# Patient Record
Sex: Male | Born: 1990
Health system: Southern US, Community
[De-identification: ages and names within clinical notes are randomized; demographics above are authoritative.]

## PROBLEM LIST (undated history)

## (undated) DIAGNOSIS — F1910 Other psychoactive substance abuse, uncomplicated: Secondary | ICD-10-CM

## (undated) DIAGNOSIS — I10 Essential (primary) hypertension: Secondary | ICD-10-CM

## (undated) HISTORY — PX: APPENDECTOMY: SHX54

---

## 2013-10-16 ENCOUNTER — Encounter (HOSPITAL_BASED_OUTPATIENT_CLINIC_OR_DEPARTMENT_OTHER): Payer: Self-pay | Admitting: Emergency Medicine

## 2013-10-16 ENCOUNTER — Emergency Department (HOSPITAL_BASED_OUTPATIENT_CLINIC_OR_DEPARTMENT_OTHER)
Admission: EM | Admit: 2013-10-16 | Discharge: 2013-10-16 | Disposition: A | Discharge status: Home / Self Care | Payer: Self-pay | Attending: Emergency Medicine | Admitting: Emergency Medicine

## 2013-10-16 DIAGNOSIS — J069 Acute upper respiratory infection, unspecified: Secondary | ICD-10-CM | POA: Insufficient documentation

## 2013-10-16 DIAGNOSIS — K0889 Other specified disorders of teeth and supporting structures: Secondary | ICD-10-CM

## 2013-10-16 DIAGNOSIS — F17200 Nicotine dependence, unspecified, uncomplicated: Secondary | ICD-10-CM | POA: Insufficient documentation

## 2013-10-16 DIAGNOSIS — K089 Disorder of teeth and supporting structures, unspecified: Secondary | ICD-10-CM | POA: Insufficient documentation

## 2013-10-16 DIAGNOSIS — K002 Abnormalities of size and form of teeth: Secondary | ICD-10-CM | POA: Insufficient documentation

## 2013-10-16 HISTORY — DX: Other psychoactive substance abuse, uncomplicated: F19.10

## 2013-10-16 MED ORDER — NAPROXEN 500 MG PO TABS: 500.0000 mg | ORAL_TABLET | Freq: Two times a day (BID) | ORAL | Status: DC

## 2013-10-16 MED ORDER — PENICILLIN V POTASSIUM 500 MG PO TABS: 500.0000 mg | ORAL_TABLET | Freq: Four times a day (QID) | ORAL | Status: AC

## 2013-10-16 NOTE — ED Notes (Addendum)
Pt c/o dental pain and URI symptoms x 1 week pt is from [facility]

## 2013-10-16 NOTE — ED Provider Notes (Signed)
CSN: [id number]     Arrival date & time 10/16/13  1310 History   First MD Initiated Contact with Patient 10/16/13 1414     Chief Complaint  Patient presents with  . Dental Pain   (Consider location/radiation/quality/duration/timing/severity/associated sxs/prior Treatment) HPI Comments: Patient presents today from [facility] due to dental pain.  He reports that he has also had a cough and nasal congestion.  He reports that he has had left lower dental pain for the past week.  Pain gradually worsening.  He has not taken anything for the pain.  He denies any acute dental injury or trauma.  He denies facial swelling, fever, chills, trismus, or difficulty swallowing.  He reports that he currently does not have a dentist.  Patient also reports that he has had a productive cough and nasal congestion.  These symptoms began earlier today.  He denies fever, chills, SOB, or chest pain.  He reports that he also has not taken anything for these symptoms.  He currently smokes 1ppd.    Patient is a 22 y.o. male presenting with tooth pain. The history is provided by the patient.  Dental Pain   Past Medical History  Diagnosis Date  . Substance abuse    Past Surgical History  Procedure Laterality Date  . Appendectomy     History reviewed. No pertinent family history. History  Substance Use Topics  . Smoking status: Current Every Day Smoker -- 1.00 packs/day    Types: Cigarettes  . Smokeless tobacco: Not on file  . Alcohol Use: Yes     Comment: etoh abuse    Review of Systems  All other systems reviewed and are negative.    Allergies  Review of patient's allergies indicates no known allergies.  Home Medications  No current outpatient prescriptions on file. BP 147/82  Pulse 98  Temp(Src) 98.5 F (36.9 C) (Oral)  Resp 16  SpO2 100% Physical Exam  Nursing note and vitals reviewed. Constitutional: He is oriented to person, place, and time. He appears well-developed and well-nourished.  No distress.  HENT:  Head: Normocephalic and atraumatic.  Mouth/Throat: Uvula is midline, oropharynx is clear and moist and mucous membranes are normal. No trismus in the jaw. Abnormal dentition. No dental abscesses or uvula swelling. No oropharyngeal exudate, posterior oropharyngeal edema, posterior oropharyngeal erythema or tonsillar abscesses.  Poor dental hygiene. Pt able to open and close mouth with out difficulty. Airway intact. Uvula midline. Mild gingival swelling with tenderness over affected area, but no fluctuance. No swelling or tenderness of submental and submandibular regions.  Neck: Normal range of motion and full passive range of motion without pain. Neck supple.  Cardiovascular: Normal rate, regular rhythm and normal heart sounds.   Pulmonary/Chest: Effort normal and breath sounds normal. No stridor. No respiratory distress. He has no wheezes.  Abdominal: Soft. Bowel sounds are normal.  Musculoskeletal: Normal range of motion.  Lymphadenopathy:       Head (right side): No submental, no submandibular, no tonsillar, no preauricular and no posterior auricular adenopathy present.       Head (left side): No submental, no submandibular, no tonsillar, no preauricular and no posterior auricular adenopathy present.    He has no cervical adenopathy.  Neurological: He is alert and oriented to person, place, and time.  Skin: Skin is warm and dry. No rash noted. He is not diaphoretic.  Psychiatric: He has a normal mood and affect.    ED Course  Procedures (including critical care time) Labs Review Labs  Reviewed - No data to display Imaging Review No results found.  EKG Interpretation   None       MDM  No diagnosis found. Patient with toothache.  No gross abscess.  Exam unconcerning for Ludwig's angina or spread of infection.  Will treat with penicillin.  Patient instructed to use Ibuprofen or Tylenol for the pain.   Urged patient to follow-up with dentist.  Patient given dental  referral.  Patient also complaining of symptoms consistent with viral URI.  Patient is afebrile.  Pulse ox 100 on RA.  Lungs CTAB.  Cough just began earlier today.  Therefore, do not feel that CXR is indicated.  Patient stable for discharge.  Return precautions given.   Santiago Glad, PA-C 10/19/13 2020

## 2013-10-22 NOTE — ED Provider Notes (Signed)
Medical screening examination/treatment/procedure(s) were performed by non-physician practitioner and as supervising physician I was immediately available for consultation/collaboration.   Nelia Shi, MD 10/22/13 [phone]

## 2013-11-03 ENCOUNTER — Emergency Department (HOSPITAL_COMMUNITY)
Admission: EM | Admit: 2013-11-03 | Discharge: 2013-11-04 | Disposition: A | Discharge status: Home / Self Care | Payer: Self-pay | Attending: Emergency Medicine | Admitting: Emergency Medicine

## 2013-11-03 ENCOUNTER — Encounter (HOSPITAL_COMMUNITY): Payer: Self-pay | Admitting: Emergency Medicine

## 2013-11-03 DIAGNOSIS — F1910 Other psychoactive substance abuse, uncomplicated: Secondary | ICD-10-CM

## 2013-11-03 DIAGNOSIS — F17200 Nicotine dependence, unspecified, uncomplicated: Secondary | ICD-10-CM | POA: Insufficient documentation

## 2013-11-03 DIAGNOSIS — F1010 Alcohol abuse, uncomplicated: Secondary | ICD-10-CM | POA: Insufficient documentation

## 2013-11-03 DIAGNOSIS — F1410 Cocaine abuse, uncomplicated: Secondary | ICD-10-CM | POA: Insufficient documentation

## 2013-11-03 DIAGNOSIS — Z791 Long term (current) use of non-steroidal anti-inflammatories (NSAID): Secondary | ICD-10-CM | POA: Insufficient documentation

## 2013-11-03 DIAGNOSIS — F1110 Opioid abuse, uncomplicated: Secondary | ICD-10-CM | POA: Insufficient documentation

## 2013-11-03 LAB — COMPREHENSIVE METABOLIC PANEL
ALT: 136 U/L — ABNORMAL HIGH (ref 0–53)
AST: 63 U/L — ABNORMAL HIGH (ref 0–37)
Alkaline Phosphatase: 64 U/L (ref 39–117)
CO2: 24 mEq/L (ref 19–32)
GFR calc Af Amer: >90 mL/min (ref >90)
GFR calc non Af Amer: >90 mL/min (ref >90)
Glucose, Bld: 97 mg/dL (ref 70–99)
Potassium: 4.3 mEq/L (ref 3.7–5.3)
Sodium: 140 mEq/L (ref 137–147)
Total Protein: 7.7 g/dL (ref 6.0–8.3)

## 2013-11-03 LAB — CBC
Hemoglobin: 15.4 g/dL (ref 13.0–17.0)
MCHC: 33.9 g/dL (ref 30.0–36.0)
Platelets: 294 10*3/uL (ref 150–400)
RBC: 4.92 MIL/uL (ref 4.22–5.81)

## 2013-11-03 LAB — RAPID URINE DRUG SCREEN, HOSP PERFORMED
Amphetamines: NOT DETECTED
Barbiturates: NOT DETECTED
Cocaine: NOT DETECTED
Tetrahydrocannabinol: NOT DETECTED

## 2013-11-03 LAB — ACETAMINOPHEN LEVEL: Acetaminophen (Tylenol), Serum: <15 ug/mL (ref 10–30)

## 2013-11-03 NOTE — ED Notes (Signed)
Pt requesting detox from heroin, cocaine, etoh and benzos, relapsed this week after 60 days of being clean

## 2013-11-04 ENCOUNTER — Inpatient Hospital Stay (HOSPITAL_COMMUNITY)
Admission: EM | Admit: 2013-11-04 | Discharge: 2013-11-06 | DRG: 897 | Disposition: A | Discharge status: Home / Self Care | Payer: Federal, State, Local not specified - Other | Source: Intra-hospital | Attending: Psychiatry | Admitting: Psychiatry

## 2013-11-04 ENCOUNTER — Encounter (HOSPITAL_COMMUNITY): Payer: Self-pay | Admitting: *Deleted

## 2013-11-04 ENCOUNTER — Encounter (HOSPITAL_COMMUNITY): Payer: Self-pay

## 2013-11-04 DIAGNOSIS — Z79899 Other long term (current) drug therapy: Secondary | ICD-10-CM

## 2013-11-04 DIAGNOSIS — Z9289 Personal history of other medical treatment: Secondary | ICD-10-CM

## 2013-11-04 DIAGNOSIS — F1120 Opioid dependence, uncomplicated: Principal | ICD-10-CM | POA: Diagnosis present

## 2013-11-04 DIAGNOSIS — F4310 Post-traumatic stress disorder, unspecified: Secondary | ICD-10-CM | POA: Diagnosis present

## 2013-11-04 DIAGNOSIS — F3289 Other specified depressive episodes: Secondary | ICD-10-CM | POA: Diagnosis present

## 2013-11-04 DIAGNOSIS — F329 Major depressive disorder, single episode, unspecified: Secondary | ICD-10-CM

## 2013-11-04 MED ORDER — CLONIDINE HCL 0.1 MG PO TABS: 0.1000 mg | ORAL_TABLET | Freq: Every day | ORAL | Status: DC

## 2013-11-04 MED ORDER — NICOTINE 21 MG/24HR TD PT24
21.0000 mg | MEDICATED_PATCH | Freq: Every day | TRANSDERMAL | Status: DC
  Administered 2013-11-04 – 2013-11-06 (×3): 21 mg via TRANSDERMAL
  Filled 2013-11-04 (×6): qty 1

## 2013-11-04 MED ORDER — CHLORDIAZEPOXIDE HCL 25 MG PO CAPS
25.0000 mg | ORAL_CAPSULE | Freq: Four times a day (QID) | ORAL | Status: DC | PRN
  Administered 2013-11-04: 25 mg via ORAL
  Filled 2013-11-04: qty 1

## 2013-11-04 MED ORDER — LOPERAMIDE HCL 2 MG PO CAPS: 2.0000 mg | ORAL_CAPSULE | ORAL | Status: DC | PRN

## 2013-11-04 MED ORDER — PNEUMOCOCCAL VAC POLYVALENT 25 MCG/0.5ML IJ INJ
0.5000 mL | INJECTION | INTRAMUSCULAR | Status: AC
  Administered 2013-11-05: 0.5 mL via INTRAMUSCULAR

## 2013-11-04 MED ORDER — ONDANSETRON 4 MG PO TBDP: 4.0000 mg | ORAL_TABLET | Freq: Four times a day (QID) | ORAL | Status: DC | PRN

## 2013-11-04 MED ORDER — NAPROXEN 500 MG PO TABS: 500.0000 mg | ORAL_TABLET | Freq: Two times a day (BID) | ORAL | Status: DC | PRN

## 2013-11-04 MED ORDER — METHOCARBAMOL 500 MG PO TABS: 500.0000 mg | ORAL_TABLET | Freq: Three times a day (TID) | ORAL | Status: DC | PRN

## 2013-11-04 MED ORDER — LORAZEPAM 1 MG PO TABS: 0.0000 mg | ORAL_TABLET | Freq: Four times a day (QID) | ORAL | Status: DC

## 2013-11-04 MED ORDER — CLONIDINE HCL 0.1 MG PO TABS: 0.1000 mg | ORAL_TABLET | ORAL | Status: DC

## 2013-11-04 MED ORDER — MAGNESIUM HYDROXIDE 400 MG/5ML PO SUSP: 30.0000 mL | Freq: Every day | ORAL | Status: DC | PRN

## 2013-11-04 MED ORDER — CHLORDIAZEPOXIDE HCL 25 MG PO CAPS
25.0000 mg | ORAL_CAPSULE | Freq: Once | ORAL | Status: AC
Start: 2013-11-04 — End: 2013-11-04
  Administered 2013-11-04: 25 mg via ORAL
  Filled 2013-11-04: qty 1

## 2013-11-04 MED ORDER — LOPERAMIDE HCL 2 MG PO CAPS
2.0000 mg | ORAL_CAPSULE | ORAL | Status: DC | PRN
  Administered 2013-11-06: 4 mg via ORAL
  Filled 2013-11-04: qty 2

## 2013-11-04 MED ORDER — LORAZEPAM 1 MG PO TABS: 0.0000 mg | ORAL_TABLET | Freq: Two times a day (BID) | ORAL | Status: DC

## 2013-11-04 MED ORDER — DICYCLOMINE HCL 20 MG PO TABS
20.0000 mg | ORAL_TABLET | Freq: Four times a day (QID) | ORAL | Status: DC | PRN
  Administered 2013-11-06: 20 mg via ORAL
  Filled 2013-11-04: qty 1

## 2013-11-04 MED ORDER — ALUM & MAG HYDROXIDE-SIMETH 200-200-20 MG/5ML PO SUSP: 30.0000 mL | ORAL | Status: DC | PRN

## 2013-11-04 MED ORDER — INFLUENZA VAC SPLIT QUAD 0.5 ML IM SUSP
0.5000 mL | INTRAMUSCULAR | Status: DC
  Filled 2013-11-04: qty 0.5

## 2013-11-04 MED ORDER — ADULT MULTIVITAMIN W/MINERALS CH
1.0000 | ORAL_TABLET | Freq: Every day | ORAL | Status: DC
  Administered 2013-11-04 – 2013-11-06 (×3): 1 via ORAL
  Filled 2013-11-04 (×4): qty 1

## 2013-11-04 MED ORDER — HYDROXYZINE HCL 25 MG PO TABS: 25.0000 mg | ORAL_TABLET | Freq: Four times a day (QID) | ORAL | Status: DC | PRN

## 2013-11-04 MED ORDER — TRAZODONE HCL 50 MG PO TABS: 50.0000 mg | ORAL_TABLET | Freq: Every evening | ORAL | Status: DC | PRN

## 2013-11-04 MED ORDER — VITAMIN B-1 100 MG PO TABS
100.0000 mg | ORAL_TABLET | Freq: Every day | ORAL | Status: DC
  Administered 2013-11-05 – 2013-11-06 (×2): 100 mg via ORAL
  Filled 2013-11-04 (×3): qty 1

## 2013-11-04 MED ORDER — THIAMINE HCL 100 MG/ML IJ SOLN
100.0000 mg | Freq: Once | INTRAMUSCULAR | Status: AC
  Administered 2013-11-04: 100 mg via INTRAMUSCULAR
  Filled 2013-11-04: qty 2

## 2013-11-04 MED ORDER — CLONIDINE HCL 0.1 MG PO TABS: 0.1000 mg | ORAL_TABLET | Freq: Four times a day (QID) | ORAL | Status: DC

## 2013-11-04 MED ORDER — METHOCARBAMOL 500 MG PO TABS
500.0000 mg | ORAL_TABLET | Freq: Three times a day (TID) | ORAL | Status: DC | PRN
  Administered 2013-11-04: 500 mg via ORAL
  Filled 2013-11-04: qty 1

## 2013-11-04 MED ORDER — CLONIDINE HCL 0.1 MG PO TABS
0.1000 mg | ORAL_TABLET | ORAL | Status: DC
  Filled 2013-11-04: qty 1

## 2013-11-04 MED ORDER — HYDROXYZINE HCL 25 MG PO TABS
25.0000 mg | ORAL_TABLET | Freq: Four times a day (QID) | ORAL | Status: DC | PRN
  Administered 2013-11-04 – 2013-11-06 (×3): 25 mg via ORAL
  Filled 2013-11-04 (×3): qty 1

## 2013-11-04 MED ORDER — DULOXETINE HCL 20 MG PO CPEP
20.0000 mg | ORAL_CAPSULE | Freq: Every day | ORAL | Status: DC
  Administered 2013-11-04 – 2013-11-05 (×2): 20 mg via ORAL
  Filled 2013-11-04 (×5): qty 1

## 2013-11-04 MED ORDER — CLONIDINE HCL 0.1 MG PO TABS
0.1000 mg | ORAL_TABLET | Freq: Four times a day (QID) | ORAL | Status: DC
  Administered 2013-11-04 – 2013-11-05 (×4): 0.1 mg via ORAL
  Filled 2013-11-04 (×9): qty 1

## 2013-11-04 MED ORDER — ACETAMINOPHEN 325 MG PO TABS: 650.0000 mg | ORAL_TABLET | Freq: Four times a day (QID) | ORAL | Status: DC | PRN

## 2013-11-04 MED ORDER — ZOLPIDEM TARTRATE 10 MG PO TABS
10.0000 mg | ORAL_TABLET | Freq: Every evening | ORAL | Status: DC | PRN
  Administered 2013-11-04 – 2013-11-05 (×2): 10 mg via ORAL
  Filled 2013-11-04 (×2): qty 1

## 2013-11-04 MED ORDER — GABAPENTIN 100 MG PO CAPS
100.0000 mg | ORAL_CAPSULE | Freq: Three times a day (TID) | ORAL | Status: DC
  Administered 2013-11-04 – 2013-11-05 (×3): 100 mg via ORAL
  Filled 2013-11-04 (×7): qty 1

## 2013-11-04 MED ORDER — DICYCLOMINE HCL 20 MG PO TABS: 20.0000 mg | ORAL_TABLET | Freq: Four times a day (QID) | ORAL | Status: DC | PRN

## 2013-11-04 MED ORDER — CHLORDIAZEPOXIDE HCL 25 MG PO CAPS
25.0000 mg | ORAL_CAPSULE | Freq: Four times a day (QID) | ORAL | Status: DC | PRN
  Administered 2013-11-05 – 2013-11-06 (×3): 25 mg via ORAL
  Filled 2013-11-04 (×3): qty 1

## 2013-11-04 MED ORDER — NICOTINE 21 MG/24HR TD PT24
21.0000 mg | MEDICATED_PATCH | Freq: Every day | TRANSDERMAL | Status: DC
  Administered 2013-11-04: 21 mg via TRANSDERMAL
  Filled 2013-11-04: qty 1

## 2013-11-04 NOTE — Tx Team (Signed)
Initial Interdisciplinary Treatment Plan  PATIENT STRENGTHS: (choose at least two) General fund of knowledge  PATIENT STRESSORS: Substance abuse   PROBLEM LIST: Problem List/Patient Goals Date to be addressed Date deferred Reason deferred Estimated date of resolution  SA 11/04/13     Risk for suicide 11/04/13     Depression 11/04/13                                          DISCHARGE CRITERIA:  Withdrawal symptoms are absent or subacute and managed without 24-hour nursing intervention  PRELIMINARY DISCHARGE PLAN: Attend aftercare/continuing care group Attend PHP/IOP Attend 12-step recovery group  PATIENT/FAMIILY INVOLVEMENT: This treatment plan has been presented to and reviewed with the patient, Bryan Jennings.  The patient and family have been given the opportunity to ask questions and make suggestions.  Jacques Navy A 11/04/2013, 5:38 AM

## 2013-11-04 NOTE — [facility] Suicide Risk Assessment (Signed)
[facility] INPATIENT:  Family/Significant Other Suicide Prevention Education  Suicide Prevention Education:  Education Completed; Burwell Bethel (pt's brother) [phone] has been identified by the patient as the family member/significant other with whom the patient will be residing, and identified as the person(s) who will aid the patient in the event of a mental health crisis (suicidal ideations/suicide attempt).  With written consent from the patient, the family member/significant other has been provided the following suicide prevention education, prior to the and/or following the discharge of the patient.  The suicide prevention education provided includes the following:  Suicide risk factors  Suicide prevention and interventions  National Suicide Hotline telephone number  [facility] assessment telephone number  [facility] Emergency Assistance 911  [facility] and/or Residential Mobile Crisis Unit telephone number  Request made of family/significant other to:  Remove weapons (e.g., guns, rifles, knives), all items previously/currently identified as safety concern.    Remove drugs/medications (over-the-counter, prescriptions, illicit drugs), all items previously/currently identified as a safety concern.  The family member/significant other verbalizes understanding of the suicide prevention education information provided.  The family member/significant other agrees to remove the items of safety concern listed above.  Smart, HeatherLCSWA  11/04/2013, 1:05 PM

## 2013-11-04 NOTE — [facility] Group Notes (Signed)
[facility] LCSW Group Therapy  11/04/2013 3:11 PM  Type of Therapy:  Group Therapy  Participation Level:  Minimal  Participation Quality:  Attentive  Affect:  Depressed and Flat  Cognitive:  Alert  Insight:  Improving  Engagement in Therapy:  Improving  Modes of Intervention:  Confrontation, Discussion, Education, Exploration, Socialization and Support  Summary of Progress/Problems: Emotion Regulation: This group focused on both positive and negative emotion identification and allowed group members to process ways to identify feelings, regulate negative emotions, and find healthy ways to manage internal/external emotions. Group members were asked to reflect on a time when their reaction to an emotion led to a negative outcome and explored how alternative responses using emotion regulation would have benefited them. Group members were also asked to discuss a time when emotion regulation was utilized when a negative emotion was experienced. Bryan Jennings was attentive and engaged throughout today's therapy group. He stated that he has a difficult time dealing with "past traumas and anger toward family members." Bryan Jennings shared that he numbs his emotions with drugs and alcohol but was trying to get clean until he found out that his fiance was prostituting herself online. Bryan Jennings shared that he is ready to "surrender to my higher power and be accepting of help." Bryan Jennings shows progress in the group setting AEB his ability to process how his problems involving emotion regulation fuel his substance abuse and long term problems with relationships/self.    Smart, HeatherLCSWA  11/04/2013, 3:11 PM

## 2013-11-04 NOTE — [facility] Suicide Risk Assessment (Signed)
Suicide Risk Assessment  Admission Assessment     Nursing information obtained from:    Demographic factors:    Current Mental Status:    Loss Factors:    Historical Factors:    Risk Reduction Factors:     CLINICAL FACTORS:   Depression:   Comorbid alcohol abuse/dependence Alcohol/Substance Abuse/Dependencies  COGNITIVE FEATURES THAT CONTRIBUTE TO RISK:  Closed-mindedness Polarized thinking Thought constriction (tunnel vision)    SUICIDE RISK:   Moderate:  Frequent suicidal ideation with limited intensity, and duration, some specificity in terms of plans, no associated intent, good self-control, limited dysphoria/symptomatology, some risk factors present, and identifiable protective factors, including available and accessible social support.  PLAN OF CARE: Supportive approach/coping skills/relapse prevention                              Detox as needed                              CBT;mindfulness                              Reassess and address the co morbidities  I certify that inpatient services furnished can reasonably be expected to improve the patient's condition.  Saloma Cadena A 11/04/2013, 4:19 PM

## 2013-11-04 NOTE — ED Provider Notes (Signed)
CSN: [id number]     Arrival date & time 11/03/13  2155 History   First MD Initiated Contact with Patient 11/03/13 2323     Chief Complaint  Patient presents with  . Medical Clearance   HPI  History provided by the patient. Patient is a 22 year old male who presents with requests for help with detox from alcohol, heroin and cocaine. Patient has had issues with drug addictions recently going through rehabilitation treatments. He states he was clean for 2 months but relapsed this past week. He reports using and drinking alcohol every day. He last used IV heroine early in the morning. Has been having alcohol throughout the day. He denies any other complaints. Denies SI or HI. No other aggravating or alleviating factors. No other associated symptoms.    Past Medical History  Diagnosis Date  . Substance abuse    Past Surgical History  Procedure Laterality Date  . Appendectomy     History reviewed. No pertinent family history. History  Substance Use Topics  . Smoking status: Current Every Day Smoker -- 1.00 packs/day    Types: Cigarettes  . Smokeless tobacco: Not on file  . Alcohol Use: Yes     Comment: etoh abuse    Review of Systems  All other systems reviewed and are negative.    Allergies  Review of patient's allergies indicates no known allergies.  Home Medications   Current Outpatient Rx  Name  Route  Sig  Dispense  Refill  . naproxen (NAPROSYN) 500 MG tablet   Oral   Take 1 tablet (500 mg total) by mouth 2 (two) times daily.   30 tablet   0    BP 150/97  Pulse 102  Temp(Src) 97.7 F (36.5 C) (Oral)  Resp 20  Ht 5\' 10"  (1.778 m)  Wt 245 lb (111.131 kg)  BMI 35.15 kg/m2  SpO2 97% Physical Exam  Nursing note and vitals reviewed. Constitutional: He is oriented to person, place, and time. He appears well-developed and well-nourished. No distress.  HENT:  Head: Normocephalic.  Eyes: Conjunctivae are normal. Pupils are equal, round, and reactive to light.   Cardiovascular: Normal rate and regular rhythm.   Pulmonary/Chest: Effort normal and breath sounds normal. No respiratory distress. He has no wheezes.  Abdominal: Soft.  Neurological: He is alert and oriented to person, place, and time.  Skin: Skin is warm.    ED Course  Procedures   DIAGNOSTIC STUDIES: Oxygen Saturation is 97% on RA.    COORDINATION OF CARE:  Nursing notes reviewed. Vital signs reviewed. Initial pt interview and examination performed.   2:51 AM-patient seen and evaluated. Patient appears well no acute distress. Discussed work up plan with pt at bedside, which includes medical clearance and TTS consultation. Pt agrees with plan.  Patient is medically cleared for further evaluation by TTS.   Patient has been evaluated by TTS and is accepted over at [facility]   Results for orders placed during the hospital encounter of 11/03/13  ACETAMINOPHEN LEVEL      Result Value Range   Acetaminophen (Tylenol), Serum <15.0  10 - 30 ug/mL  CBC      Result Value Range   WBC 9.7  4.0 - 10.5 K/uL   RBC 4.92  4.22 - 5.81 MIL/uL   Hemoglobin 15.4  13.0 - 17.0 g/dL   HCT 40.3  47.4 - 25.9 %   MCV 92.3  78.0 - 100.0 fL   MCH 31.3  26.0 - 34.0 pg  MCHC 33.9  30.0 - 36.0 g/dL   RDW 16.1  09.6 - 04.5 %   Platelets 294  150 - 400 K/uL  COMPREHENSIVE METABOLIC PANEL      Result Value Range   Sodium 140  137 - 147 mEq/L   Potassium 4.3  3.7 - 5.3 mEq/L   Chloride 103  96 - 112 mEq/L   CO2 24  19 - 32 mEq/L   Glucose, Bld 97  70 - 99 mg/dL   BUN 10  6 - 23 mg/dL   Creatinine, Ser 4.09  0.50 - 1.35 mg/dL   Calcium 9.4  8.4 - 81.1 mg/dL   Total Protein 7.7  6.0 - 8.3 g/dL   Albumin 4.3  3.5 - 5.2 g/dL   AST 63 (*) 0 - 37 U/L   ALT 136 (*) 0 - 53 U/L   Alkaline Phosphatase 64  39 - 117 U/L   Total Bilirubin 0.6  0.3 - 1.2 mg/dL   GFR calc non Af Amer >90  >90 mL/min   GFR calc Af Amer >90  >90 mL/min  ETHANOL      Result Value Range   Alcohol, Ethyl (B) <11  0 - 11 mg/dL   SALICYLATE LEVEL      Result Value Range   Salicylate Lvl <2.0 (*) 2.8 - 20.0 mg/dL  URINE RAPID DRUG SCREEN (HOSP PERFORMED)      Result Value Range   Opiates POSITIVE (*) NONE DETECTED   Cocaine NONE DETECTED  NONE DETECTED   Benzodiazepines POSITIVE (*) NONE DETECTED   Amphetamines NONE DETECTED  NONE DETECTED   Tetrahydrocannabinol NONE DETECTED  NONE DETECTED   Barbiturates NONE DETECTED  NONE DETECTED     MDM   1. Polysubstance abuse        Angus Seller, [place] 11/04/13 [phone]

## 2013-11-04 NOTE — Clinical Social Work Note (Signed)
CSW met with Bryan Jennings individually. Pt stated that he is certain that he will be going back to [facility] on Tuesday. Pt made aware of possibility that he will be d/ced prior to Tuesday. Pt given shelter numbers and list of other [place] [facility]. Pt stated that he may be able to stay with friend/family member until Tuesday. He plans to go to [facility] for med management.   [employer], LCSWA 11/04/2013 3:36 PM

## 2013-11-04 NOTE — ED Notes (Signed)
No acute distress noted.

## 2013-11-04 NOTE — [facility] Group Notes (Signed)
[facility] LCSW Aftercare Discharge Planning Group Note   11/04/2013 9:33 AM  Participation Quality:  Appropriate   Mood/Affect:  Appropriate  Depression Rating:  7  Anxiety Rating:  10  Thoughts of Suicide:  No Will you contract for safety?   NA  Current AVH:  No  Plan for Discharge/Comments:  Pt reports that he recently completed treatment at [facility] but relapsed after finding out that his fiance was prostituting herself online. Pt reports that he will return to [facility] at d/c (he is able to return on Tuesday of next week). He plans to follow up at [facility] for med management.   Transportation Means: bus   Supports: family supports identified.   Smart, HeatherLCSWA

## 2013-11-04 NOTE — Tx Team (Signed)
Interdisciplinary Treatment Plan Update (Adult)  Date: 11/04/2013   Time Reviewed:12:56 PM  Progress in Treatment:  Attending groups: Yes.  Participating in groups:  Yes.  Taking medication as prescribed: Yes  Tolerating medication: Yes  Family/Significant othe contact made: Not yet. [name] required for this pt.   Patient understands diagnosis: Yes, AEB seeking treatment for SI, depression, ETOH detox/cocaine/benzo abuse/heroin abuse/detox.  Discussing patient identified problems/goals with staff: Yes  Medical problems stabilized or resolved: Yes  Denies suicidal/homicidal ideation: Yes during group/self report.  Patient has not harmed self or Others: Yes  New problem(s) identified:  Discharge Plan or Barriers: Pt plans to return to [facility] on Tuesday and follow up at [facility].  Additional comments:  Bryan Jennings is a 22 y.o. male who initially presented to [facility] requesting alcohol, cocaine, heroin, and benzos(xanax) detox. Pt states he relapsed on 11/01/13(Sunday) after being clean for 60 days. Pt had a recent 90 rehab admission with [facility] and was d/c'd on 10/27/13. Pt says he relapsed because he discovered his fiance was using "backpage"(advertising website for job listings, real estate, adult entertainment, etc) and pt became upset and immediately started using drugs--heroin, 1/2 gram, daily x3 days, last use was 11/03/13, pt used 1/2 gram; cocaine, daily x3 days, last use was 11/03/13, pt used $40 worth of the substance; alcohol, 12 beers, daily, last use was 11/03/13, pt drank 12 beers and pt uses 10-12 xanax pills, daily, last use was 11/03/13, pt used 6 pills. This Clinical research associate explained to pt that detox is not avail due to the length of use and pt.'s recent d/c from [facility] 1 week ago. Pt states that he was residing at the [facility] and had to leave because of his drug use, he was told he could return in 1 week after d/c from detox program. Pt cannot return to [facility] until  a 90 day absence for any additional treatment. Reason for Continuation of Hospitalization: Clonidine taper-withdrawals Mood stabilization Medication management  Estimated length of stay: 2-4 days  For review of initial/current patient goals, please see plan of care.  Attendees:  Patient:    Family:    Physician: Geoffery Lyons MD 11/04/2013 12:51 PM   Nursing: Lupita Leash RN   11/04/2013 12:51 PM   Clinical Social Worker Farra Nikolic Smart, LCSWA  11/04/2013 12:51 PM   Other: Maureen Ralphs RN  11/04/2013 12:51 PM   Other: Chandra Batch. PA 11/04/2013 12:51 PM   Other: Darden Dates Nurse CM  11/04/2013 12:51 PM   Other:    Scribe for Treatment Team:  Trula Slade LCSWA 11/04/2013 12:55 PM

## 2013-11-04 NOTE — Progress Notes (Signed)
D: Patient denies SI/HI and auditory and visual hallucinations. The patient has a depressed mood and affect. The patient open during interactions with staff. The patient states that is he is "doing OK". He reports sleep as fair, appetite good, energy level low, and ability to pay attention improving.  No withdrawal symptoms voices or notes.He rates depression at 7/ anxiety at 10/ and hopelessness at 10. (1 best).   A: Patient given emotional support from RN. Patient encouraged to come to staff with concerns and/or questions. Patient's medication routine continued. Patient's orders and plan of care reviewed.  R: Patient remains appropriate and cooperative but depressed. Patient denies any concerns or needs at this time. Will continue to monitor patient q15 minutes for safety

## 2013-11-04 NOTE — Progress Notes (Signed)
Patient ID: Bryan Jennings, male   DOB: Jul 31, 1991, 22 y.o.   MRN: [id number] Patients' were encouraged to reflect on 2014 and consider what they want to do differently in 2015. This patient stated: I plan to practice serenity and attend meetings.

## 2013-11-04 NOTE — Progress Notes (Signed)
Nutrition Brief Note  Patient identified on the Malnutrition Screening Tool (MST) Report.  Wt Readings from Last 10 Encounters:  11/04/13 239 lb (108.41 kg)  11/03/13 245 lb (111.131 kg)   Body mass index is 33.8 kg/(m^2). Patient meets criteria for class I obesity based on current BMI.   Discussed intake PTA with patient and compared to intake presently.  Discussed changes in intake, if any, and encouraged adequate intake of meals and snacks.  Current diet order is regular and pt is also offered choice of unit snacks mid-morning and mid-afternoon.  Pt is eating as desired.   Labs and medications reviewed. AST/ALT elevated.   Admitted with requests for help with detox from alcohol, heroin, an cocaine. Met with pt who reports eating well at [facility] but for the past week after he left he was eating poorly, only 1 meal/day due to not being hungry. States he lost 6 pounds in the past week. Reports eating excellent during admission. Plans on getting back on regular meal schedule after d/c as he reports he was getting light headed from eating so little PTA. Denies needing any nutritional supplements at this time.   Nutrition Dx:  Unintended wt change r/t suboptimal oral intake AEB pt report  Interventions:   Discussed the importance of nutrition and encouraged intake of food and beverages.     No additional nutrition interventions warranted at this time. If nutrition issues arise, please consult RD.   Levon Hedger MS, RD, LDN [phone] Pager [phone] After Hours Pager
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
## 2013-11-04 NOTE — Progress Notes (Signed)
Patient ID: Bryan Jennings, male   DOB: 1991-01-02, 22 y.o.   MRN: [id number] Pt attended Pharmacy group.

## 2013-11-04 NOTE — BH Assessment (Signed)
Assessment Note  Bryan Jennings is a 22 y.o. male who initially presented to [facility] requesting alcohol, cocaine, heroin, and benzos(xanax) detox.  Pt states he relapsed on 11/01/13(Sunday) after being clean for 60 days.  Pt had a recent 90 rehab admission with [facility] and was d/c'd on 10/27/13.  Pt says he relapsed because he discovered his fiance was using "backpage"(advertising website for job listings, real estate, adult entertainment, etc) and pt became upset and immediately started using drugs--heroin, 1/2 gram, daily x3 days, last use was 11/03/13, pt used 1/2 gram; cocaine, daily x3 days, last use was 11/03/13, pt used $40 worth of the substance; alcohol, 12 beers, daily, last use was 11/03/13, pt drank 12 beers and pt uses 10-12 xanax pills, daily, last use was 11/03/13, pt used 6 pills.  This Clinical research associate explained to pt that detox is not avail due to the length of use and pt.'s recent d/c from [facility] 1 week ago.  Pt states that he was residing at the [facility] and had to leave because of his drug use, he was told he could return in 1 week after d/c from detox program.  Pt cannot return to [facility] until a 90 day absence for any additional treatment.  Pt states he has nowhere to go and told this Clinical research associate that he was SI with plan to overdose. Pt has no w/d sxs.  Pt accepted by Donell Sievert, PA--304-2.    Axis I: Mood Disorder NOS and Opioid Abuse, Cocaine ABuse, Alcohol Abuse and Sedative, Hypnotic, or Anxiolytic Abuse  Axis II: Deferred Axis III:  Past Medical History  Diagnosis Date  . Substance abuse    Axis IV: other psychosocial or environmental problems, problems related to social environment and problems with primary support group Axis V: 41-50 serious symptoms  Past Medical History:  Past Medical History  Diagnosis Date  . Substance abuse     Past Surgical History  Procedure Laterality Date  . Appendectomy      Family History: History reviewed. No pertinent family history.  Social  History:  reports that he has been smoking Cigarettes.  He has been smoking about 1.00 pack per day. He does not have any smokeless tobacco history on file. He reports that he drinks alcohol. He reports that he uses illicit drugs (Cocaine, Heroin, and Benzodiazepines).  Additional Social History:  Alcohol / Drug Use Pain Medications: None  Prescriptions: None  Over the Counter: None Longest period of sobriety (when/how long): Recent 90 day rehab with [facility]; d/c'd on 10/27/13 Negative Consequences of Use: Personal relationships;Work / Programmer, multimedia Withdrawal Symptoms: Other (Comment) (No current w/d sxs )  CIWA: CIWA-Ar BP: 150/97 mmHg Pulse Rate: 102 COWS:    Allergies: No Known Allergies  Home Medications:  (Not in a hospital admission)  OB/GYN Status:  No LMP for male patient.  General Assessment Data Location of Assessment: [facility] ED Is this a Tele or Face-to-Face Assessment?: Tele Assessment Is this an Initial Assessment or a Re-assessment for this encounter?: Initial Assessment Living Arrangements: Other (Comment) (Pt was residing with [facility] until 11/03/13) Can pt return to current living arrangement?: Yes Admission Status: Voluntary Is patient capable of signing voluntary admission?: Yes Transfer from: Acute Hospital Referral Source: MD  Medical Screening Exam [facility] Walk-in ONLY) Medical Exam completed: No Reason for MSE not completed: Other: (None )  [facility] Crisis Care Plan Living Arrangements: Other (Comment) (Pt was residing with [facility] until 11/03/13) Name of Psychiatrist: None  Name of Therapist: None  Education Status Is patient currently in school?: No Current Grade: None  Highest grade of school patient has completed: None  Name of school: None  Contact person: None   Risk to self Suicidal Ideation: Yes-Currently Present Suicidal Intent: No Is patient at risk for suicide?: No Suicidal Plan?: Yes-Currently Present Specify Current Suicidal Plan:  Overdose  Access to Means: Yes Specify Access to Suicidal Means: Pills  What has been your use of drugs/alcohol within the last 12 months?: Abusing: heroin, cocaine, alcohol, benzos  Previous Attempts/Gestures: No How many times?: 0 Other Self Harm Risks: None  Triggers for Past Attempts: None known Intentional Self Injurious Behavior: None Family Suicide History: No Recent stressful life event(s): Other (Comment) (Recent relapse on drugs on 10/25/13) Persecutory voices/beliefs?: No Depression: Yes Depression Symptoms: Guilt Substance abuse history and/or treatment for substance abuse?: Yes Suicide prevention information given to non-admitted patients: Not applicable  Risk to Others Homicidal Ideation: No Thoughts of Harm to Others: No Current Homicidal Intent: No Current Homicidal Plan: No Access to Homicidal Means: No Identified Victim: None  History of harm to others?: No Assessment of Violence: None Noted Violent Behavior Description: None  Does patient have access to weapons?: No Criminal Charges Pending?: No Does patient have a court date: No  Psychosis Hallucinations: None noted Delusions: None noted  Mental Status Report Appear/Hygiene: Other (Comment) (Appropriate ) Eye Contact: Fair Motor Activity: Unremarkable Speech: Logical/coherent Level of Consciousness: Alert Mood: Sad Affect: Sad Anxiety Level: None Thought Processes: Coherent;Relevant Judgement: Impaired Orientation: Person;Place;Time;Situation Obsessive Compulsive Thoughts/Behaviors: None  Cognitive Functioning Concentration: Normal Memory: Recent Intact;Remote Intact IQ: Average Insight: Fair Impulse Control: Fair Appetite: Good Weight Loss: 0 Weight Gain: 0 Sleep: No Change Total Hours of Sleep: 6 Vegetative Symptoms: None  ADLScreening [facility] Assessment Services) Patient's cognitive ability adequate to safely complete daily activities?: Yes Patient able to express need for assistance  with ADLs?: Yes Independently performs ADLs?: Yes (appropriate for developmental age)  Prior Inpatient Therapy Prior Inpatient Therapy: Yes Prior Therapy Dates: 2014 Prior Therapy Facilty/Provider(s): [facility]  Reason for Treatment: Rehab/detox   Prior Outpatient Therapy Prior Outpatient Therapy: No Prior Therapy Dates: None  Prior Therapy Facilty/Provider(s): None  Reason for Treatment: None   ADL Screening (condition at time of admission) Patient's cognitive ability adequate to safely complete daily activities?: Yes Is the patient deaf or have difficulty hearing?: No Does the patient have difficulty seeing, even when wearing glasses/contacts?: No Does the patient have difficulty concentrating, remembering, or making decisions?: No Patient able to express need for assistance with ADLs?: Yes Does the patient have difficulty dressing or bathing?: No Independently performs ADLs?: Yes (appropriate for developmental age) Does the patient have difficulty walking or climbing stairs?: No Weakness of Legs: None Weakness of Arms/Hands: None  Home Assistive Devices/Equipment Home Assistive Devices/Equipment: None  Therapy Consults (therapy consults require a physician order) PT Evaluation Needed: No OT Evalulation Needed: No SLP Evaluation Needed: No Abuse/Neglect Assessment (Assessment to be complete while patient is alone) Physical Abuse: Denies Verbal Abuse: Denies Sexual Abuse: Denies Exploitation of patient/patient's resources: Denies Self-Neglect: Denies Values / Beliefs Cultural Requests During Hospitalization: None Spiritual Requests During Hospitalization: None Consults Spiritual Care Consult Needed: No Social Work Consult Needed: No Merchant navy officer (For Healthcare) Advance Directive: Patient does not have advance directive;Patient would not like information Pre-existing out of facility DNR order (yellow form or pink MOST form): No Nutrition Screen- [facility]  Adult/[facility] Patient's home diet: Regular  Additional Information 1:1 In Past 12 Months?: No CIRT  Risk: No Elopement Risk: No Does patient have medical clearance?: Yes     Disposition:  Disposition Initial Assessment Completed for this Encounter: Yes Disposition of Patient: Inpatient treatment program;Referred to [facility] ) Type of inpatient treatment program: Adult Patient referred to: Other (Comment) [facility] )  On Site Evaluation by:   Reviewed with Physician:    Murrell Redden 11/04/2013 2:35 AM
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
## 2013-11-04 NOTE — Progress Notes (Signed)
Patient ID: Bryan Jennings, male   DOB: 12-26-1990, 22 y.o.   MRN: [id number]  Admission Note:  D:22 yr male who presents VC in no acute distress for the treatment of SA/ ETOH Detox,  SI and Depression. Pt appears flat and depressed. Pt was calm and cooperative with admission process. Pt presents with passive SI and contracts for safety upon admission. Pt denies AVH .  A:Skin was assessed and found to be clear of any abnormal marks apart from a scar on L/R forearms from drug use, Rash on chest, birthmark on R eye, Tattoos on L-arm R-shoulder back. POC and unit policies explained and understanding verbalized. Consents obtained. Food and fluids offered, and  Accepted.  R: Pt had no additional questions or concerns.

## 2013-11-04 NOTE — ED Notes (Signed)
[employer] contacted. 

## 2013-11-04 NOTE — ED Notes (Signed)
Pt with 2 belonging bags consist of black hoodie jacket, white tshirt, white tank top tshirt, blue jeans, brown belt, white socks, black sneakers, black cap, 1 ring, 2 bracelts,and  black boxers.  Keys and 1 cig in [place]

## 2013-11-05 LAB — HEPATITIS PANEL, ACUTE
HCV Ab: REACTIVE — AB
Hep A IgM: NONREACTIVE
Hep B C IgM: NONREACTIVE
Hepatitis B Surface Ag: NEGATIVE

## 2013-11-05 MED ORDER — GABAPENTIN 300 MG PO CAPS
300.0000 mg | ORAL_CAPSULE | Freq: Three times a day (TID) | ORAL | Status: DC
  Administered 2013-11-05 – 2013-11-06 (×3): 300 mg via ORAL
  Filled 2013-11-05 (×6): qty 1

## 2013-11-05 MED ORDER — DULOXETINE HCL 30 MG PO CPEP
30.0000 mg | ORAL_CAPSULE | Freq: Every day | ORAL | Status: DC
  Administered 2013-11-06: 30 mg via ORAL
  Filled 2013-11-05 (×2): qty 1

## 2013-11-05 NOTE — Progress Notes (Signed)
The focus of this group is to educate the patient on the purpose and policies of crisis stabilization and provide a format to answer questions about their admission.  The group details unit policies and expectations of patients while admitted.  Patient attended 0900 nurse education orientation group this morning.  Patient listened, appropriate affect, alert, appropriate insight and engagement.  Today patient will work on 3 goals for discharge.  

## 2013-11-05 NOTE — ED Provider Notes (Signed)
Medical screening examination/treatment/procedure(s) were performed by non-physician practitioner and as supervising physician I was immediately available for consultation/collaboration.    Ravis Herne, MD 11/05/13 0958 

## 2013-11-05 NOTE — Progress Notes (Signed)
Pt attended AA group 

## 2013-11-05 NOTE — Progress Notes (Signed)
D:  Patient's self inventory sheet, patient has fair sleep, improving appetite, low energy level, poor attention span.  Rated depression 5, hopeless 4, anxiety 8.  Has experienced tremors, diarrhea, chilling, cravings, agitation in past 24 hours.  Denied SI.  Has felt lightheaded and pain in past 24 hours.  Worst pain 8.  Plans to change people he sees after discharge.   A:  Medications administered per MD orders.  Emotional support and encouragement given patient. R:  Denied SI and HI.  Denied A/V hallucinations.  Denied pain.  Will continue to monitor for safety with 15 minute checks.  Safety maintained.

## 2013-11-05 NOTE — Progress Notes (Signed)
[facility] MD Progress Note  11/05/2013 4:12 PM Bryan Jennings  MRN:  [id number] Subjective:  States he is worried about his daughter's well being. He admits to persistent anxiety, and depression. His Hep C screening was positive. States he did not know he had it, but suspected it as his brother has it and he had shared needles with him. His brother wants him to go to a long term program. He does not want to do this.  Diagnosis:   DSM5: Schizophrenia Disorders:  none Obsessive-Compulsive Disorders:  none Trauma-Stressor Disorders:  Posttraumatic Stress Disorder (309.81) Substance/Addictive Disorders:  Cannabis Use Disorder - Moderate 9304.30) and Opioid Disorder - Moderate (304.00), Cocaine use Disorder Depressive Disorders:  Major Depressive Disorder - Moderate (296.22)  Axis I: Anxiety Disorder NOS  ADL's:  Intact  Sleep: Fair  Appetite:  Fair  Suicidal Ideation:  Plan:  denies Intent:  denies Means:  denies Homicidal Ideation:  Plan:  denies Intent:  denies Means:  denies AEB (as evidenced by):  Psychiatric Specialty Exam: Review of Systems  Constitutional: Negative.   HENT: Negative.   Eyes: Negative.   Respiratory: Negative.   Cardiovascular: Negative.   Gastrointestinal: Negative.   Genitourinary: Negative.   Musculoskeletal: Negative.   Skin: Negative.   Neurological: Negative.   Endo/Heme/Allergies: Negative.   Psychiatric/Behavioral: Positive for depression and substance abuse.    Blood pressure 106/68, pulse 76, temperature 98 F (36.7 C), temperature source Oral, resp. rate 16, height 5' 10.5" (1.791 m), weight 108.41 kg (239 lb).Body mass index is 33.8 kg/(m^2).  General Appearance: Fairly Groomed  Patent attorney::  Minimal  Speech:  Clear and Coherent, Slow and not spontaneous  Volume:  Decreased  Mood:  Anxious, Depressed and worried  Affect:  Restricted  Thought Process:  Coherent and Goal Directed  Orientation:  Full (Time, Place, and Person)  Thought  Content:  symptoms, worries, concerns  Suicidal Thoughts:  No  Homicidal Thoughts:  No  Memory:  Immediate;   Fair Recent;   Fair Remote;   Fair  Judgement:  Fair  Insight:  Present  Psychomotor Activity:  Restlessness  Concentration:  Fair  Recall:  Fair  Akathisia:  NA  Handed:    AIMS (if indicated):     Assets:  Desire for Improvement  Sleep:  Number of Hours: 5.75   Current Medications: Current Facility-Administered Medications  Medication Dose Route Frequency Provider Last Rate Last Dose  . acetaminophen (TYLENOL) tablet 650 mg  650 mg Oral Q6H PRN Kerry Hough, PA-C      . alum & mag hydroxide-simeth (MAALOX/MYLANTA) 200-200-20 MG/5ML suspension 30 mL  30 mL Oral Q4H PRN Kerry Hough, PA-C      . chlordiazePOXIDE (LIBRIUM) capsule 25 mg  25 mg Oral Q6H PRN Sanjuana Kava, NP   25 mg at 11/05/13 1253  . dicyclomine (BENTYL) tablet 20 mg  20 mg Oral Q6H PRN Sanjuana Kava, NP      . DULoxetine (CYMBALTA) DR capsule 20 mg  20 mg Oral Daily Rachael Fee, MD   20 mg at 11/05/13 0904  . gabapentin (NEURONTIN) capsule 300 mg  300 mg Oral TID Rachael Fee, MD   300 mg at 11/05/13 1146  . hydrOXYzine (ATARAX/VISTARIL) tablet 25 mg  25 mg Oral Q6H PRN Sanjuana Kava, NP   25 mg at 11/05/13 1149  . influenza vac split quadrivalent PF (FLUARIX) injection 0.5 mL  0.5 mL Intramuscular Tomorrow-1000 Rachael Fee, MD      .  loperamide (IMODIUM) capsule 2-4 mg  2-4 mg Oral PRN Sanjuana Kava, NP      . loperamide (IMODIUM) capsule 2-4 mg  2-4 mg Oral PRN Sanjuana Kava, NP      . magnesium hydroxide (MILK OF MAGNESIA) suspension 30 mL  30 mL Oral Daily PRN Kerry Hough, PA-C      . methocarbamol (ROBAXIN) tablet 500 mg  500 mg Oral Q8H PRN Sanjuana Kava, NP   500 mg at 11/04/13 2147  . multivitamin with minerals tablet 1 tablet  1 tablet Oral Daily Sanjuana Kava, NP   1 tablet at 11/05/13 0347  . naproxen (NAPROSYN) tablet 500 mg  500 mg Oral BID PRN Sanjuana Kava, NP      . nicotine  (NICODERM CQ - dosed in mg/24 hours) patch 21 mg  21 mg Transdermal Q0600 Kerry Hough, PA-C   21 mg at 11/05/13 0906  . ondansetron (ZOFRAN-ODT) disintegrating tablet 4 mg  4 mg Oral Q6H PRN Sanjuana Kava, NP      . ondansetron (ZOFRAN-ODT) disintegrating tablet 4 mg  4 mg Oral Q6H PRN Sanjuana Kava, NP      . thiamine (VITAMIN B-1) tablet 100 mg  100 mg Oral Daily Sanjuana Kava, NP   100 mg at 11/05/13 4259  . zolpidem (AMBIEN) tablet 10 mg  10 mg Oral QHS PRN Rachael Fee, MD   10 mg at 11/04/13 2148    Lab Results:  Results for orders placed during the hospital encounter of 11/04/13 (from the past 48 hour(s))  HEPATITIS PANEL, ACUTE     Status: Abnormal   Collection Time    11/04/13  7:33 PM      Result Value Range   Hepatitis B Surface Ag NEGATIVE  NEGATIVE   HCV Ab Reactive (*) NEGATIVE   Comment: (NOTE)                                                                               This test is for screening purposes only.  Reactive results should be     confirmed by an alternative method.  Suggest HCV Qualitative, PCR,     test code 56387.  Specimens will be stable for reflex testing up to 3     days after collection.   Hep A IgM NON REACTIVE  NON REACTIVE   Hep B C IgM NON REACTIVE  NON REACTIVE   Comment: (NOTE)     High levels of Hepatitis B Core IgM antibody are detectable     during the acute stage of Hepatitis B. This antibody is used     to differentiate current from past HBV infection.     Performed at [facility]    Physical Findings: AIMS: Facial and Oral Movements Muscles of Facial Expression: None, normal Lips and Perioral Area: None, normal Jaw: None, normal Tongue: None, normal,Extremity Movements Upper (arms, wrists, hands, fingers): None, normal Lower (legs, knees, ankles, toes): None, normal, Trunk Movements Neck, shoulders, hips: None, normal, Overall Severity Severity of abnormal movements (highest score from questions above): None,  normal Incapacitation due to abnormal movements: None, normal Patient's awareness of  abnormal movements (rate only patient's report): No Awareness, Dental Status Current problems with teeth and/or dentures?: No Does patient usually wear dentures?: No  CIWA:  CIWA-Ar Total: 2 COWS:  COWS Total Score: 3  Treatment Plan Summary: Daily contact with patient to assess and evaluate symptoms and progress in treatment Medication management  Plan: Supportive approach/coping skills/relapse prevention           CBT;mindfulness           Increase the Neurontin to 300 mg TID           Increase the Cymbalta 30 mg dialy  Medical Decision Making Problem Points:  New problem, with additional work-up planned (4) and Review of psycho-social stressors (1) Data Points:  Review of medication regiment & side effects (2) Review of new medications or change in dosage (2)  I certify that inpatient services furnished can reasonably be expected to improve the patient's condition.   Fenris Cauble A 11/05/2013, 4:12 PM

## 2013-11-05 NOTE — Progress Notes (Signed)
Adult Psychoeducational Group Note  Date:  11/05/2013 Time:  9:15AM Group Topic/Focus:  Nursing Group  Participation Level:  Active  Participation Quality:  Appropriate and Attentive  Affect:  Appropriate  Cognitive:  Alert and Appropriate  Insight: Appropriate  Engagement in Group:  Engaged  Modes of Intervention:  Discussion  Additional Comments:  Pt was attentive anda appropriate during today's morning Wellness group with nursing staff.    Bing PlumeScott, Niklas Chretien D 11/05/2013, 1:11 PM

## 2013-11-05 NOTE — Progress Notes (Addendum)
Patient ID: Bryan Jennings, male   DOB: 20-Jan-1991, 23 y.o.   MRN: [id number] D: Pt is anxious in affect and depressed in mood. Pt is reporting anxiety, restlessness, and insomnia. Pt's meds were effective in managing his concerns. Pt was present for group this evening.Pt observed interacting appropriately within the milieu.  Pt is denying any suicidal ideation.  A: Writer administered scheduled and prn medications to pt. Continued support and availability as needed was extended to this pt. Staff continue to monitor pt with q4915min checks.  R: No adverse drug reactions noted. Pt receptive to treatment. Pt remains safe at this time.

## 2013-11-06 MED ORDER — HYDROXYZINE HCL 25 MG PO TABS
25.0000 mg | ORAL_TABLET | Freq: Three times a day (TID) | ORAL | Status: DC
  Filled 2013-11-06 (×3): qty 42

## 2013-11-06 MED ORDER — ZOLPIDEM TARTRATE 10 MG PO TABS: 10.0000 mg | ORAL_TABLET | Freq: Every evening | ORAL | Status: DC | PRN

## 2013-11-06 MED ORDER — NAPROXEN 500 MG PO TABS: ORAL_TABLET | ORAL | Status: DC

## 2013-11-06 MED ORDER — GABAPENTIN 300 MG PO CAPS: 300.0000 mg | ORAL_CAPSULE | Freq: Three times a day (TID) | ORAL | Status: DC

## 2013-11-06 MED ORDER — DULOXETINE HCL 30 MG PO CPEP: 30.0000 mg | ORAL_CAPSULE | Freq: Every day | ORAL | Status: DC

## 2013-11-06 MED ORDER — HYDROXYZINE HCL 25 MG PO TABS: ORAL_TABLET | ORAL | Status: DC

## 2013-11-06 NOTE — Tx Team (Signed)
Interdisciplinary Treatment Plan Update (Adult)  Date: 11/06/2013  Time Reviewed:  9:45 AM  Progress in Treatment: Attending groups: Yes Participating in groups:  Yes Taking medication as prescribed:  Yes Tolerating medication:  Yes Family/Significant othe contact made: Yes Patient understands diagnosis:  Yes Discussing patient identified problems/goals with staff:  Yes Medical problems stabilized or resolved:  Yes Denies suicidal/homicidal ideation: Yes Issues/concerns per patient self-inventory:  Yes Other:  New problem(s) identified: N/A  Discharge Plan or Barriers: [facility] and therapy.    Reason for Continuation of Hospitalization: Stable to d/c today  Comments: N/A  Estimated length of stay: D/C today  For review of initial/current patient goals, please see plan of care.  Attendees: Patient:     Family:     Physician:  Dr. Dub MikesLugo 11/06/2013 10:17 AM   Nursing:   Manuela SchwartzJennifer Pritchett, RN 11/06/2013 10:17 AM   Clinical Social Worker:  Reyes Ivanhelsea Horton, LCSW 11/06/2013 10:17 AM   Other: Dellia CloudAndrea Thorne, RN 11/06/2013 10:17 AM   Other: Mordecai RasmussenHannah Coble, LCSW 11/06/2013 10:17 AM   Other:  Serena ColonelAggie Nwoko, NP 11/06/2013 10:17 AM   Other:     Other:    Other:    Other:    Other:    Other:    Other:     Scribe for Treatment Team:   Carmina MillerHorton, Malayasia Mirkin Nicole, 11/06/2013 , 10:17 AM
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
## 2013-11-06 NOTE — Progress Notes (Signed)
D/C instructions/meds/follow-up appointments reviewed, pt verbalized understanding, pt's belongings returned to pt, samples given. 

## 2013-11-06 NOTE — Progress Notes (Signed)
D    Pt attended groups this evening   He appears sad and depressed   He was somewhat irritable when asking for his medications   He reports difficulty sleeping and increased anxiety A   Verbal support given   Medications administered and effectivness monitored   Offered suggestions for better sleep   Monitor Q 15 min R    Pt is safe at present

## 2013-11-06 NOTE — [facility] Group Notes (Signed)
[facility] Note   11/06/2013 8:45 AM  Participation Quality:  Alert, Appropriate and Oriented  Mood/Affect:  Calm  Depression Rating:  1  Anxiety Rating:  7  Thoughts of Suicide:  Pt denies SI/HI  Will you contract for safety?   Yes  Current AVH:  Pt denies  Plan for Discharge/Comments:  Pt attended discharge planning group and actively participated in group.  CSW provided pt with today's workbook.  Pt reports feeling stable to d/c today.  Pt [place] stay with a cousin in [place] until he is able to get back into an [facility].  [facility]  No further needs voiced by pt at this time.    Transportation Means: Pt reports access to transportation - provided pt with a bus pass and PART money  Supports: No supports mentioned at this time  Reyes IvanChelsea Horton, LCSW 11/06/2013 9:32 AM

## 2013-11-06 NOTE — Progress Notes (Addendum)
[facility] :  Will you be returning to the same living situation after discharge: Yes,  staying with a cousin until he can get back into an [facility] At discharge, do you have transportation home?:Yes,  provided pt with a bus pass and PART money Do you have the ability to pay for your medications:Yes,  access to meds  Release of information consent forms completed and in the chart;  Patient's signature needed at discharge.  Patient to Follow up at: Follow-up Information   Follow up with [facility] On 11/09/2013. (Walk in between 8am-9am Monday through Friday for hospital followup/medication management/assessment for therapy services. )    Contact information:   [address] [place], [place] [address] Phone: [phone] Fax: [phone]      Patient denies SI/HI:   Yes,  denies SI/HI    Safety Planning and Suicide Prevention discussed:  Yes,  discussed with pt and pt's brother.  See suicide prevention education note  Horton, Salome ArntChelsea Nicole 11/06/2013, 10:19 AM

## 2013-11-10 ENCOUNTER — Emergency Department (HOSPITAL_COMMUNITY)
Admission: EM | Admit: 2013-11-10 | Discharge: 2013-11-11 | Disposition: A | Discharge status: Rehabilitation Facility | Payer: Self-pay | Attending: Emergency Medicine | Admitting: Emergency Medicine

## 2013-11-10 ENCOUNTER — Encounter (HOSPITAL_COMMUNITY): Payer: Self-pay | Admitting: Emergency Medicine

## 2013-11-10 DIAGNOSIS — Z79899 Other long term (current) drug therapy: Secondary | ICD-10-CM | POA: Insufficient documentation

## 2013-11-10 DIAGNOSIS — F17200 Nicotine dependence, unspecified, uncomplicated: Secondary | ICD-10-CM | POA: Insufficient documentation

## 2013-11-10 DIAGNOSIS — R1110 Vomiting, unspecified: Secondary | ICD-10-CM | POA: Insufficient documentation

## 2013-11-10 DIAGNOSIS — R45851 Suicidal ideations: Secondary | ICD-10-CM | POA: Insufficient documentation

## 2013-11-10 DIAGNOSIS — F1910 Other psychoactive substance abuse, uncomplicated: Secondary | ICD-10-CM | POA: Insufficient documentation

## 2013-11-10 LAB — COMPREHENSIVE METABOLIC PANEL
ALT: 141 U/L — ABNORMAL HIGH (ref 0–53)
AST: 81 U/L — ABNORMAL HIGH (ref 0–37)
Albumin: 4.2 g/dL (ref 3.5–5.2)
Alkaline Phosphatase: 57 U/L (ref 39–117)
BILIRUBIN TOTAL: 0.9 mg/dL (ref 0.3–1.2)
BUN: 13 mg/dL (ref 6–23)
CO2: 21 meq/L (ref 19–32)
CREATININE: 0.91 mg/dL (ref 0.50–1.35)
Calcium: 9.2 mg/dL (ref 8.4–10.5)
Chloride: 105 mEq/L (ref 96–112)
GLUCOSE: 79 mg/dL (ref 70–99)
Potassium: 4.8 mEq/L (ref 3.7–5.3)
Sodium: 142 mEq/L (ref 137–147)
Total Protein: 7.7 g/dL (ref 6.0–8.3)

## 2013-11-10 LAB — CBC
HEMATOCRIT: 44 % (ref 39.0–52.0)
HEMOGLOBIN: 15.4 g/dL (ref 13.0–17.0)
MCH: 32.2 pg (ref 26.0–34.0)
MCHC: 35 g/dL (ref 30.0–36.0)
MCV: 91.9 fL (ref 78.0–100.0)
Platelets: 289 10*3/uL (ref 150–400)
RBC: 4.79 MIL/uL (ref 4.22–5.81)
RDW: 12.6 % (ref 11.5–15.5)
WBC: 7.8 10*3/uL (ref 4.0–10.5)

## 2013-11-10 LAB — RAPID URINE DRUG SCREEN, HOSP PERFORMED
Amphetamines: NOT DETECTED
BARBITURATES: NOT DETECTED
Benzodiazepines: POSITIVE — AB
Cocaine: POSITIVE — AB
Opiates: POSITIVE — AB
Tetrahydrocannabinol: NOT DETECTED

## 2013-11-10 LAB — SALICYLATE LEVEL

## 2013-11-10 LAB — ACETAMINOPHEN LEVEL: Acetaminophen (Tylenol), Serum: <15 ug/mL (ref 10–30)

## 2013-11-10 LAB — ETHANOL

## 2013-11-10 MED ORDER — DULOXETINE HCL 30 MG PO CPEP
30.0000 mg | ORAL_CAPSULE | Freq: Every day | ORAL | Status: DC
  Administered 2013-11-11: 30 mg via ORAL
  Filled 2013-11-10: qty 1

## 2013-11-10 MED ORDER — ACETAMINOPHEN 325 MG PO TABS: 650.0000 mg | ORAL_TABLET | ORAL | Status: DC | PRN

## 2013-11-10 MED ORDER — LORAZEPAM 1 MG PO TABS
1.0000 mg | ORAL_TABLET | Freq: Three times a day (TID) | ORAL | Status: DC | PRN
  Administered 2013-11-11: 1 mg via ORAL
  Filled 2013-11-10: qty 1

## 2013-11-10 MED ORDER — IBUPROFEN 200 MG PO TABS
600.0000 mg | ORAL_TABLET | Freq: Three times a day (TID) | ORAL | Status: DC | PRN
  Administered 2013-11-11: 600 mg via ORAL
  Filled 2013-11-10: qty 3

## 2013-11-10 MED ORDER — NICOTINE 21 MG/24HR TD PT24
21.0000 mg | MEDICATED_PATCH | Freq: Every day | TRANSDERMAL | Status: DC
  Administered 2013-11-10 – 2013-11-11 (×2): 21 mg via TRANSDERMAL
  Filled 2013-11-10 (×2): qty 1

## 2013-11-10 MED ORDER — ZOLPIDEM TARTRATE 5 MG PO TABS
5.0000 mg | ORAL_TABLET | Freq: Every evening | ORAL | Status: DC | PRN
  Administered 2013-11-11: 5 mg via ORAL
  Filled 2013-11-10: qty 1

## 2013-11-10 MED ORDER — HYDROXYZINE HCL 25 MG PO TABS
25.0000 mg | ORAL_TABLET | Freq: Three times a day (TID) | ORAL | Status: DC | PRN
  Administered 2013-11-11 (×2): 25 mg via ORAL
  Filled 2013-11-10 (×2): qty 1

## 2013-11-10 MED ORDER — GABAPENTIN 300 MG PO CAPS
300.0000 mg | ORAL_CAPSULE | Freq: Three times a day (TID) | ORAL | Status: DC
  Administered 2013-11-11 (×2): 300 mg via ORAL
  Filled 2013-11-10 (×2): qty 1

## 2013-11-10 MED ORDER — ONDANSETRON HCL 4 MG PO TABS
4.0000 mg | ORAL_TABLET | Freq: Three times a day (TID) | ORAL | Status: DC | PRN
  Filled 2013-11-10: qty 1

## 2013-11-10 MED ORDER — ALUM & MAG HYDROXIDE-SIMETH 200-200-20 MG/5ML PO SUSP: 30.0000 mL | ORAL | Status: DC | PRN

## 2013-11-10 NOTE — ED Notes (Signed)
Pt. Given [place] Sandwhich and applesauce.

## 2013-11-10 NOTE — ED Provider Notes (Signed)
CSN: [id number]     Arrival date & time 11/10/13  1521 History   First MD Initiated Contact with Patient 11/10/13 1826     Chief Complaint  Patient presents with  . Medical Clearance   (Consider location/radiation/quality/duration/timing/severity/associated sxs/prior Treatment) HPI Comments: Patient is a 23 year old male past medical history significant for polysubstance abuse presented to the emergency department requesting detox from heroin, cocaine, alcohol. He states his last use of heroin was this morning around 2 AM on his last use of cocaine and alcohol were yesterday evening. Patient states he was sent to behavioral health last week for detox but did not stay. He states this week he "feels more ready to quit." The patient endorses some fleeting suicidal ideations stating he would overdose to take his own life. He has no plan to act on his SI anytime in the near future. He denies any homicidal ideations or hallucinations. He does endorse some jitteriness and 1-2 episodes of nonbloody emesis this morning. He denies any chest pain, shortness of breath, abdominal pain.   Past Medical History  Diagnosis Date  . Substance abuse    Past Surgical History  Procedure Laterality Date  . Appendectomy     History reviewed. No pertinent family history. History  Substance Use Topics  . Smoking status: Current Every Day Smoker -- 1.00 packs/day for 12 years    Types: Cigarettes  . Smokeless tobacco: Not on file  . Alcohol Use: Yes     Comment: etoh abuse    Review of Systems  Constitutional: Negative for fever and chills.  Gastrointestinal: Positive for vomiting.  Psychiatric/Behavioral: Positive for suicidal ideas. Negative for sleep disturbance and self-injury.  All other systems reviewed and are negative.    Allergies  Review of patient's allergies indicates no known allergies.  Home Medications   Current Outpatient Rx  Name  Route  Sig  Dispense  Refill  . DULoxetine (CYMBALTA)  30 MG capsule   Oral   Take 1 capsule (30 mg total) by mouth daily. For depression   30 capsule   0   . gabapentin (NEURONTIN) 300 MG capsule   Oral   Take 1 capsule (300 mg total) by mouth 3 (three) times daily. For withdrawal related anxiety symptoms   90 capsule   0   . hydrOXYzine (ATARAX/VISTARIL) 25 MG tablet      Take 1 tablet (25 mg) three times daily for anxiety   90 tablet   0   . zolpidem (AMBIEN) 10 MG tablet   Oral   Take 1 tablet (10 mg total) by mouth at bedtime as needed for sleep.   5 tablet   0    BP 134/67  Pulse 88  Temp(Src) 97.7 F (36.5 C) (Oral)  Resp 18  SpO2 95% Physical Exam  Constitutional: He is oriented to person, place, and time. He appears well-developed and well-nourished. No distress.  HENT:  Head: Normocephalic and atraumatic.  Right Ear: External ear normal.  Left Ear: External ear normal.  Nose: Nose normal.  Mouth/Throat: Oropharynx is clear and moist.  Eyes: Conjunctivae are normal.  Neck: Normal range of motion. Neck supple.  Cardiovascular: Normal rate, regular rhythm and normal heart sounds.   Pulmonary/Chest: Effort normal and breath sounds normal. No respiratory distress.  Abdominal: Soft.  Musculoskeletal: Normal range of motion.  Neurological: He is alert and oriented to person, place, and time.  Skin: Skin is warm and dry. He is not diaphoretic. No erythema.  No evidence  of cellulitis at left antecubital area at injection site.   Psychiatric: He has a normal mood and affect. He is not actively hallucinating. He expresses suicidal ideation. He expresses suicidal plans. He expresses no homicidal plans.    ED Course  Procedures (including critical care time) Medications  acetaminophen (TYLENOL) tablet 650 mg (not administered)  ibuprofen (ADVIL,MOTRIN) tablet 600 mg (not administered)  zolpidem (AMBIEN) tablet 5 mg (not administered)  nicotine (NICODERM CQ - dosed in mg/24 hours) patch 21 mg (21 mg Transdermal Patch  Applied 11/10/13 2059)  ondansetron (ZOFRAN) tablet 4 mg (not administered)  alum & mag hydroxide-simeth (MAALOX/MYLANTA) 200-200-20 MG/5ML suspension 30 mL (not administered)  LORazepam (ATIVAN) tablet 1 mg (not administered)  DULoxetine (CYMBALTA) DR capsule 30 mg (not administered)  gabapentin (NEURONTIN) capsule 300 mg (not administered)  hydrOXYzine (ATARAX/VISTARIL) tablet 25 mg (not administered)    Labs Review Labs Reviewed  COMPREHENSIVE METABOLIC PANEL - Abnormal; Notable for the following:    AST 81 (*)    ALT 141 (*)    All other components within normal limits  SALICYLATE LEVEL - Abnormal; Notable for the following:    Salicylate Lvl <2.0 (*)    All other components within normal limits  URINE RAPID DRUG SCREEN (HOSP PERFORMED) - Abnormal; Notable for the following:    Opiates POSITIVE (*)    Cocaine POSITIVE (*)    Benzodiazepines POSITIVE (*)    All other components within normal limits  ACETAMINOPHEN LEVEL  CBC  ETHANOL   Imaging Review No results found.  EKG Interpretation   None       MDM   1. Polysubstance abuse     Afebrile, NAD, non-toxic appearing, AAOx4. Patient requesting detox from opiates, cocaine, benzodiazepines, alcohol. Patient with long-standing history of polysubstance abuse. Patient with fleeting ideas of suicidal ideation with plan to overdose but no intention to act on that plan. Patient without signs of opiate withdrawal at this time. No respiratory depression or distress. I have personally reviewed all laboratory results. Psych old orders have been placed. Home medications have been ordered. Patient has been moved to Pod C awaiting TTS and behavioral health assessment. Patient awaiting TTS evaluation in [facility].      Keaira Whitehurst L Ayahna Solazzo, PA-C 11/10/13 2244

## 2013-11-10 NOTE — BH Assessment (Signed)
Tele Assessment Note   Bryan Jennings is an 23 y.o. male who presents seeking detox.  He reports trying to get clean several times recently.  He was in [place] at teh end of 2014, then used again and was admitted to [facility] for detox on the 2nd of January.  He states he wanted to be discharged and knew he was going to use again, which he did.  He reports using about a gram of heroin daily since his discharge and also reports using 10 mg of xanax daily, some cocaine, and abusing alcohol a few times.  He states that yesterday his girlfriend "got locked up in [place]" and that really upset him and he began feeling suicidal with a plan to overdose, but decided to come to the hospital for help.  He reports that he is tired of being dependent on drugs and that the problem the first few times is that he wasn't ready to surrender his life, but now he is.  He states his brother is a support and has 1 year of sobriety.  He denies HI or AVH and reports that he has no open wounds.  He does have some new criminal charges for possession, but no court date until March, he has no open wounds and is not a Cytogeneticist.  He has no history of assault or violence toward others.  He is calm and cooperative and motivated for treatment.  Axis I: Depressive Disorder NOS and Substance Abuse Opioid Dependence, Benzodiazepines, Alcohol Abuse Axis II: Deferred Axis III:  Past Medical History  Diagnosis Date  . Substance abuse    Axis IV: housing problems, problems with access to health care services and problems with primary support group Axis V: 41-50 serious symptoms  Past Medical History:  Past Medical History  Diagnosis Date  . Substance abuse     Past Surgical History  Procedure Laterality Date  . Appendectomy      Family History: History reviewed. No pertinent family history.  Social History:  reports that he has been smoking Cigarettes.  He has a 12 pack-year smoking history. He does not have any smokeless tobacco  history on file. He reports that he drinks alcohol. He reports that he uses illicit drugs (Cocaine, Heroin, and Benzodiazepines).  Additional Social History:  Alcohol / Drug Use History of alcohol / drug use?: Yes Substance #1 Name of Substance 1: Heroin  1 - Age of First Use: 17 YOM  1 - Amount (size/oz): 1 gram daily 1 - Frequency: daily  1 - Duration: since 1/2 1 - Last Use / Amount: 11/09/12 0200 Substance #2 Name of Substance 2: Cocaine  2 - Age of First Use: 17 YOM  2 - Amount (size/oz): half gram 2 - Frequency: daily 2 - Duration: since 11/06/13 2 - Last Use / Amount: 11/09/13 noon Substance #3 Name of Substance 3: Alcohol  3 - Age of First Use: Teens  3 - Amount (size/oz): 12 Beers  3 - Frequency: a few times since [facility] discharge 3 - Duration: since 11/06/13 3 - Last Use / Amount: 11/08/13 Substance #4 Name of Substance 4: Benzos(Xanax)  4 - Age of First Use: 23 YO 4 - Amount (size/oz): 10 mg 4 - Frequency: daily 4 - Duration: since 11/06/13 4 - Last Use / Amount: 11/09/13, took vistaril on 11/10/13  CIWA: CIWA-Ar BP: 134/67 mmHg Pulse Rate: 88 COWS: Clinical Opiate Withdrawal Scale (COWS) Resting Pulse Rate: Pulse Rate 81-100 Sweating: No report of chills or flushing  Restlessness: Able to sit still Pupil Size: Pupils pinned or normal size for room light Bone or Joint Aches: Not present Runny Nose or Tearing: Not present GI Upset: No GI symptoms Tremor: No tremor Yawning: No yawning Anxiety or Irritability: None Gooseflesh Skin: Skin is smooth COWS Total Score: 1  Allergies: No Known Allergies  Home Medications:  (Not in a hospital admission)  OB/GYN Status:  No LMP for male patient.  General Assessment Data [facility] Is this a Tele or Face-to-Face Assessment?: Tele Assessment Is this an Initial Assessment or a Re-assessment for this encounter?: Initial Assessment Living Arrangements: Other relatives (cousin) Can pt return to current living  arrangement?: Yes Admission Status: Voluntary Is patient capable of signing voluntary admission?: Yes Transfer from: Acute Hospital Referral Source: Self/Family/Friend     [facility] Living Arrangements: Other relatives (cousin)  Education Status Is patient currently in school?: No Highest grade of school patient has completed: 8th grade-I got kicked out of high school for truancy.  Name of school: n/a  Contact person: None   Risk to self Suicidal Ideation: Yes-Currently Present (thought about overdosing earlier) Suicidal Intent: No-Not Currently/Within Last 6 Months Is patient at risk for suicide?: Yes Suicidal Plan?: No-Not Currently/Within Last 6 Months (to overdose) Specify Current Suicidal Plan: overdose Access to Means: Yes Specify Access to Suicidal Means: drugs What has been your use of drugs/alcohol within the last 12 months?: ongoing Previous Attempts/Gestures: No Triggers for Past Attempts: None known Intentional Self Injurious Behavior: None Family Suicide History: Yes (cousin overdosed on methadone) Recent stressful life event(s): Recent negative physical changes;Other (Comment) (relapse, [place]) Persecutory voices/beliefs?: No Depression: Yes Depression Symptoms: Feeling worthless/self pity;Feeling angry/irritable;Loss of interest in usual pleasures;Guilt;Fatigue;Isolating;Insomnia Substance abuse history and/or treatment for substance abuse?: Yes Suicide prevention information given to non-admitted patients: Not applicable  Risk to Others Homicidal Ideation: No Thoughts of Harm to Others: No Current Homicidal Intent: No Current Homicidal Plan: No Access to Homicidal Means: No History of harm to others?: No Assessment of Violence: None Noted Does patient have access to weapons?: No Criminal Charges Pending?: Yes Describe Pending Criminal Charges: posession of marijuana up to 1/2 ounce, posession of pill bottle in another name Does  patient have a court date: Yes Court Date: 01/19/14  Psychosis Hallucinations: None noted Delusions: None noted  Mental Status Report Appear/Hygiene: Other (Comment) (unremarkable) Eye Contact: Good Motor Activity: Freedom of movement Speech: Logical/coherent Level of Consciousness: Alert Mood: Depressed Affect: Appropriate to circumstance Anxiety Level: Panic Attacks Panic attack frequency: 5 times per week Most recent panic attack: 11/08/13 Thought Processes: Coherent;Relevant Judgement: Impaired Orientation: Person;Place;Time;Situation Obsessive Compulsive Thoughts/Behaviors: Minimal  Cognitive Functioning Concentration: Decreased Memory: Recent Intact;Remote Intact IQ: Average Insight: Fair Impulse Control: Poor Appetite: Fair Weight Loss: 0 Weight Gain: 0 Sleep: Decreased Total Hours of Sleep: 4 Vegetative Symptoms: Staying in bed;Decreased grooming  ADLScreening [facility] Assessment Services) Patient's cognitive ability adequate to safely complete daily activities?: Yes Patient able to express need for assistance with ADLs?: Yes Independently performs ADLs?: Yes (appropriate for developmental age)  Prior Inpatient Therapy Prior Inpatient Therapy: Yes Prior Therapy Dates: 2014 (2014, 2015) [facility] Reason for Treatment: Rehab/detox   Prior Outpatient Therapy Prior Outpatient Therapy: No Prior Therapy Dates: None  Prior Therapy Facilty/Provider(s): None  Reason for Treatment: None   ADL Screening (condition at time of admission) Patient's cognitive ability adequate to safely complete daily activities?: Yes Patient able to express need for  assistance with ADLs?: Yes Independently performs ADLs?: Yes (appropriate for developmental age)  Home Assistive Devices/Equipment Home Assistive Devices/Equipment: None    Abuse/Neglect Assessment (Assessment to be complete while patient is alone) Physical Abuse: Denies Verbal  Abuse: Denies Sexual Abuse: Denies Exploitation of patient/patient's resources: Denies Values / Beliefs Cultural Requests During Hospitalization: None Spiritual Requests During Hospitalization: None     Nutrition Screen- [facility] Adult/WL/AP Patient's home diet: Regular  Additional Information 1:1 In Past 12 Months?: No CIRT Risk: No Elopement Risk: No Does patient have medical clearance?: Yes     Disposition:  Disposition Initial Assessment Completed for this Encounter: Yes Disposition of Patient: Inpatient treatment program;Referred to Type of inpatient treatment program: Adult Patient referred to: Other (Comment)  Davee Marlana Latus 11/10/2013 11:11 PM

## 2013-11-10 NOTE — ED Notes (Addendum)
Pt requesting detox from heroin, last used last night. Reports feeling anxious and jittery, n/v this am. Calm and no distress noted at this time, airway intact. Denies any SI or HI.

## 2013-11-10 NOTE — ED Notes (Signed)
Pt alert, appropriate.  Reports last use of heroin, cocaine and xanax was yesterday.  Explained the process to him regarding Pod C.  Changing into paper scrubs now.  Pod C nurse aware.

## 2013-11-10 NOTE — ED Notes (Signed)
Report to pod c rn.

## 2013-11-11 MED ORDER — LOPERAMIDE HCL 2 MG PO CAPS: 2.0000 mg | ORAL_CAPSULE | ORAL | Status: DC | PRN

## 2013-11-11 MED ORDER — DICYCLOMINE HCL 20 MG PO TABS: 20.0000 mg | ORAL_TABLET | Freq: Four times a day (QID) | ORAL | Status: DC | PRN

## 2013-11-11 MED ORDER — CLONIDINE HCL 0.1 MG PO TABS: 0.1000 mg | ORAL_TABLET | ORAL | Status: DC

## 2013-11-11 MED ORDER — NAPROXEN 250 MG PO TABS: 500.0000 mg | ORAL_TABLET | Freq: Two times a day (BID) | ORAL | Status: DC | PRN

## 2013-11-11 MED ORDER — METHOCARBAMOL 500 MG PO TABS
500.0000 mg | ORAL_TABLET | Freq: Three times a day (TID) | ORAL | Status: DC | PRN
  Administered 2013-11-11: 500 mg via ORAL
  Filled 2013-11-11: qty 1

## 2013-11-11 MED ORDER — LOPERAMIDE HCL 2 MG PO CAPS
4.0000 mg | ORAL_CAPSULE | Freq: Once | ORAL | Status: AC
  Administered 2013-11-11: 4 mg via ORAL
  Filled 2013-11-11: qty 2

## 2013-11-11 MED ORDER — HYDROXYZINE HCL 25 MG PO TABS: 25.0000 mg | ORAL_TABLET | Freq: Four times a day (QID) | ORAL | Status: DC | PRN

## 2013-11-11 MED ORDER — ONDANSETRON 4 MG PO TBDP: 4.0000 mg | ORAL_TABLET | Freq: Four times a day (QID) | ORAL | Status: DC | PRN

## 2013-11-11 MED ORDER — CLONIDINE HCL 0.1 MG PO TABS: 0.1000 mg | ORAL_TABLET | Freq: Every day | ORAL | Status: DC

## 2013-11-11 MED ORDER — CLONIDINE HCL 0.1 MG PO TABS
0.1000 mg | ORAL_TABLET | Freq: Four times a day (QID) | ORAL | Status: DC
  Administered 2013-11-11: 0.1 mg via ORAL
  Filled 2013-11-11: qty 1

## 2013-11-11 NOTE — ED Provider Notes (Signed)
Medical screening examination/treatment/procedure(s) were performed by non-physician practitioner and as supervising physician I was immediately available for consultation/collaboration.  EKG Interpretation   None         Junius ArgyleForrest S Victory Strollo, MD 11/11/13 1453

## 2013-11-11 NOTE — ED Notes (Signed)
[facility] CALLED TO TRANSPORT PT TO [facility] 

## 2013-11-11 NOTE — BH Assessment (Signed)
11/11/2013  [facility] at [facility] and Bryan Jennings was placed on the waiting list. [facility] and MD will review all paper work upon their arrival this morning.

## 2013-11-11 NOTE — Progress Notes (Signed)
Bryan Jennings, MHT provided follow with recommended disposition for patient who is in need of detox. Writer spoke with patient to inquire of his noted SI in assessment. Patient denies SI at this time and is willing to contract for safety which would make him appropriate for detox program at [facility]. Writer submitted referral to [facility] which was reviewed by Bryan Jennings. Writer included patients signed contract for safety along with [facility] prescreening. Bryan Jennings from [facility] reports back acceptance for detox. Patient accepted by Bryan Jennings of [facility] for treatment. Writer informed attending RN, Bryan Jennings who will arrange for patient to be transferred by [facility] and will contact [facility] when patient is on the way. Bryan ButtsAnnette, RN reports that patients B/P is currently elevated and will work to stabilize prior to transfer.

## 2013-11-11 NOTE — ED Notes (Signed)
[facility] HAS ARRIVED TO TRANSPORT PT 

## 2013-11-11 NOTE — ED Notes (Signed)
Discuss with Berna SpareMarcus to have patient ekg  To be fax to [facility].

## 2013-11-11 NOTE — ED Notes (Signed)
Fax ekg to [facility]

## 2013-11-11 NOTE — BH Assessment (Signed)
11/11/13  Checked on [facility] with [facility], [facility], [facility], [facility], [facility], and [facility] Spoke with Claris CheMargaret at [facility] and no beds was available at the present time but requested for labs and [facility] to [facility]. [facility], [facility], [facility], and [facility]) all expressed they don't take detox referrals although [employer] was also SI. Spoke with Aggie Cosierheresa at [facility] and no adult beds was available on 11/10/2013, but adult beds may come available after discharges on 11/11/2013 requested for labs and [facility] to [facility]. [facility] Claud[facility], beds was available and Claudine requested UDS, CBC, EKG be faxed over along [facility]. All paper work has been faxed over and awaiting a decision for [facility], [facility].

## 2013-11-11 NOTE — ED Notes (Signed)
PT BELONGINGS RETURNED.

## 2013-11-11 NOTE — Progress Notes (Signed)
Patient Discharge Instructions:  After Visit Summary (AVS):   Faxed to:  11/11/13 Discharge Summary Note:   Faxed to:  11/11/13 Psychiatric Admission Assessment Note:   Faxed to:  11/11/13 Suicide Risk Assessment - Discharge Assessment:   Faxed to:  11/11/13 Faxed/Sent to the Next Level Care provider:  11/11/13 [facility] @ [phone]  Jerelene ReddenSheena E [place], 11/11/2013, 4:12 PM

## 2016-08-21 ENCOUNTER — Emergency Department (HOSPITAL_COMMUNITY): Payer: Self-pay

## 2016-08-21 ENCOUNTER — Inpatient Hospital Stay (HOSPITAL_COMMUNITY)
Admission: EM | Admit: 2016-08-21 | Discharge: 2016-09-07 | DRG: 853 | Disposition: A | Discharge status: Home / Self Care | Payer: Self-pay | Attending: Cardiothoracic Surgery | Admitting: Cardiothoracic Surgery

## 2016-08-21 ENCOUNTER — Encounter (HOSPITAL_COMMUNITY): Payer: Self-pay

## 2016-08-21 DIAGNOSIS — F1120 Opioid dependence, uncomplicated: Secondary | ICD-10-CM | POA: Diagnosis present

## 2016-08-21 DIAGNOSIS — F17210 Nicotine dependence, cigarettes, uncomplicated: Secondary | ICD-10-CM | POA: Diagnosis present

## 2016-08-21 DIAGNOSIS — F4310 Post-traumatic stress disorder, unspecified: Secondary | ICD-10-CM | POA: Diagnosis present

## 2016-08-21 DIAGNOSIS — Z6837 Body mass index (BMI) 37.0-37.9, adult: Secondary | ICD-10-CM

## 2016-08-21 DIAGNOSIS — J9811 Atelectasis: Secondary | ICD-10-CM | POA: Diagnosis present

## 2016-08-21 DIAGNOSIS — A419 Sepsis, unspecified organism: Principal | ICD-10-CM | POA: Diagnosis present

## 2016-08-21 DIAGNOSIS — S2239XA Fracture of one rib, unspecified side, initial encounter for closed fracture: Secondary | ICD-10-CM | POA: Diagnosis present

## 2016-08-21 DIAGNOSIS — F329 Major depressive disorder, single episode, unspecified: Secondary | ICD-10-CM | POA: Diagnosis present

## 2016-08-21 DIAGNOSIS — J90 Pleural effusion, not elsewhere classified: Secondary | ICD-10-CM

## 2016-08-21 DIAGNOSIS — E669 Obesity, unspecified: Secondary | ICD-10-CM | POA: Diagnosis present

## 2016-08-21 DIAGNOSIS — Z0189 Encounter for other specified special examinations: Secondary | ICD-10-CM

## 2016-08-21 DIAGNOSIS — A4101 Sepsis due to Methicillin susceptible Staphylococcus aureus: Secondary | ICD-10-CM | POA: Diagnosis present

## 2016-08-21 DIAGNOSIS — F1194 Opioid use, unspecified with opioid-induced mood disorder: Secondary | ICD-10-CM

## 2016-08-21 DIAGNOSIS — F1114 Opioid abuse with opioid-induced mood disorder: Secondary | ICD-10-CM | POA: Diagnosis present

## 2016-08-21 DIAGNOSIS — I10 Essential (primary) hypertension: Secondary | ICD-10-CM | POA: Diagnosis present

## 2016-08-21 DIAGNOSIS — E860 Dehydration: Secondary | ICD-10-CM | POA: Diagnosis present

## 2016-08-21 DIAGNOSIS — F1193 Opioid use, unspecified with withdrawal: Secondary | ICD-10-CM | POA: Diagnosis present

## 2016-08-21 DIAGNOSIS — F1920 Other psychoactive substance dependence, uncomplicated: Secondary | ICD-10-CM

## 2016-08-21 DIAGNOSIS — I248 Other forms of acute ischemic heart disease: Secondary | ICD-10-CM | POA: Diagnosis present

## 2016-08-21 DIAGNOSIS — S271XXA Traumatic hemothorax, initial encounter: Secondary | ICD-10-CM | POA: Diagnosis present

## 2016-08-21 DIAGNOSIS — B1920 Unspecified viral hepatitis C without hepatic coma: Secondary | ICD-10-CM | POA: Diagnosis present

## 2016-08-21 DIAGNOSIS — E876 Hypokalemia: Secondary | ICD-10-CM | POA: Diagnosis present

## 2016-08-21 DIAGNOSIS — E871 Hypo-osmolality and hyponatremia: Secondary | ICD-10-CM | POA: Diagnosis present

## 2016-08-21 DIAGNOSIS — J869 Pyothorax without fistula: Secondary | ICD-10-CM

## 2016-08-21 DIAGNOSIS — R0902 Hypoxemia: Secondary | ICD-10-CM | POA: Diagnosis present

## 2016-08-21 DIAGNOSIS — F1910 Other psychoactive substance abuse, uncomplicated: Secondary | ICD-10-CM

## 2016-08-21 DIAGNOSIS — J189 Pneumonia, unspecified organism: Secondary | ICD-10-CM | POA: Diagnosis present

## 2016-08-21 DIAGNOSIS — J9601 Acute respiratory failure with hypoxia: Secondary | ICD-10-CM | POA: Diagnosis present

## 2016-08-21 DIAGNOSIS — Z938 Other artificial opening status: Secondary | ICD-10-CM

## 2016-08-21 DIAGNOSIS — R0602 Shortness of breath: Secondary | ICD-10-CM

## 2016-08-21 DIAGNOSIS — F1123 Opioid dependence with withdrawal: Secondary | ICD-10-CM | POA: Diagnosis present

## 2016-08-21 DIAGNOSIS — D649 Anemia, unspecified: Secondary | ICD-10-CM | POA: Diagnosis present

## 2016-08-21 DIAGNOSIS — Z9689 Presence of other specified functional implants: Secondary | ICD-10-CM

## 2016-08-21 DIAGNOSIS — J939 Pneumothorax, unspecified: Secondary | ICD-10-CM

## 2016-08-21 DIAGNOSIS — J690 Pneumonitis due to inhalation of food and vomit: Secondary | ICD-10-CM | POA: Diagnosis present

## 2016-08-21 HISTORY — DX: Essential (primary) hypertension: I10

## 2016-08-21 LAB — BASIC METABOLIC PANEL
Anion gap: 11 (ref 5–15)
BUN: 9 mg/dL (ref 6–20)
CHLORIDE: 94 mmol/L — AB (ref 101–111)
CO2: 27 mmol/L (ref 22–32)
CREATININE: 1.03 mg/dL (ref 0.61–1.24)
Calcium: 8.8 mg/dL — ABNORMAL LOW (ref 8.9–10.3)
GFR calc non Af Amer: >60 mL/min (ref >60)
Glucose, Bld: 120 mg/dL — ABNORMAL HIGH (ref 65–99)
Potassium: 3.2 mmol/L — ABNORMAL LOW (ref 3.5–5.1)
Sodium: 132 mmol/L — ABNORMAL LOW (ref 135–145)

## 2016-08-21 LAB — CBC
HCT: 42.4 % (ref 39.0–52.0)
Hemoglobin: 14.5 g/dL (ref 13.0–17.0)
MCH: 30 pg (ref 26.0–34.0)
MCHC: 34.2 g/dL (ref 30.0–36.0)
MCV: 87.6 fL (ref 78.0–100.0)
PLATELETS: 334 10*3/uL (ref 150–400)
RBC: 4.84 MIL/uL (ref 4.22–5.81)
RDW: 12.4 % (ref 11.5–15.5)
WBC: 18.4 10*3/uL — ABNORMAL HIGH (ref 4.0–10.5)

## 2016-08-21 LAB — BRAIN NATRIURETIC PEPTIDE: B Natriuretic Peptide: 81.9 pg/mL (ref 0.0–100.0)

## 2016-08-21 LAB — I-STAT TROPONIN, ED: Troponin i, poc: 0.01 ng/mL (ref 0.00–0.08)

## 2016-08-21 LAB — I-STAT CG4 LACTIC ACID, ED: LACTIC ACID, VENOUS: 2.39 mmol/L — AB (ref 0.5–1.9)

## 2016-08-21 MED ORDER — SODIUM CHLORIDE 0.9 % IV BOLUS (SEPSIS)
1000.0000 mL | Freq: Once | INTRAVENOUS | Status: AC
  Administered 2016-08-21: 1000 mL via INTRAVENOUS

## 2016-08-21 MED ORDER — ALBUTEROL (5 MG/ML) CONTINUOUS INHALATION SOLN
10.0000 mg/h | INHALATION_SOLUTION | RESPIRATORY_TRACT | Status: DC
  Administered 2016-08-21: 10 mg/h via RESPIRATORY_TRACT
  Filled 2016-08-21: qty 20

## 2016-08-21 MED ORDER — ONDANSETRON HCL 4 MG/2ML IJ SOLN
4.0000 mg | Freq: Once | INTRAMUSCULAR | Status: AC
  Administered 2016-08-21: 4 mg via INTRAVENOUS
  Filled 2016-08-21: qty 2

## 2016-08-21 MED ORDER — SODIUM CHLORIDE 0.9 % IV BOLUS (SEPSIS): 1000.0000 mL | Freq: Once | INTRAVENOUS | Status: DC

## 2016-08-21 MED ORDER — DEXTROSE 5 % IV SOLN
500.0000 mg | Freq: Once | INTRAVENOUS | Status: AC
  Administered 2016-08-21: 500 mg via INTRAVENOUS
  Filled 2016-08-21: qty 500

## 2016-08-21 MED ORDER — IOPAMIDOL (ISOVUE-370) INJECTION 76%
INTRAVENOUS | Status: AC
  Administered 2016-08-21: 80 mL
  Filled 2016-08-21: qty 100

## 2016-08-21 MED ORDER — KETOROLAC TROMETHAMINE 30 MG/ML IJ SOLN
30.0000 mg | Freq: Once | INTRAMUSCULAR | Status: AC
  Administered 2016-08-21: 30 mg via INTRAVENOUS
  Filled 2016-08-21: qty 1

## 2016-08-21 MED ORDER — IPRATROPIUM-ALBUTEROL 0.5-2.5 (3) MG/3ML IN SOLN
3.0000 mL | Freq: Once | RESPIRATORY_TRACT | Status: AC
  Administered 2016-08-21: 3 mL via RESPIRATORY_TRACT
  Filled 2016-08-21: qty 3

## 2016-08-21 MED ORDER — MORPHINE SULFATE (PF) 4 MG/ML IV SOLN
4.0000 mg | Freq: Once | INTRAVENOUS | Status: AC
  Administered 2016-08-21: 4 mg via INTRAVENOUS
  Filled 2016-08-21: qty 1

## 2016-08-21 MED ORDER — DEXTROSE 5 % IV SOLN
1.0000 g | Freq: Once | INTRAVENOUS | Status: AC
  Administered 2016-08-21: 1 g via INTRAVENOUS
  Filled 2016-08-21: qty 10

## 2016-08-21 NOTE — ED Provider Notes (Signed)
[facility]-EMERGENCY DEPT Provider Note   CSN: [id number] Arrival date & time: 08/21/16  1840     History   Chief Complaint Chief Complaint  Patient presents with  . Chest Pain  . Shortness of Breath    HPI Bryan Jennings is a 25 y.o. male.  Bryan Jennings is a 25 y.o. male with h/o HTN and substance abuse presents to ED with complaint of SOB and CP. On 08/17/16 patient OD'd on heroin, approximately 20 minutes of CPR were performed by EMS. Patient at that time refused transfer to hospital. He was seen at [facility] on 08/18/16 for chest pain and shortness of breath. He was noted to have rib fractures on the right side, discharged with symptomatic treatment. He presents today with worsening chest pain, shortness of breath, and wheezing. He describes the chest pain as right sided, sharp in nature, and worse with deep inspiration. He endorses associated wheezing, nausea. He does cough, but states he tries not to secondary to pain. He denies fever, trouble swallowing, changes in vision, leg swelling, abdominal pain, vomiting, dysuria, hematuria, dizziness, lightheadedness, numbness, weakness, LOC.       Past Medical History:  Diagnosis Date  . Hypertension   . Substance abuse     Patient Active Problem List   Diagnosis Date Noted  . Pneumonia 08/21/2016  . S/P alcohol detoxification 11/04/2013  . Polysubstance dependence including opioid type drug, episodic abuse (HCC) 11/04/2013  . PTSD (post-traumatic stress disorder) 11/04/2013  . Depressive disorder, not elsewhere classified 11/04/2013    Past Surgical History:  Procedure Laterality Date  . APPENDECTOMY         Home Medications    Prior to Admission medications   Medication Sig Start Date End Date Taking? Authorizing Provider  hydrochlorothiazide (HYDRODIURIL) 25 MG tablet Take 12.5 mg by mouth daily.   Yes Historical Provider, MD  ibuprofen (ADVIL,MOTRIN) 800 MG tablet Take 800 mg by mouth every 6 (six) hours as needed for  moderate pain.   Yes Historical Provider, MD  traMADol (ULTRAM) 50 MG tablet Take 50 mg by mouth every 6 (six) hours as needed for severe pain.    Yes Historical Provider, MD  DULoxetine (CYMBALTA) 30 MG capsule Take 1 capsule (30 mg total) by mouth daily. For depression Patient not taking: Reported on 08/21/2016 11/06/13   Sanjuana Kava, NP  gabapentin (NEURONTIN) 300 MG capsule Take 1 capsule (300 mg total) by mouth 3 (three) times daily. For withdrawal related anxiety symptoms Patient not taking: Reported on 08/21/2016 11/06/13   Sanjuana Kava, NP  hydrOXYzine (ATARAX/VISTARIL) 25 MG tablet Take 1 tablet (25 mg) three times daily for anxiety Patient not taking: Reported on 08/21/2016 11/06/13   Sanjuana Kava, NP    Family History No family history on file.  Social History Social History  Substance Use Topics  . Smoking status: Current Every Day Smoker    Packs/day: 1.00    Years: 12.00    Types: Cigarettes  . Smokeless tobacco: Not on file  . Alcohol use Yes     Comment: etoh abuse     Allergies   Review of patient's allergies indicates no known allergies.   Review of Systems Review of Systems  Constitutional: Positive for diaphoresis. Negative for chills and fever.  HENT: Negative for trouble swallowing.   Eyes: Negative for visual disturbance.  Respiratory: Positive for shortness of breath.   Cardiovascular: Positive for chest pain.  Gastrointestinal: Positive for nausea. Negative for abdominal pain and vomiting.  Genitourinary: Negative for dysuria and hematuria.  Musculoskeletal: Negative for neck pain.  Skin: Positive for wound. Negative for rash.  Neurological: Negative for headaches.     Physical Exam Updated Vital Signs BP 131/85 (BP Location: Right Arm)   Pulse (!) 142   Temp 98.7 F (37.1 C) (Oral)   Resp (!) 36   Ht 5\' 10"  (1.778 m)   Wt 117.9 kg   SpO2 92%   BMI 37.31 kg/m   Physical Exam  Constitutional: He appears well-developed and  well-nourished. He appears distressed.  HENT:  Head: Normocephalic and atraumatic.  Mouth/Throat: Oropharynx is clear and moist. No oropharyngeal exudate.  Eyes: Conjunctivae and EOM are normal. Pupils are equal, round, and reactive to light. Right eye exhibits no discharge. Left eye exhibits no discharge. No scleral icterus.  Neck: Normal range of motion and phonation normal. Neck supple. No neck rigidity. Normal range of motion present.  Cardiovascular: Regular rhythm, normal heart sounds and intact distal pulses.  Tachycardia present.   No murmur heard. Pulmonary/Chest: No stridor. Tachypnea noted. No respiratory distress. He has wheezes. He has rhonchi. He has no rales. He exhibits tenderness.    Abdominal: Soft. Bowel sounds are normal. He exhibits no distension. There is no tenderness. There is no rigidity, no rebound, no guarding and no CVA tenderness.  Musculoskeletal: Normal range of motion.  Lymphadenopathy:    He has no cervical adenopathy.  Neurological: He is alert. He is not disoriented. Coordination and gait normal. GCS eye subscore is 4. GCS verbal subscore is 5. GCS motor subscore is 6.  Skin: Skin is warm. He is diaphoretic.  Linear scabs noted on arms b/l.  Psychiatric: He has a normal mood and affect. His behavior is normal.     ED Treatments / Results  Labs (all labs ordered are listed, but only abnormal results are displayed) Labs Reviewed  BASIC METABOLIC PANEL - Abnormal; Notable for the following:       Result Value   Sodium 132 (*)    Potassium 3.2 (*)    Chloride 94 (*)    Glucose, Bld 120 (*)    Calcium 8.8 (*)    All other components within normal limits  CBC - Abnormal; Notable for the following:    WBC 18.4 (*)    All other components within normal limits  I-STAT CG4 LACTIC ACID, ED - Abnormal; Notable for the following:    Lactic Acid, Venous 2.39 (*)    All other components within normal limits  CULTURE, BLOOD (ROUTINE X 2)  CULTURE, BLOOD  (ROUTINE X 2)  URINE CULTURE  BRAIN NATRIURETIC PEPTIDE  URINALYSIS, ROUTINE W REFLEX MICROSCOPIC (NOT AT [facility])  Rosezena Sensor, ED    EKG  EKG Interpretation  Date/Time:  Tuesday August 21 2016 18:50:42 EDT Ventricular Rate:  142 PR Interval:  120 QRS Duration: 86 QT Interval:  290 QTC Calculation: 446 R Axis:   66 Text Interpretation:  Sinus tachycardia T wave abnormality, consider inferior ischemia Abnormal ECG Since last tracing rate faster Confirmed by KNAPP  MD-J, JON ([id number]) on 08/21/2016 7:02:21 PM       Radiology Ct Angio Chest Pe W/cm &/or Wo Cm  Result Date: 08/21/2016 CLINICAL DATA:  25 year old male with tachycardia and tachypnea. EXAM: CT ANGIOGRAPHY CHEST WITH CONTRAST TECHNIQUE: Multidetector CT imaging of the chest was performed using the standard protocol during bolus administration of intravenous contrast. Multiplanar CT image reconstructions and MIPs were obtained to evaluate the vascular anatomy. CONTRAST:  80 cc Isovue 370 COMPARISON:  Chest radiograph dated 08/21/2016 FINDINGS: Evaluation of this exam is limited due to respiratory motion artifact. Cardiovascular: The thoracic aorta appears unremarkable. No dissection or aneurysm. The origins of the great vessels of the aortic arch appear patent. Evaluation of the pulmonary arteries is limited due to respiratory motion artifact. No definite central pulmonary artery embolus identified. Top-normal cardiac size. No pericardial effusion. Mediastinum/Nodes: There is no hilar or mediastinal adenopathy. The esophagus is grossly unremarkable. No thyroid nodules identified. Lungs/Pleura: There is a small right pleural effusion. Patchy area of consolidative change involving the right lung base in the right middle and right lower lobe may represent atelectasis versus pneumonia. Left lung base linear and hazy densities may be atelectatic changes or infiltrate. There is no pleural effusion on the left. No pneumothorax. The  central airways are patent. Upper Abdomen: No acute abnormality. Musculoskeletal: No chest wall abnormality. No acute or significant osseous findings. Review of the MIP images confirms the above findings. IMPRESSION: Limited evaluation for pulmonary embolism due to respiratory motion artifact. No definite central pulmonary artery embolus identified. Small right pleural effusion with right lung base atelectasis versus pneumonia. Small patchy left lung base density may represent atelectatic changes versus infiltrate. Clinical correlation and follow-up to resolution recommended. Electronically Signed   By: Elgie Collard M.D.   On: 08/21/2016 22:37   Dg Chest Portable 1 View  Result Date: 08/21/2016 CLINICAL DATA:  Dyspnea and chest pain for 2 days. Diagnosed with right rib fracture 08/18/2016. EXAM: PORTABLE CHEST 1 VIEW COMPARISON:  08/18/2016. FINDINGS: There is new platelike atelectasis at the right lung base. There is no focal pulmonary consolidation pneumothorax. Heart and mediastinal contours are unremarkable. Low lung volumes on current exam. No acute displaced appearing fracture is identified. With IMPRESSION: Right lower lobe atelectasis. Electronically Signed   By: Tollie Eth M.D.   On: 08/21/2016 19:35    Procedures Procedures (including critical care time)  Medications Ordered in ED Medications  albuterol (PROVENTIL,VENTOLIN) solution continuous neb (0 mg/hr Nebulization Stopped 08/21/16 2212)  azithromycin (ZITHROMAX) 500 mg in dextrose 5 % 250 mL IVPB (500 mg Intravenous New Bag/Given 08/21/16 2314)  ipratropium-albuterol (DUONEB) 0.5-2.5 (3) MG/3ML nebulizer solution 3 mL (3 mLs Nebulization Given 08/21/16 1938)  morphine 4 MG/ML injection 4 mg (4 mg Intravenous Given 08/21/16 1940)  ondansetron (ZOFRAN) injection 4 mg (4 mg Intravenous Given 08/21/16 2029)  sodium chloride 0.9 % bolus 1,000 mL (0 mLs Intravenous Stopped 08/21/16 2311)  sodium chloride 0.9 % bolus 1,000 mL (1,000  mLs Intravenous New Bag/Given 08/21/16 2206)    And  sodium chloride 0.9 % bolus 1,000 mL (1,000 mLs Intravenous New Bag/Given 08/21/16 2206)    And  sodium chloride 0.9 % bolus 1,000 mL (1,000 mLs Intravenous New Bag/Given 08/21/16 2206)  cefTRIAXone (ROCEPHIN) 1 g in dextrose 5 % 50 mL IVPB (0 g Intravenous Stopped 08/21/16 2311)  iopamidol (ISOVUE-370) 76 % injection (80 mLs  Contrast Given 08/21/16 2217)  ketorolac (TORADOL) 30 MG/ML injection 30 mg (30 mg Intravenous Given 08/21/16 2309)     Initial Impression / Assessment and Plan / ED Course  I have reviewed the triage vital signs and the nursing notes.  Pertinent labs & imaging results that were available during my care of the patient were reviewed by me and considered in my medical decision making (see chart for details).  Clinical Course  Value Comment By Time   Seen and examined.  Pt is tachycardic and tachypneic.  Has bilateral breath sounds on exam.  Wheezing bilaterally.  Will order fluids, labs, breathing treatment, pain meds and PCXR. Linwood DibblesJon Knapp, MD 10/17 [phone]   Patient endorses improvement in breathing. Remains tachypneic and tachycardic. Wheezing and rhonchi on exam. Will do continuous neb Lona Kettleshley Laurel Meyer, [place]-C 10/17 2012  CT Angio Chest PE W/Cm &/Or Wo Cm Reviewed Lona KettleAshley Laurel Meyer, PA-C 10/17 2300    Patient presents to ED with worsening SOB and CP. Patient is afebrile and non-toxic appearing in NAD. He is tachypneic, tachycardic, and hypoxic on RA. 3L  initiated with improvement in O2 sats to >94%. Lungs sound b/l; however, wheezing and rhonchi appreciated. Linear scabs noted to arms b/l. Will check basic labs, EKG, and 1 view CXR. Fluids, pain medication, and breathing treatment initiated. Patient will likely need admission given hypoxia.   On EKG sinus tachycardia present with t-wave abnormality, consider ischemia. Portable x-ray remarkable for low lung volumes and atelectasis, no PTX identified. Troponin  negative. BNP negative. CBC remarkable for leukocytosis. BMP remarkable for hyponatremia, hypochloremia - ?dehydration. On re-evaluation patient endorses slight improvement in breathing, still tachypneic and tachycardic. Will order continuous nebulizer. Will check CTA to r/o PE and further evaluate lung fields. With leukocytosis concern for possible PNA vs. Endocarditis given IVDU. Will also check lactic acid, blood cx, urine, and urine cx.   Patient continues to endorse improvement in breathing after continuous nebulizer, remains tachycardic and tachypneic. Endorses headache, pain medication placed. Lactic acid elevated at 2.39. CTA negative for definitive PE; however, patchy/consolidative changes noted in right lower lobe and middle lobe and left lower lobe, question of atelectasis vs. PNA. Given WBC count will treat for PNA. Given tachycardia, tachypnea, WBC and CT findings patient meets sepsis criteria. Consult to hospitalist for admission for sepsis most likely secondary to PNA and hypoxia most likely secondary to PNA.   11:27 PM: Dr. Lynelle DoctorKnapp [facility], greatly appreciate her time and input. Agree to admit patient for further management of sepsis and hypoxia most likely secondary to PNA.    Final Clinical Impressions(s) / ED Diagnoses   Final diagnoses:  Community acquired pneumonia, unspecified laterality  Substance abuse  Hypoxia  Sepsis, due to unspecified organism [facility](HCC)    New Prescriptions New Prescriptions   No medications on file     Lona Kettleshley Laurel Meyer, PA-C 08/21/16 2331    Linwood DibblesJon Knapp, MD 08/23/16 2258

## 2016-08-21 NOTE — ED Notes (Signed)
Notified respiratory about CAT

## 2016-08-21 NOTE — ED Notes (Signed)
Pt returned from ct and placed on monitor, phlebotomy at bedside drawing lactic acid

## 2016-08-21 NOTE — ED Triage Notes (Signed)
Patient complains of shortness of breath and chest pain x 2 days. [facility] and required 20 minutes of CPR. Today audible wheezing noted on arrival and dyspnea with CP. Unable to speak complete sentences. HR 140-150. Denies any drug use since OD.

## 2016-08-21 NOTE — H&P (Signed)
History and Physical    Bryan Jennings ZOX:[id number] DOB: 1991/08/12 DOA: 08/21/2016  PCP: No PCP Per Patient Consultants:  None Patient coming from: home - lives with brother; NOK: brother, [phone]  Chief Complaint: CP, SOB  HPI: Bryan Jennings is a 25 y.o. male with medical history significant of HTN and polysubstance abuse presenting with rib pain and SOB.  Patient reports that he cracked his rib the other day and his breathing got really bad so he came to the ER.  +SOB, pain.  Upon further questioning, he reports that he overdosed on heroin on 10/13 and a bystander performed CPR.  He was offered hospital transport and he refused.  No further use since then.  Has had pain since the heroin wore off later that night and it is ongoing.  SOB worsened today, wheezing.  He believes that he has broken ribs which are contributing to the pain.  Prior h/o heroin abuse, recently relapsed.  Denies use of other drugs.   ED Course:  Per PA-C Meyer: Patient presents to ED with worsening SOB and CP. Patient is afebrile and non-toxic appearing in NAD. He is tachypneic, tachycardic, and hypoxic on RA. 3L [place] initiated with improvement in O2 sats to >94%. Lungs sound b/l; however, wheezing and rhonchi appreciated. Linear scabs noted to arms b/l. Will check basic labs, EKG, and 1 view CXR. Fluids, pain medication, and breathing treatment initiated. Patient will likely need admission given hypoxia.   On EKG sinus tachycardia present with t-wave abnormality, consider ischemia. Portable x-ray remarkable for low lung volumes and atelectasis, no PTX identified. Troponin negative. BNP negative. CBC remarkable for leukocytosis. BMP remarkable for hyponatremia, hypochloremia - ?dehydration. On re-evaluation patient endorses slight improvement in breathing, still tachypneic and tachycardic. Will order continuous nebulizer. Will check CTA to r/o PE and further evaluate lung fields. With leukocytosis concern for possible  PNA vs. Endocarditis given IVDU. Will also check lactic acid, blood cx, urine, and urine cx.   Patient continues to endorse improvement in breathing after continuous nebulizer, remains tachycardic and tachypneic. Endorses headache, pain medication placed. Lactic acid elevated at 2.39. CTA negative for definitive PE; however, patchy/consolidative changes noted in right lower lobe and middle lobe and left lower lobe, question of atelectasis vs. PNA. Given WBC count will treat for PNA. Given tachycardia, tachypnea, WBC and CT findings patient meets sepsis criteria. Consult to hospitalist for admission for sepsis most likely secondary to PNA and hypoxia most likely secondary to PNA.   11:27 PM: Dr. Lynelle Doctor spoke with Dr. Ophelia Charter of [facility], greatly appreciate her time and input. Agree to admit patient for further management of sepsis and hypoxia most likely secondary to PNA.   Review of Systems: As per HPI; otherwise 10 point review of systems reviewed and negative.   Ambulatory Status:  Ambulates without difficulty  Past Medical History:  Diagnosis Date  . Hypertension   . Substance abuse     Past Surgical History:  Procedure Laterality Date  . APPENDECTOMY      Social History   Social History  . Marital status: Single    Spouse name: N/A  . Number of children: N/A  . Years of education: N/A   Occupational History  . remodels apartments    Social History Main Topics  . Smoking status: Current Every Day Smoker    Packs/day: 1.00    Years: 12.00    Types: Cigarettes  . Smokeless tobacco: Never Used  . Alcohol use Yes  Comment: etoh abuse - reports unsure when he last used, "I just don't like it"  . Drug use:     Types: Cocaine, Heroin, Benzodiazepines     Comment: reports last cocaine use was months ago, last heroin use on 10/13  . Sexual activity: Yes    Birth control/ protection: Condom   Other Topics Concern  . Not on file   Social History Narrative  . No narrative on  file    No Known Allergies  Family History  Problem Relation Age of Onset  . Diabetes Mother   . Hypertension Mother   . CAD Father [id number]    Prior to Admission medications   Medication Sig Start Date End Date Taking? Authorizing Provider  hydrochlorothiazide (HYDRODIURIL) 25 MG tablet Take 12.5 mg by mouth daily.   Yes Historical Provider, MD  ibuprofen (ADVIL,MOTRIN) 800 MG tablet Take 800 mg by mouth every 6 (six) hours as needed for moderate pain.   Yes Historical Provider, MD  traMADol (ULTRAM) 50 MG tablet Take 50 mg by mouth every 6 (six) hours as needed for severe pain.    Yes Historical Provider, MD  DULoxetine (CYMBALTA) 30 MG capsule Take 1 capsule (30 mg total) by mouth daily. For depression Patient not taking: Reported on 08/21/2016 11/06/13   Sanjuana KavaAgnes I Nwoko, NP  gabapentin (NEURONTIN) 300 MG capsule Take 1 capsule (300 mg total) by mouth 3 (three) times daily. For withdrawal related anxiety symptoms Patient not taking: Reported on 08/21/2016 11/06/13   Sanjuana KavaAgnes I Nwoko, NP  hydrOXYzine (ATARAX/VISTARIL) 25 MG tablet Take 1 tablet (25 mg) three times daily for anxiety Patient not taking: Reported on 08/21/2016 11/06/13   Sanjuana KavaAgnes I Nwoko, NP    Physical Exam: Vitals:   08/22/16 0000 08/22/16 0015 08/22/16 0030 08/22/16 0045  BP: (!) 100/54 112/62 108/67 109/63  Pulse: (!) 122 (!) 129 (!) 122 115  Resp: (!) 29  25 (!) 29  Temp:      TempSrc:      SpO2: 95% 94% 95% 96%  Weight:      Height:         General: Appears anxious, uncomfortable, diaphoretic Eyes:  PERRL, EOMI, normal lids, iris ENT:  grossly normal hearing, lips & tongue, mmm Neck:  no LAD, masses or thyromegaly Cardiovascular:  Tachycardia, no m/r/g. No LE edema.  Respiratory:  CTA bilaterally, no w/r/r. Normal respiratory effort but increased respiratory rate. Abdomen:  soft, ntnd, NABS Skin:  no rash or induration seen on limited exam Musculoskeletal:  grossly normal tone BUE/BLE, good ROM, no bony  abnormality Psychiatric:  grossly normal mood and affect, speech fluent and appropriate, AOx3 Neurologic:  CN 2-12 grossly intact, moves all extremities in coordinated fashion, sensation intact  Labs on Admission: I have personally reviewed following labs and imaging studies  CBC:  Recent Labs Lab 08/21/16 2023  WBC 18.4*  HGB 14.5  HCT 42.4  MCV 87.6  PLT 334   Basic Metabolic Panel:  Recent Labs Lab 08/21/16 2023  NA 132*  K 3.2*  CL 94*  CO2 27  GLUCOSE 120*  BUN 9  CREATININE 1.03  CALCIUM 8.8*   GFR: Estimated Creatinine Clearance: 141.1 mL/min (by C-G formula based on SCr of 1.03 mg/dL). Liver Function Tests: No results for input(s): AST, ALT, ALKPHOS, BILITOT, PROT, ALBUMIN in the last 168 hours. No results for input(s): LIPASE, AMYLASE in the last 168 hours. No results for input(s): AMMONIA in the last 168 hours. Coagulation Profile: No results  for input(s): INR, PROTIME in the last 168 hours. Cardiac Enzymes: No results for input(s): CKTOTAL, CKMB, CKMBINDEX, TROPONINI in the last 168 hours. BNP (last 3 results) No results for input(s): PROBNP in the last 8760 hours. HbA1C: No results for input(s): HGBA1C in the last 72 hours. CBG: No results for input(s): GLUCAP in the last 168 hours. Lipid Profile: No results for input(s): CHOL, HDL, LDLCALC, TRIG, CHOLHDL, LDLDIRECT in the last 72 hours. Thyroid Function Tests: No results for input(s): TSH, T4TOTAL, FREET4, T3FREE, THYROIDAB in the last 72 hours. Anemia Panel: No results for input(s): VITAMINB12, FOLATE, FERRITIN, TIBC, IRON, RETICCTPCT in the last 72 hours. Urine analysis: No results found for: COLORURINE, APPEARANCEUR, LABSPEC, PHURINE, GLUCOSEU, HGBUR, BILIRUBINUR, KETONESUR, PROTEINUR, UROBILINOGEN, NITRITE, LEUKOCYTESUR  Creatinine Clearance: Estimated Creatinine Clearance: 141.1 mL/min (by C-G formula based on SCr of 1.03 mg/dL).  Sepsis Labs: @LABRCNTIP (procalcitonin:4,lacticidven:4) )No  results found for this or any previous visit (from the past 240 hour(s)).   Radiological Exams on Admission: Ct Angio Chest Pe W/cm &/or Wo Cm  Result Date: 08/21/2016 CLINICAL DATA:  25 year old male with tachycardia and tachypnea. EXAM: CT ANGIOGRAPHY CHEST WITH CONTRAST TECHNIQUE: Multidetector CT imaging of the chest was performed using the standard protocol during bolus administration of intravenous contrast. Multiplanar CT image reconstructions and MIPs were obtained to evaluate the vascular anatomy. CONTRAST:  80 cc Isovue 370 COMPARISON:  Chest radiograph dated 08/21/2016 FINDINGS: Evaluation of this exam is limited due to respiratory motion artifact. Cardiovascular: The thoracic aorta appears unremarkable. No dissection or aneurysm. The origins of the great vessels of the aortic arch appear patent. Evaluation of the pulmonary arteries is limited due to respiratory motion artifact. No definite central pulmonary artery embolus identified. Top-normal cardiac size. No pericardial effusion. Mediastinum/Nodes: There is no hilar or mediastinal adenopathy. The esophagus is grossly unremarkable. No thyroid nodules identified. Lungs/Pleura: There is a small right pleural effusion. Patchy area of consolidative change involving the right lung base in the right middle and right lower lobe may represent atelectasis versus pneumonia. Left lung base linear and hazy densities may be atelectatic changes or infiltrate. There is no pleural effusion on the left. No pneumothorax. The central airways are patent. Upper Abdomen: No acute abnormality. Musculoskeletal: No chest wall abnormality. No acute or significant osseous findings. Review of the MIP images confirms the above findings. IMPRESSION: Limited evaluation for pulmonary embolism due to respiratory motion artifact. No definite central pulmonary artery embolus identified. Small right pleural effusion with right lung base atelectasis versus pneumonia. Small patchy  left lung base density may represent atelectatic changes versus infiltrate. Clinical correlation and follow-up to resolution recommended. Electronically Signed   By: Elgie Collard M.D.   On: 08/21/2016 22:37   Dg Chest Portable 1 View  Result Date: 08/21/2016 CLINICAL DATA:  Dyspnea and chest pain for 2 days. Diagnosed with right rib fracture 08/18/2016. EXAM: PORTABLE CHEST 1 VIEW COMPARISON:  08/18/2016. FINDINGS: There is new platelike atelectasis at the right lung base. There is no focal pulmonary consolidation pneumothorax. Heart and mediastinal contours are unremarkable. Low lung volumes on current exam. No acute displaced appearing fracture is identified. With IMPRESSION: Right lower lobe atelectasis. Electronically Signed   By: Tollie Eth M.D.   On: 08/21/2016 19:35    EKG: Unable to view in Epic.  Per Dr. Lynelle Doctor, sinus tachycardia, rate 142, T wave abnormality, consider inferior ischemia  Assessment/Plan Principal Problem:   Sepsis (HCC) Active Problems:   Polysubstance dependence including opioid type drug, episodic abuse (  HCC)   Pneumonia   Opiate withdrawal (HCC)   Hyponatremia   Hypokalemia   Sepsis from probable CAP -Elevated WBC count, tachycardia, tachypnea with elevated lactate to 2.39 and borderline hypotension -Sepsis protocol initiated -While sepsis is clearly of concern, opiate withdrawal is also a reasonable explanation for symptoms -Possible CAP as source - has been splinting due to rib pain (no apparent fractures from CTPA or CXR despite reported CPR at time of overdose) -Blood and urine cultures pending -Will admit with telemetry and continue to monitor -Treat with IV Rocephin and Azithromycin -Will add HIV -Will check strep pneumo  -Will trend lactate to ensure improvement  Polysubstance abuse with opiate withdrawal -Suspect that this is a large component of the current situation -Patient with reported heroin OD and reported bystander CPR -He may be  experiencing opiate withdrawal as cause for tachycardia, tachypnea, decreased fluid intake leading to hyponatremia/hypokalemia and thus leading to elevated lactate -Would be judicious in treatment with further opiates -For now, pain management with Tramadol (limited amount), Toradol -Patient is interested in detox, have requested psych consult for admission through consult line -Symptomatic care  HTN -Hold HCTZ due to hyponatremia, mild volume deficiency   DVT prophylaxis: Lovenox  Code Status:  Full - confirmed with patient Family Communication: None present  Disposition Plan:  Home once clinically improved Consults called: Psychiatry  Admission status: Admit - It is my clinical opinion that admission to INPATIENT is reasonable and necessary because this patient will require at least 2 midnights in the hospital to treat this condition based on the medical complexity of the problems presented.  Given the aforementioned information, the predictability of an adverse outcome is felt to be significant.     Jonah Blue MD [facility]  If 7PM-7AM, please contact night-coverage [email or web address] Password [email or web address]  08/22/2016, 1:34 AM

## 2016-08-22 ENCOUNTER — Encounter (HOSPITAL_COMMUNITY): Payer: Self-pay | Admitting: Internal Medicine

## 2016-08-22 DIAGNOSIS — F1120 Opioid dependence, uncomplicated: Secondary | ICD-10-CM

## 2016-08-22 DIAGNOSIS — F1123 Opioid dependence with withdrawal: Secondary | ICD-10-CM

## 2016-08-22 DIAGNOSIS — A4101 Sepsis due to Methicillin susceptible Staphylococcus aureus: Secondary | ICD-10-CM | POA: Diagnosis present

## 2016-08-22 DIAGNOSIS — F1193 Opioid use, unspecified with withdrawal: Secondary | ICD-10-CM | POA: Diagnosis present

## 2016-08-22 DIAGNOSIS — F17210 Nicotine dependence, cigarettes, uncomplicated: Secondary | ICD-10-CM

## 2016-08-22 DIAGNOSIS — I10 Essential (primary) hypertension: Secondary | ICD-10-CM | POA: Diagnosis present

## 2016-08-22 DIAGNOSIS — Z833 Family history of diabetes mellitus: Secondary | ICD-10-CM

## 2016-08-22 DIAGNOSIS — F1920 Other psychoactive substance dependence, uncomplicated: Secondary | ICD-10-CM

## 2016-08-22 DIAGNOSIS — F1194 Opioid use, unspecified with opioid-induced mood disorder: Secondary | ICD-10-CM

## 2016-08-22 DIAGNOSIS — E871 Hypo-osmolality and hyponatremia: Secondary | ICD-10-CM | POA: Diagnosis present

## 2016-08-22 DIAGNOSIS — Z79899 Other long term (current) drug therapy: Secondary | ICD-10-CM

## 2016-08-22 DIAGNOSIS — E876 Hypokalemia: Secondary | ICD-10-CM | POA: Diagnosis present

## 2016-08-22 DIAGNOSIS — Z8249 Family history of ischemic heart disease and other diseases of the circulatory system: Secondary | ICD-10-CM

## 2016-08-22 LAB — CBC
HEMATOCRIT: 37.6 % — AB (ref 39.0–52.0)
HEMOGLOBIN: 12.5 g/dL — AB (ref 13.0–17.0)
MCH: 29.1 pg (ref 26.0–34.0)
MCHC: 33.2 g/dL (ref 30.0–36.0)
MCV: 87.6 fL (ref 78.0–100.0)
Platelets: 278 10*3/uL (ref 150–400)
RBC: 4.29 MIL/uL (ref 4.22–5.81)
RDW: 12.5 % (ref 11.5–15.5)
WBC: 16.1 10*3/uL — ABNORMAL HIGH (ref 4.0–10.5)

## 2016-08-22 LAB — BASIC METABOLIC PANEL
ANION GAP: 9 (ref 5–15)
BUN: 7 mg/dL (ref 6–20)
CO2: 21 mmol/L — AB (ref 22–32)
Calcium: 7.7 mg/dL — ABNORMAL LOW (ref 8.9–10.3)
Chloride: 103 mmol/L (ref 101–111)
Creatinine, Ser: 0.91 mg/dL (ref 0.61–1.24)
GFR calc non Af Amer: >60 mL/min (ref >60)
GLUCOSE: 139 mg/dL — AB (ref 65–99)
POTASSIUM: 3.3 mmol/L — AB (ref 3.5–5.1)
Sodium: 133 mmol/L — ABNORMAL LOW (ref 135–145)

## 2016-08-22 LAB — RAPID URINE DRUG SCREEN, HOSP PERFORMED
AMPHETAMINES: NOT DETECTED
Barbiturates: NOT DETECTED
Benzodiazepines: NOT DETECTED
COCAINE: NOT DETECTED
OPIATES: POSITIVE — AB
TETRAHYDROCANNABINOL: NOT DETECTED

## 2016-08-22 LAB — URINALYSIS, ROUTINE W REFLEX MICROSCOPIC
BILIRUBIN URINE: NEGATIVE
Glucose, UA: NEGATIVE mg/dL
HGB URINE DIPSTICK: NEGATIVE
KETONES UR: NEGATIVE mg/dL
Leukocytes, UA: NEGATIVE
NITRITE: NEGATIVE
PH: 6 (ref 5.0–8.0)
Protein, ur: NEGATIVE mg/dL
Specific Gravity, Urine: 1.035 — ABNORMAL HIGH (ref 1.005–1.030)

## 2016-08-22 LAB — STREP PNEUMONIAE URINARY ANTIGEN: STREP PNEUMO URINARY ANTIGEN: NEGATIVE

## 2016-08-22 LAB — HIV ANTIBODY (ROUTINE TESTING W REFLEX): HIV Screen 4th Generation wRfx: NONREACTIVE

## 2016-08-22 LAB — TROPONIN I
Troponin I: 0.06 ng/mL (ref <0.03)
Troponin I: <0.03 ng/mL (ref <0.03)

## 2016-08-22 LAB — I-STAT CG4 LACTIC ACID, ED: Lactic Acid, Venous: 1.9 mmol/L (ref 0.5–1.9)

## 2016-08-22 MED ORDER — DULOXETINE HCL 30 MG PO CPEP
30.0000 mg | ORAL_CAPSULE | Freq: Every day | ORAL | Status: DC
  Administered 2016-08-23 – 2016-08-25 (×3): 30 mg via ORAL
  Filled 2016-08-22 (×3): qty 1

## 2016-08-22 MED ORDER — HYDROXYZINE HCL 25 MG PO TABS
25.0000 mg | ORAL_TABLET | Freq: Three times a day (TID) | ORAL | Status: DC | PRN
  Administered 2016-08-26 – 2016-08-29 (×8): 25 mg via ORAL
  Filled 2016-08-22 (×9): qty 1

## 2016-08-22 MED ORDER — ONDANSETRON HCL 4 MG PO TABS
4.0000 mg | ORAL_TABLET | Freq: Four times a day (QID) | ORAL | Status: DC | PRN
  Administered 2016-08-26: 4 mg via ORAL
  Filled 2016-08-22: qty 1

## 2016-08-22 MED ORDER — HYDROXYZINE HCL 25 MG PO TABS: 25.0000 mg | ORAL_TABLET | Freq: Four times a day (QID) | ORAL | Status: DC | PRN

## 2016-08-22 MED ORDER — TRAMADOL HCL 50 MG PO TABS
50.0000 mg | ORAL_TABLET | Freq: Four times a day (QID) | ORAL | Status: DC | PRN
  Administered 2016-08-22 – 2016-08-25 (×6): 50 mg via ORAL
  Filled 2016-08-22 (×6): qty 1

## 2016-08-22 MED ORDER — DEXTROSE 5 % IV SOLN
1.0000 g | INTRAVENOUS | Status: DC
  Administered 2016-08-22 – 2016-08-23 (×2): 1 g via INTRAVENOUS
  Filled 2016-08-22 (×3): qty 10

## 2016-08-22 MED ORDER — ACETAMINOPHEN 325 MG PO TABS
650.0000 mg | ORAL_TABLET | Freq: Four times a day (QID) | ORAL | Status: DC | PRN
  Administered 2016-08-22 – 2016-08-28 (×3): 650 mg via ORAL
  Filled 2016-08-22 (×3): qty 2

## 2016-08-22 MED ORDER — DICYCLOMINE HCL 20 MG PO TABS: 20.0000 mg | ORAL_TABLET | Freq: Four times a day (QID) | ORAL | Status: AC | PRN

## 2016-08-22 MED ORDER — GABAPENTIN 600 MG PO TABS
300.0000 mg | ORAL_TABLET | Freq: Three times a day (TID) | ORAL | Status: DC
  Administered 2016-08-22 – 2016-08-28 (×20): 300 mg via ORAL
  Filled 2016-08-22 (×20): qty 1

## 2016-08-22 MED ORDER — KETOROLAC TROMETHAMINE 30 MG/ML IJ SOLN
30.0000 mg | Freq: Three times a day (TID) | INTRAMUSCULAR | Status: DC | PRN
  Administered 2016-08-22 – 2016-08-27 (×13): 30 mg via INTRAVENOUS
  Filled 2016-08-22 (×13): qty 1

## 2016-08-22 MED ORDER — AZITHROMYCIN 500 MG PO TABS
500.0000 mg | ORAL_TABLET | ORAL | Status: AC
  Administered 2016-08-22 – 2016-08-27 (×6): 500 mg via ORAL
  Filled 2016-08-22 (×6): qty 1

## 2016-08-22 MED ORDER — CLONIDINE HCL 0.1 MG PO TABS
0.1000 mg | ORAL_TABLET | Freq: Four times a day (QID) | ORAL | Status: AC
  Administered 2016-08-22 – 2016-08-24 (×10): 0.1 mg via ORAL
  Filled 2016-08-22 (×9): qty 1

## 2016-08-22 MED ORDER — ONDANSETRON HCL 4 MG/2ML IJ SOLN
4.0000 mg | Freq: Four times a day (QID) | INTRAMUSCULAR | Status: DC | PRN
  Filled 2016-08-22: qty 2

## 2016-08-22 MED ORDER — ACETAMINOPHEN 650 MG RE SUPP: 650.0000 mg | Freq: Four times a day (QID) | RECTAL | Status: DC | PRN

## 2016-08-22 MED ORDER — LOPERAMIDE HCL 2 MG PO CAPS
2.0000 mg | ORAL_CAPSULE | ORAL | Status: AC | PRN
  Administered 2016-08-26 – 2016-08-27 (×3): 2 mg via ORAL
  Filled 2016-08-22 (×3): qty 1

## 2016-08-22 MED ORDER — CLONIDINE HCL 0.1 MG PO TABS
0.1000 mg | ORAL_TABLET | ORAL | Status: AC
  Administered 2016-08-24 – 2016-08-26 (×4): 0.1 mg via ORAL
  Filled 2016-08-22 (×4): qty 1

## 2016-08-22 MED ORDER — DEXTROSE 5 % IV SOLN
500.0000 mg | Freq: Four times a day (QID) | INTRAVENOUS | Status: DC | PRN
  Administered 2016-08-25: 500 mg via INTRAVENOUS
  Filled 2016-08-22 (×4): qty 5

## 2016-08-22 MED ORDER — SODIUM CHLORIDE 0.9% FLUSH
3.0000 mL | Freq: Two times a day (BID) | INTRAVENOUS | Status: DC
  Administered 2016-08-22 – 2016-08-28 (×15): 3 mL via INTRAVENOUS

## 2016-08-22 MED ORDER — ENOXAPARIN SODIUM 60 MG/0.6ML ~~LOC~~ SOLN
60.0000 mg | SUBCUTANEOUS | Status: DC
  Administered 2016-08-22 – 2016-08-28 (×6): 60 mg via SUBCUTANEOUS
  Filled 2016-08-22 (×7): qty 0.6

## 2016-08-22 MED ORDER — KETOROLAC TROMETHAMINE 30 MG/ML IJ SOLN: 30.0000 mg | Freq: Four times a day (QID) | INTRAMUSCULAR | Status: DC | PRN

## 2016-08-22 MED ORDER — CLONIDINE HCL 0.1 MG PO TABS
0.1000 mg | ORAL_TABLET | Freq: Every day | ORAL | Status: AC
Start: 2016-08-27 — End: 2016-08-28
  Administered 2016-08-27 – 2016-08-28 (×2): 0.1 mg via ORAL
  Filled 2016-08-22 (×2): qty 1

## 2016-08-22 MED ORDER — POTASSIUM CHLORIDE IN NACL 40-0.9 MEQ/L-% IV SOLN
INTRAVENOUS | Status: DC
  Administered 2016-08-22 – 2016-08-23 (×4): 100 mL/h via INTRAVENOUS
  Filled 2016-08-22 (×4): qty 1000

## 2016-08-22 NOTE — Progress Notes (Signed)
After speaking with IT, the COWS has now been wrenched into Doc Flowsheets, so that nursing staff may document on pt, as per order.

## 2016-08-22 NOTE — Progress Notes (Signed)
Patient HR to 140's-120's-112 sustaining  and Troponin of 0.06.Patient asymptomatic.  Dr. Edward JollySilva notified. No new orders noted. Nextt shift nurse made aware.

## 2016-08-22 NOTE — ED Notes (Signed)
Report was called to [facility] and patient is stable for transport.  Patient is no obvious sings of distress at this time.  Belongings taken to floor with patient.

## 2016-08-22 NOTE — Consult Note (Signed)
[facility] Consult   Reason for Consult:  Opioid withdrawal Referring Physician:  Dr. Patrecia Pour Patient Identification: Bryan Jennings MRN:  [id number] Principal Diagnosis: Sepsis [facility]) Diagnosis:   Patient Active Problem List   Diagnosis Date Noted  . Sepsis ([place]) [A41.9] 08/22/2016  . Opiate withdrawal ([place]) [F11.23] 08/22/2016  . Hyponatremia [E87.1] 08/22/2016  . Hypokalemia [E87.6] 08/22/2016  . Essential hypertension [I10] 08/22/2016  . Community acquired pneumonia [J18.9] 08/21/2016  . S/P alcohol detoxification [Z09] 11/04/2013  . Polysubstance dependence including opioid type drug, episodic abuse ([place]) [F11.20, F19.20] 11/04/2013  . PTSD (post-traumatic stress disorder) [F43.10] 11/04/2013  . Depressive disorder, not elsewhere classified [F32.9] 11/04/2013    Total Time spent with patient: 1 hour  Subjective:   Bryan Jennings is a 25 y.o. male patient admitted with Rib pain and shortness of breath secondary to CPR done 2 days ago.  HPI:  Bryan Jennings  is a 25 years old male admitted to [facility] with rib pain and shortness of breath secondary to CPR done 2 days ago by a friend. Patient seen, chart reviewed for the face-to-face psychiatric consultation evaluation of opioid withdrawal symptoms and depression and anxiety. Patient stated that he has been abusing drugs since he was 25 years old including marijuana, cocaine, and heroin. Patient reported he has been placed and paroll for 9 months since he was released from the incarceration after one year for breaking and entering also larceny charges. Patient reportedly working in [employer] and staying with the 3 roommates in [place] [employer]. Patient is also has a group of friends who has been shooting heroin to each other. Patient reportedly had a unintentional overdose. Patient complaining of nausea, generalized body pains, tightness, restlessness, anxiety and mild sweating. Patient denied diarrhea at this time  but he had in the past. Patient reportedly was treated at community care in [place] about 3 months ago and received gabapentin, hydroxyzine and Cymbalta which she would like to reintroduce because he ran out of the medication and could not follow up with scheduled secondary to transportation problem. Patient has a history of being at [facility] and [facility] in the past and reportedly been sober about 60 days each time.   Medical history: Patient is a 25 y.o. male with medical history significant of HTN and polysubstance abuse presenting with rib pain and SOB.  Patient reports that he cracked his rib the other day and his breathing got really bad so he came to the ER.  +SOB, pain.  Upon further questioning, he reports that he overdosed on heroin on 10/13 and a bystander performed CPR.  He was offered hospital transport and he refused.  No further use since then.  Has had pain since the heroin wore off later that night and it is ongoing.  SOB worsened today, wheezing.  He believes that he has broken ribs which are contributing to the pain. Prior h/o heroin abuse, recently relapsed.  Denies use of other drugs.  Past Psychiatric History: Depression, PTSD and polysubstance dependence and was admitted to [facility] in 2014 for detox of benzo's and opioid and has history of substance rehab at [facility] and half way houses.  Risk to Self: Is patient at risk for suicide?: No Risk to Others:   Prior Inpatient Therapy:   Prior Outpatient Therapy:    Past Medical History:  Past Medical History:  Diagnosis Date  . Hypertension   . Substance abuse     Past Surgical History:  Procedure Laterality  Date  . APPENDECTOMY     Family History:  Family History  Problem Relation Age of Onset  . Diabetes Mother   . Hypertension Mother   . CAD Father 58   Family Psychiatric  History: Patient has significant family support polysubstance abuse deceased father, mother and biological brother. Social History:   History  Alcohol Use  . Yes    Comment: etoh abuse - reports unsure when he last used, "I just don't like it"     History  Drug Use  . Types: Cocaine, Heroin, Benzodiazepines    Comment: reports last cocaine use was months ago, last heroin use on 10/13    Social History   Social History  . Marital status: Single    Spouse name: N/A  . Number of children: N/A  . Years of education: N/A   Occupational History  . remodels apartments    Social History Main Topics  . Smoking status: Current Every Day Smoker    Packs/day: 1.00    Years: 12.00    Types: Cigarettes  . Smokeless tobacco: Never Used  . Alcohol use Yes     Comment: etoh abuse - reports unsure when he last used, "I just don't like it"  . Drug use:     Types: Cocaine, Heroin, Benzodiazepines     Comment: reports last cocaine use was months ago, last heroin use on 10/13  . Sexual activity: Yes    Birth control/ protection: Condom   Other Topics Concern  . None   Social History Narrative  . None   Additional Social History:    Allergies:  No Known Allergies  Labs:  Results for orders placed or performed during the hospital encounter of 08/21/16 (from the past 48 hour(s))  Basic metabolic panel     Status: Abnormal   Collection Time: 08/21/16  8:23 PM  Result Value Ref Range   Sodium 132 (L) 135 - 145 mmol/L   Potassium 3.2 (L) 3.5 - 5.1 mmol/L   Chloride 94 (L) 101 - 111 mmol/L   CO2 27 22 - 32 mmol/L   Glucose, Bld 120 (H) 65 - 99 mg/dL   BUN 9 6 - 20 mg/dL   Creatinine, Ser 1.03 0.61 - 1.24 mg/dL   Calcium 8.8 (L) 8.9 - 10.3 mg/dL   GFR calc non Af Amer >60 >60 mL/min   GFR calc Af Amer >60 >60 mL/min    Comment: (NOTE) The eGFR has been calculated using the CKD EPI equation. This calculation has not been validated in all clinical situations. eGFR's persistently <60 mL/min signify possible Chronic Kidney Disease.    Anion gap 11 5 - 15  CBC     Status: Abnormal   Collection Time: 08/21/16   8:23 PM  Result Value Ref Range   WBC 18.4 (H) 4.0 - 10.5 K/uL   RBC 4.84 4.22 - 5.81 MIL/uL   Hemoglobin 14.5 13.0 - 17.0 g/dL   HCT 42.4 39.0 - 52.0 %   MCV 87.6 78.0 - 100.0 fL   MCH 30.0 26.0 - 34.0 pg   MCHC 34.2 30.0 - 36.0 g/dL   RDW 12.4 11.5 - 15.5 %   Platelets 334 150 - 400 K/uL  Brain natriuretic peptide     Status: None   Collection Time: 08/21/16  8:23 PM  Result Value Ref Range   B Natriuretic Peptide 81.9 0.0 - 100.0 pg/mL  I-stat troponin, ED     Status: None   Collection Time:  08/21/16  8:25 PM  Result Value Ref Range   Troponin i, poc 0.01 0.00 - 0.08 ng/mL   Comment 3            Comment: Due to the release kinetics of cTnI, a negative result within the first hours of the onset of symptoms does not rule out myocardial infarction with certainty. If myocardial infarction is still suspected, repeat the test at appropriate intervals.   I-Stat CG4 Lactic Acid, ED  (not at  [facility])     Status: Abnormal   Collection Time: 08/21/16 10:50 PM  Result Value Ref Range   Lactic Acid, Venous 2.39 (HH) 0.5 - 1.9 mmol/L   Comment NOTIFIED PHYSICIAN   I-Stat CG4 Lactic Acid, ED  (not at  [facility])     Status: None   Collection Time: 08/22/16 12:38 AM  Result Value Ref Range   Lactic Acid, Venous 1.90 0.5 - 1.9 mmol/L  Troponin I (q 6hr x 3)     Status: Abnormal   Collection Time: 08/22/16  2:18 AM  Result Value Ref Range   Troponin I 0.06 (HH) <0.03 ng/mL    Comment: CRITICAL RESULT CALLED TO, READ BACK BY AND VERIFIED WITH: VILLANUEVA C,RN 08/22/16 0319 WAYK   Urinalysis, Routine w reflex microscopic (not at [facility])     Status: Abnormal   Collection Time: 08/22/16  2:40 AM  Result Value Ref Range   Color, Urine AMBER (A) YELLOW    Comment: BIOCHEMICALS MAY BE AFFECTED BY COLOR   APPearance CLEAR CLEAR   Specific Gravity, Urine 1.035 (H) 1.005 - 1.030   pH 6.0 5.0 - 8.0   Glucose, UA NEGATIVE NEGATIVE mg/dL   Hgb urine dipstick NEGATIVE NEGATIVE   Bilirubin Urine  NEGATIVE NEGATIVE   Ketones, ur NEGATIVE NEGATIVE mg/dL   Protein, ur NEGATIVE NEGATIVE mg/dL   Nitrite NEGATIVE NEGATIVE   Leukocytes, UA NEGATIVE NEGATIVE    Comment: MICROSCOPIC NOT DONE ON URINES WITH NEGATIVE PROTEIN, BLOOD, LEUKOCYTES, NITRITE, OR GLUCOSE <1000 mg/dL.  Urine rapid drug screen (hosp performed)     Status: Abnormal   Collection Time: 08/22/16  2:40 AM  Result Value Ref Range   Opiates POSITIVE (A) NONE DETECTED   Cocaine NONE DETECTED NONE DETECTED   Benzodiazepines NONE DETECTED NONE DETECTED   Amphetamines NONE DETECTED NONE DETECTED   Tetrahydrocannabinol NONE DETECTED NONE DETECTED   Barbiturates NONE DETECTED NONE DETECTED    Comment:        DRUG SCREEN FOR MEDICAL PURPOSES ONLY.  IF CONFIRMATION IS NEEDED FOR ANY PURPOSE, NOTIFY LAB WITHIN 5 DAYS.        LOWEST DETECTABLE LIMITS FOR URINE DRUG SCREEN Drug Class       Cutoff (ng/mL) Amphetamine      1000 Barbiturate      200 Benzodiazepine   211 Tricyclics       941 Opiates          300 Cocaine          300 THC              50   Strep pneumoniae urinary antigen     Status: None   Collection Time: 08/22/16  2:40 AM  Result Value Ref Range   Strep Pneumo Urinary Antigen NEGATIVE NEGATIVE    Comment:        Infection due to S. pneumoniae cannot be absolutely ruled out since the antigen present may be below the detection limit of the test.  Troponin I (q 6hr x 3)     Status: None   Collection Time: 08/22/16  8:27 AM  Result Value Ref Range   Troponin I <0.03 <0.03 ng/mL  Basic metabolic panel     Status: Abnormal   Collection Time: 08/22/16  8:27 AM  Result Value Ref Range   Sodium 133 (L) 135 - 145 mmol/L   Potassium 3.3 (L) 3.5 - 5.1 mmol/L   Chloride 103 101 - 111 mmol/L   CO2 21 (L) 22 - 32 mmol/L   Glucose, Bld 139 (H) 65 - 99 mg/dL   BUN 7 6 - 20 mg/dL   Creatinine, Ser 0.91 0.61 - 1.24 mg/dL   Calcium 7.7 (L) 8.9 - 10.3 mg/dL   GFR calc non Af Amer >60 >60 mL/min   GFR calc Af  Amer >60 >60 mL/min    Comment: (NOTE) The eGFR has been calculated using the CKD EPI equation. This calculation has not been validated in all clinical situations. eGFR's persistently <60 mL/min signify possible Chronic Kidney Disease.    Anion gap 9 5 - 15  CBC     Status: Abnormal   Collection Time: 08/22/16  8:27 AM  Result Value Ref Range   WBC 16.1 (H) 4.0 - 10.5 K/uL   RBC 4.29 4.22 - 5.81 MIL/uL   Hemoglobin 12.5 (L) 13.0 - 17.0 g/dL   HCT 37.6 (L) 39.0 - 52.0 %   MCV 87.6 78.0 - 100.0 fL   MCH 29.1 26.0 - 34.0 pg   MCHC 33.2 30.0 - 36.0 g/dL   RDW 12.5 11.5 - 15.5 %   Platelets 278 150 - 400 K/uL    Current Facility-Administered Medications  Medication Dose Route Frequency Provider Last Rate Last Dose  . 0.9 % NaCl with KCl 40 mEq / L  infusion   Intravenous Continuous Karmen Bongo, MD 100 mL/hr at 08/22/16 0313 100 mL/hr at 08/22/16 0313  . acetaminophen (TYLENOL) tablet 650 mg  650 mg Oral Q6H PRN Karmen Bongo, MD       Or  . acetaminophen (TYLENOL) suppository 650 mg  650 mg Rectal Q6H PRN Karmen Bongo, MD      . albuterol (PROVENTIL,VENTOLIN) solution continuous neb  10 mg/hr Nebulization Continuous Roxanna Mew, PA-C   Stopped at 08/21/16 2212  . azithromycin (ZITHROMAX) tablet 500 mg  500 mg Oral Q24H Karmen Bongo, MD      . cefTRIAXone (ROCEPHIN) 1 g in dextrose 5 % 50 mL IVPB  1 g Intravenous Q24H Karmen Bongo, MD      . enoxaparin (LOVENOX) injection 60 mg  60 mg Subcutaneous Q24H Karmen Bongo, MD   60 mg at 08/22/16 1003  . ketorolac (TORADOL) 30 MG/ML injection 30 mg  30 mg Intravenous Q6H PRN Karmen Bongo, MD      . methocarbamol (ROBAXIN) 500 mg in dextrose 5 % 50 mL IVPB  500 mg Intravenous Q6H PRN Karmen Bongo, MD      . ondansetron [facility]) tablet 4 mg  4 mg Oral Q6H PRN Karmen Bongo, MD       Or  . ondansetron [facility]) injection 4 mg  4 mg Intravenous Q6H PRN Karmen Bongo, MD      . sodium chloride flush (NS) 0.9 % injection 3  mL  3 mL Intravenous Q12H Karmen Bongo, MD   3 mL at 08/22/16 1009  . traMADol (ULTRAM) tablet 50 mg  50 mg Oral Q6H PRN Karmen Bongo, MD   50 mg  at 08/22/16 1007    Musculoskeletal: Strength & Muscle Tone: decreased Gait & Station: unable to stand Patient leans: N/A  Psychiatric Specialty Exam: Physical Exam as per history and physical   ROS complaining about nausea, generalized body aches, sweating and generalized weakness and fatigue. Patient denied shortness of breath and chest pain during my evaluation. No Fever-chills, No Headache, No changes with Vision or hearing, reports vertigo No problems swallowing food or Liquids, No Chest pain, Cough or Shortness of Breath, No Abdominal pain, No Nausea or Vommitting, Bowel movements are regular, No Blood in stool or Urine, No dysuria, No new skin rashes or bruises, No new joints pains-aches,  No new weakness, tingling, numbness in any extremity, No recent weight gain or loss, No polyuria, polydypsia or polyphagia,  A full 10 point Review of Systems was done, except as stated above, all other Review of Systems were negative.   Blood pressure 127/64, pulse 99, temperature 99.3 F (37.4 C), temperature source Oral, resp. rate 20, height 5' 10"  (1.778 m), weight 113.6 kg (250 lb 8 oz), SpO2 94 %.Body mass index is 35.94 kg/m.  General Appearance: Disheveled  Eye Contact:  Good  Speech:  Clear and Coherent and Slow  Volume:  Decreased  Mood:  Anxious  Affect:  Appropriate and Congruent  Thought Process:  Coherent and Goal Directed  Orientation:  Full (Time, Place, and Person)  Thought Content:  WDL and Logical  Suicidal Thoughts:  No  Homicidal Thoughts:  No  Memory:  Immediate;   Good Recent;   Fair Remote;   Fair  Judgement:  Impaired  Insight:  Fair  Psychomotor Activity:  Psychomotor Retardation and Restlessness  Concentration:  Concentration: Fair and Attention Span: Fair  Recall:  Good  Fund of Knowledge:  Good   Language:  Good  Akathisia:  Negative  Handed:  Right  AIMS (if indicated):     Assets:  Communication Skills Desire for Improvement Housing Leisure Time Resilience Social Support Talents/Skills  ADL's:  Impaired  Cognition:  WNL  Sleep:        Treatment Plan Summary: 25 years old male with a history of polysubstance abuse and recently relapsed on heroin after 60 days of relapse followed by rehabilitation at community care in [place]. Patient has been suffering with withdrawal symptoms as listed above. Patient is willing to participate in detox treatment and also rehabilitation treatment if possible after medically cleared. Patient is willing to restart his home medication which he has been stopped due to ran away from medication on and not able to follow up with the scheduled appointment.  Daily contact with patient to assess and evaluate symptoms and progress in treatment and Medication management  Safety concerns: Patient has no safety concerns denied suicidal and homicidal ideation Opioid withdrawal symptoms: We start clonidine protocol Depression: We will start Cymbalta 30 mg daily starting tomorrow Anxiety: We will start Neurontin 300 mg 3 times daily and hydroxyzine 25 mg 3 times daily as needed for anxiety Appreciate psychiatric consultation and follow up as clinically required Please contact [phone] or [phone] if needs further assistance  Disposition: Refer to the unit social service regarding substance abuse rehabilitation facility when medically cleared  No evidence of imminent risk to self or others at present.   Patient does not meet criteria for psychiatric inpatient admission. Supportive therapy provided about ongoing stressors. Refer to IOP.Or residential substance abuse rehabilitation treatment  Ambrose Finland, MD 08/22/2016 12:22 PM

## 2016-08-22 NOTE — Progress Notes (Addendum)
PROGRESS NOTE [facility]   Bryan Jennings   GNF:[id number] DOB: 1991/01/29  DOA: 08/21/2016 PCP: No PCP Per Patient   Brief Narrative:  Bryan Jennings is a 25 y.o. male with medical history significant of HTN and polysubstance abuse presenting with rib pain and SOB. Patient admitted for Sepsis 2/2 CAP with poss opioid withdrawals. Patient was evaluated by psych for detox.   Subjective: Patient seen and examined at bedside. Patient c/o rib pain, still SOB when on Ra. No acute events overnight.    Assessment & Plan:  Sepsis from probable CAP - Initially with elevated WBC count, tachycardia, tachypnea with elevated lactate to 2.39 and borderline hypotension. -While sepsis is clearly of concern, opiate withdrawal is also a reasonable explanation for symptoms -Possible CAP as source - has been splinting due to rib pain (no apparent fractures from CTPA or CXR despite reported CPR at time of overdose) -Will continue current antibiotics  -Blood and urine cultures pending -Continue IVF  -strep pneumo negative  -Slight increase in TNI, likely due to demand ischemia due to sepsis.   Polysubstance abuse with opiate withdrawal - Patient with reported heroin OD and reported bystander CPR -Suspect that this is a large component of the current situation -Monitor for opioid withdrawal  -Symptomatic care for now  -Psych evaluation appreciated   HTN - stable  - Will continue to hold hypertensive meds  Hypokalemia - Repleate - Repeat BMP  DVT prophylaxis: Lovenox Code Status: Full Family Communication: None at bedside Disposition Plan: Anticipate discharge back to home environment when medically stable   Consultants:   None   Procedures:   None   Antimicrobials:  Azithromycin 10/17 ->  Rocephin 10/17 ->    Objective: Vitals:   08/22/16 0030 08/22/16 0045 08/22/16 0210 08/22/16 1138  BP: 108/67 109/63 133/69 127/64  Pulse: (!) 122 115 (!) 117 99  Resp: 25 (!) 29 (!)  32 20  Temp:   99.3 F (37.4 C) 99.3 F (37.4 C)  TempSrc:   Oral Oral  SpO2: 95% 96% 96% 94%  Weight:   113.6 kg (250 lb 8 oz)   Height:   5\' 10"  (1.778 m)     Intake/Output Summary (Last 24 hours) at 08/22/16 1547 Last data filed at 08/22/16 1500  Gross per 24 hour  Intake          1869.67 ml  Output              500 ml  Net          1369.67 ml   Filed Weights   08/21/16 1851 08/22/16 0210  Weight: 117.9 kg (260 lb) 113.6 kg (250 lb 8 oz)    Examination:  General exam: Appears calm and comfortable  HEENT: AC/AT, PERRLA, OP moist and clear Respiratory system: Mild rales at the left lower base. No wheezes,crackle or rhonchi Cardiovascular system: S1 & S2 heard, RRR. No JVD, murmurs, rubs or gallops Gastrointestinal system: Abdomen is nondistended, soft and nontender. No organomegaly or masses felt.  Central nervous system: Alert and oriented. No focal neurological deficits. Extremities: No pedal edema. Symmetric, strength 5/5   Skin: No rashes, lesions or ulcers Psychiatry: Judgement and insight appear normal. Mood & affect flat.   Data Reviewed: I have personally reviewed following labs and imaging studies  CBC:  Recent Labs Lab 08/21/16 2023 08/22/16 0827  WBC 18.4* 16.1*  HGB 14.5 12.5*  HCT 42.4 37.6*  MCV 87.6 87.6  PLT 334 278   Basic  Metabolic Panel:  Recent Labs Lab 08/21/16 2023 08/22/16 0827  NA 132* 133*  K 3.2* 3.3*  CL 94* 103  CO2 27 21*  GLUCOSE 120* 139*  BUN 9 7  CREATININE 1.03 0.91  CALCIUM 8.8* 7.7*   GFR: Estimated Creatinine Clearance: 156.6 mL/min (by C-G formula based on SCr of 0.91 mg/dL). Liver Function Tests: No results for input(s): AST, ALT, ALKPHOS, BILITOT, PROT, ALBUMIN in the last 168 hours. No results for input(s): LIPASE, AMYLASE in the last 168 hours. No results for input(s): AMMONIA in the last 168 hours. Coagulation Profile: No results for input(s): INR, PROTIME in the last 168 hours. Cardiac  Enzymes:  Recent Labs Lab 08/22/16 0218 08/22/16 0827  TROPONINI 0.06* <0.03   BNP (last 3 results) No results for input(s): PROBNP in the last 8760 hours. HbA1C: No results for input(s): HGBA1C in the last 72 hours. CBG: No results for input(s): GLUCAP in the last 168 hours. Lipid Profile: No results for input(s): CHOL, HDL, LDLCALC, TRIG, CHOLHDL, LDLDIRECT in the last 72 hours. Thyroid Function Tests: No results for input(s): TSH, T4TOTAL, FREET4, T3FREE, THYROIDAB in the last 72 hours. Anemia Panel: No results for input(s): VITAMINB12, FOLATE, FERRITIN, TIBC, IRON, RETICCTPCT in the last 72 hours. Sepsis Labs:  Recent Labs Lab 08/21/16 2250 08/22/16 0038  LATICACIDVEN 2.39* 1.90    Recent Results (from the past 240 hour(s))  Blood Culture (routine x 2)     Status: None (Preliminary result)   Collection Time: 08/21/16 10:35 PM  Result Value Ref Range Status   Specimen Description BLOOD LEFT ANTECUBITAL  Final   Special Requests BOTTLES DRAWN AEROBIC AND ANAEROBIC 5CC  Final   Culture NO GROWTH < 24 HOURS  Final   Report Status PENDING  Incomplete  Blood Culture (routine x 2)     Status: None (Preliminary result)   Collection Time: 08/21/16 10:40 PM  Result Value Ref Range Status   Specimen Description BLOOD LEFT HAND  Final   Special Requests BOTTLES DRAWN AEROBIC AND ANAEROBIC 5CC  Final   Culture NO GROWTH < 24 HOURS  Final   Report Status PENDING  Incomplete      Radiology Studies: Ct Angio Chest Pe W/cm &/or Wo Cm  Result Date: 08/21/2016 CLINICAL DATA:  25 year old male with tachycardia and tachypnea. EXAM: CT ANGIOGRAPHY CHEST WITH CONTRAST TECHNIQUE: Multidetector CT imaging of the chest was performed using the standard protocol during bolus administration of intravenous contrast. Multiplanar CT image reconstructions and MIPs were obtained to evaluate the vascular anatomy. CONTRAST:  80 cc Isovue 370 COMPARISON:  Chest radiograph dated 08/21/2016 FINDINGS:  Evaluation of this exam is limited due to respiratory motion artifact. Cardiovascular: The thoracic aorta appears unremarkable. No dissection or aneurysm. The origins of the great vessels of the aortic arch appear patent. Evaluation of the pulmonary arteries is limited due to respiratory motion artifact. No definite central pulmonary artery embolus identified. Top-normal cardiac size. No pericardial effusion. Mediastinum/Nodes: There is no hilar or mediastinal adenopathy. The esophagus is grossly unremarkable. No thyroid nodules identified. Lungs/Pleura: There is a small right pleural effusion. Patchy area of consolidative change involving the right lung base in the right middle and right lower lobe may represent atelectasis versus pneumonia. Left lung base linear and hazy densities may be atelectatic changes or infiltrate. There is no pleural effusion on the left. No pneumothorax. The central airways are patent. Upper Abdomen: No acute abnormality. Musculoskeletal: No chest wall abnormality. No acute or significant osseous findings. Review  of the MIP images confirms the above findings. IMPRESSION: Limited evaluation for pulmonary embolism due to respiratory motion artifact. No definite central pulmonary artery embolus identified. Small right pleural effusion with right lung base atelectasis versus pneumonia. Small patchy left lung base density may represent atelectatic changes versus infiltrate. Clinical correlation and follow-up to resolution recommended. Electronically Signed   By: Elgie Collard M.D.   On: 08/21/2016 22:37   Dg Chest Portable 1 View  Result Date: 08/21/2016 CLINICAL DATA:  Dyspnea and chest pain for 2 days. Diagnosed with right rib fracture 08/18/2016. EXAM: PORTABLE CHEST 1 VIEW COMPARISON:  08/18/2016. FINDINGS: There is new platelike atelectasis at the right lung base. There is no focal pulmonary consolidation pneumothorax. Heart and mediastinal contours are unremarkable. Low lung  volumes on current exam. No acute displaced appearing fracture is identified. With IMPRESSION: Right lower lobe atelectasis. Electronically Signed   By: Tollie Eth M.D.   On: 08/21/2016 19:35      Scheduled Meds: . azithromycin  500 mg Oral Q24H  . cefTRIAXone (ROCEPHIN)  IV  1 g Intravenous Q24H  . cloNIDine  0.1 mg Oral QID   Followed by  . [START ON 08/24/2016] cloNIDine  0.1 mg Oral BH-qamhs   Followed by  . [START ON 08/27/2016] cloNIDine  0.1 mg Oral QAC breakfast  . [START ON 08/23/2016] DULoxetine  30 mg Oral Daily  . enoxaparin (LOVENOX) injection  60 mg Subcutaneous Q24H  . gabapentin  300 mg Oral TID  . sodium chloride flush  3 mL Intravenous Q12H   Continuous Infusions: . 0.9 % NaCl with KCl 40 mEq / L 100 mL/hr (08/22/16 1415)  . albuterol Stopped (08/21/16 2212)     LOS: 1 day    Latrelle Dodrill, MD [facility] Pager [phone]  If 7PM-7AM, please contact night-coverage [email or web address] Password [email or web address] 08/22/2016, 3:47 PM

## 2016-08-22 NOTE — Progress Notes (Addendum)
Nutrition Brief Note  Patient identified on the Malnutrition Screening Tool (MST) Report  Wt Readings from Last 15 Encounters:  08/22/16 250 lb 8 oz (113.6 kg)  11/04/13 239 lb (108.4 kg)  11/03/13 245 lb (111.1 kg)   Bryan Jennings is a 25 y.o. male with medical history significant of HTN and polysubstance abuse presenting with rib pain and SOB.  Patient reports that he cracked his rib the other day and his breathing got really bad so he came to the ER.  +SOB, pain.  Upon further questioning, he reports that he overdosed on heroin on 10/13 and a bystander performed CPR.  He was offered hospital transport and he refused.  No further use since then.  Has had pain since the heroin wore off later that night and it is ongoing.  SOB worsened today, wheezing.  He believes that he has broken ribs which are contributing to the pain.  Pt in with MD at time of visit.   Wt hx reviewed; no wt changes noted within > 1 year.   No signs of fat or muscle depletion observed.  Body mass index is 35.94 kg/m. Patient meets criteria for obesity, class II based on current BMI.   Current diet order is regular, patient is consuming approximately n/a% of meals at this time. Labs and medications reviewed.   No nutrition interventions warranted at this time. If nutrition issues arise, please consult RD.   Jajaira Ruis A. Mayford KnifeWilliams, RD, LDN, CDE Pager: [phone] After hours Pager: [phone]

## 2016-08-22 NOTE — Progress Notes (Signed)
Patient Troponin level 0.06. MD notified.

## 2016-08-22 NOTE — Progress Notes (Signed)
R/t order for documentation on Clinical Opiate Withdrawal Scale, I have spoken with Dr. Elsie SaasJonnalagadda notifying him that [facility] cannot document on this order.  I also spoke to IT, and apparently, IT will have to create a ticket to change the software for this unit in order for nursing staff to document on th eCOWS for this pt.   Additionally, I did speak to Orlean PattenLindsay Strader at [facility],  And she was able to access pt chart and wrench in the COWS scale.  She documented for me, asking me specific questions r/t pt.  She was able to document a COWS score of 5.   However, at this time, this nurse is still unable to [facility] to wrench in COWS, as it is not available.   I have called Dr. Shela Commons"J," and left a VM on his phone and he is aware.

## 2016-08-22 NOTE — Progress Notes (Signed)
Patient arrived in the unit accompanied by NT via stretcher. Orientation to the unit given. Patient verbalizes understanding. 

## 2016-08-23 LAB — BASIC METABOLIC PANEL
Anion gap: 6 (ref 5–15)
BUN: 12 mg/dL (ref 6–20)
CALCIUM: 8.1 mg/dL — AB (ref 8.9–10.3)
CO2: 27 mmol/L (ref 22–32)
CREATININE: 0.84 mg/dL (ref 0.61–1.24)
Chloride: 103 mmol/L (ref 101–111)
GFR calc non Af Amer: >60 mL/min (ref >60)
Glucose, Bld: 114 mg/dL — ABNORMAL HIGH (ref 65–99)
Potassium: 4.1 mmol/L (ref 3.5–5.1)
SODIUM: 136 mmol/L (ref 135–145)

## 2016-08-23 LAB — URINE CULTURE: CULTURE: NO GROWTH

## 2016-08-23 MED ORDER — METHYLPREDNISOLONE SODIUM SUCC 125 MG IJ SOLR
60.0000 mg | Freq: Every day | INTRAMUSCULAR | Status: AC
  Administered 2016-08-23: 60 mg via INTRAVENOUS
  Filled 2016-08-23: qty 2

## 2016-08-23 NOTE — Progress Notes (Addendum)
PROGRESS NOTE [facility]   Bryan Jennings [place]   BJY:[id number] DOB: 03/25/1991  DOA: 08/21/2016 PCP: No PCP Per Patient   Brief Narrative:  Bryan Jennings Bryan Jennings is a 25 y.o. male with medical history significant of HTN and polysubstance abuse presenting with rib pain and SOB. Patient admitted for Sepsis 2/2 CAP with poss opioid withdrawals. Patient was evaluated by psych for detox whom recommended outpatient therapy  Subjective: Patient seen and examined at bedside. Patient c/o rib pain, medications help, shortness of breath has improved. No acute events overnight.   Assessment & Plan:  Sepsis from probable CAP - Initially with elevated WBC count, tachycardia, tachypnea with elevated lactate to 2.39, now has normalized. -While sepsis is clearly of concern, opiate withdrawal is also a reasonable explanation for symptoms Possible CAP as source - no apparent fractures from CTPA or CXR despite reported CPR at time of overdose) Possible explanation could be costochondritis related to CPR reported -Will continue current antibiotics - -Blood and urine cultures pending - no growth up-to-date -Patient with good oral intake can DC IV fluids -strep pneumo negative  -Slight increase in TNI, likely due to demand ischemia due to sepsis. Normalized -Continue pain medication as needed -1 dose of Solumedrol given -Wean O2 supplements, keep sat above 91%  Polysubstance abuse with opiate withdrawal - Patient with reported heroin OD and reported bystander CPR -Suspect that this is a large component of the current situation -Monitor for opioid withdrawal  -Symptomatic care for now  -Psych evaluation appreciated   HTN - stable  - Will continue to hold hypertensive meds  Hypokalemia - Resolved  - Repeat BMP in am  DVT prophylaxis: Lovenox Code Status: Full Family Communication: None at bedside Disposition Plan: Anticipate discharge back to home environment when medically stable, possible  tomorrow   Consultants:   None   Procedures:   None   Antimicrobials:  Azithromycin 10/17 ->  Rocephin 10/17 ->    Objective: Vitals:   08/22/16 1931 08/23/16 0018 08/23/16 0434 08/23/16 0800  BP: (!) 110/53 (!) 100/43 (!) 112/53 108/60  Pulse: 71 72 89 69  Resp: 20 18 20 19   Temp: 98.4 F (36.9 C) 97.3 F (36.3 C) 98.4 F (36.9 C) 98.5 F (36.9 C)  TempSrc: Oral Oral Oral Oral  SpO2: 99% 99% 96% 97%  Weight:   115.7 kg (255 lb)   Height:        Intake/Output Summary (Last 24 hours) at 08/23/16 1021 Last data filed at 08/23/16 0935  Gross per 24 hour  Intake          2963.66 ml  Output             1675 ml  Net          1288.66 ml   Filed Weights   08/21/16 1851 08/22/16 0210 08/23/16 0434  Weight: 117.9 kg (260 lb) 113.6 kg (250 lb 8 oz) 115.7 kg (255 lb)    Examination:  General exam: Appears calm and comfortable  Respiratory system: Diffuse late expiratory wheeze, tender to palpation on the R side.Good Air entry No crackle or rhonchi Cardiovascular system: S1 & S2 heard, RRR. No JVD, murmurs, rubs or gallops Gastrointestinal system: Abdomen is nondistended, soft and nontender. No organomegaly or masses felt.  Central nervous system: Alert and oriented. No focal neurological deficits. Extremities: No pedal edema. Symmetric, strength 5/5   Skin: No rashes, lesions or ulcers Psychiatry: Judgement and insight appear normal. Mood & affect flat.   Data Reviewed:  I have personally reviewed following labs and imaging studies  CBC:  Recent Labs Lab 08/21/16 2023 08/22/16 0827  WBC 18.4* 16.1*  HGB 14.5 12.5*  HCT 42.4 37.6*  MCV 87.6 87.6  PLT 334 278   Basic Metabolic Panel:  Recent Labs Lab 08/21/16 2023 08/22/16 0827  NA 132* 133*  K 3.2* 3.3*  CL 94* 103  CO2 27 21*  GLUCOSE 120* 139*  BUN 9 7  CREATININE 1.03 0.91  CALCIUM 8.8* 7.7*   GFR: Estimated Creatinine Clearance: 158.1 mL/min (by C-G formula based on SCr of 0.91  mg/dL). Liver Function Tests: No results for input(s): AST, ALT, ALKPHOS, BILITOT, PROT, ALBUMIN in the last 168 hours. No results for input(s): LIPASE, AMYLASE in the last 168 hours. No results for input(s): AMMONIA in the last 168 hours. Coagulation Profile: No results for input(s): INR, PROTIME in the last 168 hours. Cardiac Enzymes:  Recent Labs Lab 08/22/16 0218 08/22/16 0827 08/22/16 1527  TROPONINI 0.06* <0.03 <0.03   BNP (last 3 results) No results for input(s): PROBNP in the last 8760 hours. HbA1C: No results for input(s): HGBA1C in the last 72 hours. CBG: No results for input(s): GLUCAP in the last 168 hours. Lipid Profile: No results for input(s): CHOL, HDL, LDLCALC, TRIG, CHOLHDL, LDLDIRECT in the last 72 hours. Thyroid Function Tests: No results for input(s): TSH, T4TOTAL, FREET4, T3FREE, THYROIDAB in the last 72 hours. Anemia Panel: No results for input(s): VITAMINB12, FOLATE, FERRITIN, TIBC, IRON, RETICCTPCT in the last 72 hours. Sepsis Labs:  Recent Labs Lab 08/21/16 2250 08/22/16 0038  LATICACIDVEN 2.39* 1.90    Recent Results (from the past 240 hour(s))  Blood Culture (routine x 2)     Status: None (Preliminary result)   Collection Time: 08/21/16 10:35 PM  Result Value Ref Range Status   Specimen Description BLOOD LEFT ANTECUBITAL  Final   Special Requests BOTTLES DRAWN AEROBIC AND ANAEROBIC 5CC  Final   Culture NO GROWTH < 24 HOURS  Final   Report Status PENDING  Incomplete  Blood Culture (routine x 2)     Status: None (Preliminary result)   Collection Time: 08/21/16 10:40 PM  Result Value Ref Range Status   Specimen Description BLOOD LEFT HAND  Final   Special Requests BOTTLES DRAWN AEROBIC AND ANAEROBIC 5CC  Final   Culture NO GROWTH < 24 HOURS  Final   Report Status PENDING  Incomplete  Urine culture     Status: None   Collection Time: 08/22/16  2:40 AM  Result Value Ref Range Status   Specimen Description URINE, RANDOM  Final   Special  Requests NONE  Final   Culture NO GROWTH 1 DAY  Final   Report Status 08/23/2016 FINAL  Final      Radiology Studies: Ct Angio Chest Pe W/cm &/or Wo Cm  Result Date: 08/21/2016 CLINICAL DATA:  25 year old male with tachycardia and tachypnea. EXAM: CT ANGIOGRAPHY CHEST WITH CONTRAST TECHNIQUE: Multidetector CT imaging of the chest was performed using the standard protocol during bolus administration of intravenous contrast. Multiplanar CT image reconstructions and MIPs were obtained to evaluate the vascular anatomy. CONTRAST:  80 cc Isovue 370 COMPARISON:  Chest radiograph dated 08/21/2016 FINDINGS: Evaluation of this exam is limited due to respiratory motion artifact. Cardiovascular: The thoracic aorta appears unremarkable. No dissection or aneurysm. The origins of the great vessels of the aortic arch appear patent. Evaluation of the pulmonary arteries is limited due to respiratory motion artifact. No definite central pulmonary artery embolus  identified. Top-normal cardiac size. No pericardial effusion. Mediastinum/Nodes: There is no hilar or mediastinal adenopathy. The esophagus is grossly unremarkable. No thyroid nodules identified. Lungs/Pleura: There is a small right pleural effusion. Patchy area of consolidative change involving the right lung base in the right middle and right lower lobe may represent atelectasis versus pneumonia. Left lung base linear and hazy densities may be atelectatic changes or infiltrate. There is no pleural effusion on the left. No pneumothorax. The central airways are patent. Upper Abdomen: No acute abnormality. Musculoskeletal: No chest wall abnormality. No acute or significant osseous findings. Review of the MIP images confirms the above findings. IMPRESSION: Limited evaluation for pulmonary embolism due to respiratory motion artifact. No definite central pulmonary artery embolus identified. Small right pleural effusion with right lung base atelectasis versus pneumonia.  Small patchy left lung base density may represent atelectatic changes versus infiltrate. Clinical correlation and follow-up to resolution recommended. Electronically Signed   By: Elgie Collard M.D.   On: 08/21/2016 22:37   Dg Chest Portable 1 View  Result Date: 08/21/2016 CLINICAL DATA:  Dyspnea and chest pain for 2 days. Diagnosed with right rib fracture 08/18/2016. EXAM: PORTABLE CHEST 1 VIEW COMPARISON:  08/18/2016. FINDINGS: There is new platelike atelectasis at the right lung base. There is no focal pulmonary consolidation pneumothorax. Heart and mediastinal contours are unremarkable. Low lung volumes on current exam. No acute displaced appearing fracture is identified. With IMPRESSION: Right lower lobe atelectasis. Electronically Signed   By: Tollie Eth M.D.   On: 08/21/2016 19:35      Scheduled Meds: . azithromycin  500 mg Oral Q24H  . cefTRIAXone (ROCEPHIN)  IV  1 g Intravenous Q24H  . cloNIDine  0.1 mg Oral QID   Followed by  . [START ON 08/24/2016] cloNIDine  0.1 mg Oral BH-qamhs   Followed by  . [START ON 08/27/2016] cloNIDine  0.1 mg Oral QAC breakfast  . DULoxetine  30 mg Oral Daily  . enoxaparin (LOVENOX) injection  60 mg Subcutaneous Q24H  . gabapentin  300 mg Oral TID  . methylPREDNISolone (SOLU-MEDROL) injection  60 mg Intravenous Daily  . sodium chloride flush  3 mL Intravenous Q12H   Continuous Infusions: . 0.9 % NaCl with KCl 40 mEq / L 100 mL/hr (08/22/16 2342)  . albuterol Stopped (08/21/16 2212)     LOS: 2 days    Latrelle Dodrill, MD [facility] Pager [phone]  If 7PM-7AM, please contact night-coverage [email or web address] Password [email or web address] 08/23/2016, 10:21 AM

## 2016-08-23 NOTE — Progress Notes (Signed)
Pt refused lab work this morning, MD notified, will call Lab later for the next attempt.

## 2016-08-24 ENCOUNTER — Inpatient Hospital Stay (HOSPITAL_COMMUNITY): Payer: Self-pay

## 2016-08-24 LAB — BASIC METABOLIC PANEL
Anion gap: 7 (ref 5–15)
BUN: 13 mg/dL (ref 6–20)
CALCIUM: 8.7 mg/dL — AB (ref 8.9–10.3)
CO2: 24 mmol/L (ref 22–32)
CREATININE: 0.66 mg/dL (ref 0.61–1.24)
Chloride: 105 mmol/L (ref 101–111)
Glucose, Bld: 149 mg/dL — ABNORMAL HIGH (ref 65–99)
Potassium: 4 mmol/L (ref 3.5–5.1)
SODIUM: 136 mmol/L (ref 135–145)

## 2016-08-24 LAB — CBC
HCT: 33.9 % — ABNORMAL LOW (ref 39.0–52.0)
Hemoglobin: 11.6 g/dL — ABNORMAL LOW (ref 13.0–17.0)
MCH: 29.4 pg (ref 26.0–34.0)
MCHC: 34.2 g/dL (ref 30.0–36.0)
MCV: 86 fL (ref 78.0–100.0)
PLATELETS: 376 10*3/uL (ref 150–400)
RBC: 3.94 MIL/uL — ABNORMAL LOW (ref 4.22–5.81)
RDW: 12 % (ref 11.5–15.5)
WBC: 20.9 10*3/uL — AB (ref 4.0–10.5)

## 2016-08-24 MED ORDER — ALBUTEROL SULFATE (2.5 MG/3ML) 0.083% IN NEBU
2.5000 mg | INHALATION_SOLUTION | RESPIRATORY_TRACT | Status: DC | PRN
  Administered 2016-08-25 – 2016-08-28 (×3): 2.5 mg via RESPIRATORY_TRACT
  Filled 2016-08-24 (×3): qty 3

## 2016-08-24 MED ORDER — PIPERACILLIN-TAZOBACTAM 3.375 G IVPB 30 MIN: 3.3750 g | Freq: Three times a day (TID) | INTRAVENOUS | Status: DC

## 2016-08-24 MED ORDER — PIPERACILLIN-TAZOBACTAM 3.375 G IVPB
3.3750 g | Freq: Three times a day (TID) | INTRAVENOUS | Status: DC
Start: 2016-08-24 — End: 2016-08-26
  Administered 2016-08-24 – 2016-08-26 (×6): 3.375 g via INTRAVENOUS
  Filled 2016-08-24 (×9): qty 50

## 2016-08-24 NOTE — Evaluation (Addendum)
Physical Therapy Evaluation and Discharge Patient Details Name: Bryan Jennings MRN: [id number] DOB: [birth year] Today's Date: 08/24/2016   History of Present Illness  25 y.o. male with medical history significant of HTN and polysubstance abuse presenting with rib pain and SOB. Patient admitted for Sepsis 2/2 CAP with poss opioid withdrawals. Patient was evaluated by psych for detox whom recommended outpatient therapy    Clinical Impression  Patient evaluated by Physical Therapy with no further PT needs identified. All education has been completed and the patient has no further questions. Patient did desaturate on room air, and in fact required up to 3L NCO2 to maintain SaO2 90%. (See O2 qualifying note). PT is signing off. (Nursing can document SaO2 levels when walking. Patient has no PT needs). Thank you for this referral.  NOTE: Feel pt could benefit from incentive spirometer and/or flutter valve. Educated in pursed lip breathing and deep breathing, however pt limited by pain.     Follow Up Recommendations No PT follow up    Equipment Recommendations  None recommended by PT    Recommendations for Other Services       Precautions / Restrictions Precautions Precautions: Other (comment) Precaution Comments: desaturates on room air      Mobility  Bed Mobility Overal bed mobility: Independent                Transfers Overall transfer level: Independent                  Ambulation/Gait Ambulation/Gait assistance: Supervision Ambulation Distance (Feet): 100 Feet Assistive device: None Gait Pattern/deviations: WFL(Within Functional Limits)   Gait velocity interpretation: Below normal speed for age/gender    Stairs            Wheelchair Mobility    Modified Rankin (Stroke Patients Only)       Balance Overall balance assessment: Independent                                           Pertinent Vitals/Pain Pain Assessment:  0-10 Pain Score: 7  Pain Location: Rt ribs Pain Descriptors / Indicators: Sharp Pain Intervention(s): Limited activity within patient's tolerance;Monitored during session;Repositioned;Relaxation    Home Living Family/patient expects to be discharged to:: Private residence Living Arrangements: Non-relatives/Friends                    Prior Function Level of Independence: Independent               Higher education careers adviserHand Dominance        Extremity/Trunk Assessment   Upper Extremity Assessment: Overall WFL for tasks assessed           Lower Extremity Assessment: Overall WFL for tasks assessed      Cervical / Trunk Assessment: Normal  Communication   Communication: No difficulties  Cognition Arousal/Alertness: Awake/alert Behavior During Therapy: WFL for tasks assessed/performed Overall Cognitive Status: Within Functional Limits for tasks assessed                      General Comments      Exercises     Assessment/Plan    PT Assessment Patent does not need any further PT services  PT Problem List            PT Treatment Interventions      PT Goals (Current goals can be found in the  Care Plan section)  Acute Rehab PT Goals Patient Stated Goal: decr pain and pneumonia PT Goal Formulation: All assessment and education complete, DC therapy    Frequency     Barriers to discharge        Co-evaluation               End of Session Equipment Utilized During Treatment: Gait belt;Oxygen Activity Tolerance: Treatment limited secondary to medical complications (Comment) (decr SaO2) Patient left: in chair;with call bell/phone within reach Nurse Communication: Mobility status;Other (comment) (decr SaO2)         Time: 9604-5409 PT Time Calculation (min) (ACUTE ONLY): 19 min   Charges:   PT Evaluation $PT Eval Low Complexity: 1 Procedure     PT G Codes:        Yaret Hush 23-Sep-2016, 11:36 AM

## 2016-08-24 NOTE — Progress Notes (Addendum)
PROGRESS NOTE [facility]   Bryan Jennings   WUJ:[id number] DOB: 1991/01/29  DOA: 08/21/2016 PCP: No PCP Per Patient   Brief Narrative:  Bryan Jennings is a 25 y.o. male with medical history significant of HTN and polysubstance abuse presenting with rib pain and SOB. Patient admitted for Sepsis 2/2 CAP with poss opioid withdrawals. Patient was evaluated by psych for detox whom recommended outpatient therapy.   Subjective: Patient seen and examined at bedside. He should have increased work of breathing overnight. He reports shortness of breath has worsened. Checks x-ray was done overnight which showed worsening pulmonary findings.   Assessment & Plan:  Sepsis from probable CAP - Initially with elevated WBC count, tachycardia, tachypnea with elevated lactate to 2.39, now has normalized. She has x-ray findings are suggestive of possible aspiration probably why he was on heroine overdose. P pain could be due to costochondritis related to CPR reported -We will broaden antibiotic - start Zosyn - white count has increased and symptoms are getting worse -Blood and urine cultures pending - no growth up-to-date -Patient with good oral intake can DC IV fluids -strep pneumo negative  -Slight increase in TNI, likely due to demand ischemia due to sepsis. Normalized -Continue pain medication as needed -Wean O2 supplements, keep sat above 91% -Flutter Valve  -Albuterol PRN   Polysubstance abuse with ?opiate withdrawal - unlikely that this patient is withdrawing, no nausea or vomiting no abdominal pain pupils are normal.  Patient with reported heroin OD and reported bystander CPR -Monitor for opioid withdrawal  -Symptomatic care for now  -Psych evaluation appreciated   HTN - stable  - Will continue to hold hypertensive meds  Hypokalemia - Resolved  - Repeat BMP in am  DVT prophylaxis: Lovenox Code Status: Full Family Communication: None at bedside Disposition Plan: Anticipate  discharge back to home environment when medically stable, due to decompensation patient will remain in hospital.   Consultants:   None   Procedures:   None   Antimicrobials:  Azithromycin 10/17 ->   Rocephin 10/17 -> 10/20  Zosyn 10/20 ->   Objective: Vitals:   08/23/16 1100 08/23/16 2236 08/24/16 0602 08/24/16 0730  BP: (!) 118/59 139/68 135/75 136/67  Pulse: 79 68 64 64  Resp: 20 20 19  (!) 21  Temp: 98.6 F (37 C) 98.1 F (36.7 C) 98.4 F (36.9 C) 98.2 F (36.8 C)  TempSrc: Oral Oral Oral Oral  SpO2:  95% 93% 94%  Weight:   117.8 kg (259 lb 12.8 oz)   Height:        Intake/Output Summary (Last 24 hours) at 08/24/16 1014 Last data filed at 08/24/16 0911  Gross per 24 hour  Intake             2035 ml  Output             1126 ml  Net              909 ml   Filed Weights   08/22/16 0210 08/23/16 0434 08/24/16 0602  Weight: 113.6 kg (250 lb 8 oz) 115.7 kg (255 lb) 117.8 kg (259 lb 12.8 oz)    Examination:  General exam: Appears calm and comfortable  Respiratory system: Diffuse late expiratory wheeze, tender to palpation on the R side.Good Air entry No crackle or rhonchi Cardiovascular system: S1 & S2 heard, RRR. No JVD, murmurs, rubs or gallops Gastrointestinal system: Abdomen is nondistended, soft and nontender. No organomegaly or masses felt.  Central nervous system: Alert  and oriented. No focal neurological deficits. Extremities: No pedal edema. Skin: No rashes, lesions or ulcers Psychiatry: Judgement and insight appear normal. Mood & affect flat.   Data Reviewed: I have personally reviewed following labs and imaging studies  CBC:  Recent Labs Lab 08/21/16 2023 08/22/16 0827 08/24/16 0825  WBC 18.4* 16.1* 20.9*  HGB 14.5 12.5* 11.6*  HCT 42.4 37.6* 33.9*  MCV 87.6 87.6 86.0  PLT 334 278 376   Basic Metabolic Panel:  Recent Labs Lab 08/21/16 2023 08/22/16 0827 08/23/16 1134 08/24/16 0254  NA 132* 133* 136 136  K 3.2* 3.3* 4.1 4.0  CL  94* 103 103 105  CO2 27 21* 27 24  GLUCOSE 120* 139* 114* 149*  BUN 9 7 12 13   CREATININE 1.03 0.91 0.84 0.66  CALCIUM 8.8* 7.7* 8.1* 8.7*   GFR: Estimated Creatinine Clearance: 181.5 mL/min (by C-G formula based on SCr of 0.66 mg/dL). Liver Function Tests: No results for input(s): AST, ALT, ALKPHOS, BILITOT, PROT, ALBUMIN in the last 168 hours. No results for input(s): LIPASE, AMYLASE in the last 168 hours. No results for input(s): AMMONIA in the last 168 hours. Coagulation Profile: No results for input(s): INR, PROTIME in the last 168 hours. Cardiac Enzymes:  Recent Labs Lab 08/22/16 0218 08/22/16 0827 08/22/16 1527  TROPONINI 0.06* <0.03 <0.03   BNP (last 3 results) No results for input(s): PROBNP in the last 8760 hours. HbA1C: No results for input(s): HGBA1C in the last 72 hours. CBG: No results for input(s): GLUCAP in the last 168 hours. Lipid Profile: No results for input(s): CHOL, HDL, LDLCALC, TRIG, CHOLHDL, LDLDIRECT in the last 72 hours. Thyroid Function Tests: No results for input(s): TSH, T4TOTAL, FREET4, T3FREE, THYROIDAB in the last 72 hours. Anemia Panel: No results for input(s): VITAMINB12, FOLATE, FERRITIN, TIBC, IRON, RETICCTPCT in the last 72 hours. Sepsis Labs:  Recent Labs Lab 08/21/16 2250 08/22/16 0038  LATICACIDVEN 2.39* 1.90    Recent Results (from the past 240 hour(s))  Blood Culture (routine x 2)     Status: None (Preliminary result)   Collection Time: 08/21/16 10:35 PM  Result Value Ref Range Status   Specimen Description BLOOD LEFT ANTECUBITAL  Final   Special Requests BOTTLES DRAWN AEROBIC AND ANAEROBIC 5CC  Final   Culture NO GROWTH 2 DAYS  Final   Report Status PENDING  Incomplete  Blood Culture (routine x 2)     Status: None (Preliminary result)   Collection Time: 08/21/16 10:40 PM  Result Value Ref Range Status   Specimen Description BLOOD LEFT HAND  Final   Special Requests BOTTLES DRAWN AEROBIC AND ANAEROBIC 5CC  Final    Culture NO GROWTH 2 DAYS  Final   Report Status PENDING  Incomplete  Urine culture     Status: None   Collection Time: 08/22/16  2:40 AM  Result Value Ref Range Status   Specimen Description URINE, RANDOM  Final   Special Requests NONE  Final   Culture NO GROWTH 1 DAY  Final   Report Status 08/23/2016 FINAL  Final      Radiology Studies: Dg Chest Port 1 View  Result Date: 08/24/2016 CLINICAL DATA:  SOB,RIGHT CHEST PAIN,SHOULDER PAIN,FEVER 100 EXAM: PORTABLE CHEST - 1 VIEW COMPARISON:  08/21/2016 FINDINGS: Persistent right pleural effusion with some interval increase in the consolidation/ atelectasis in the right lower and mid lung. Some increase in streaky atelectasis or infiltrate laterally at the left lung base. Mild central pulmonary vascular congestion is now evident. Heart  size upper limits normal for technique. No pneumothorax. Visualized bones unremarkable. IMPRESSION: 1. Worsening bibasilar airspace opacities right greater than left. 2. Stable right pleural effusion. 3. Cardiomegaly with central pulmonary vascular congestion. Electronically Signed   By: Corlis Leak  Hassell M.D.   On: 08/24/2016 08:58      Scheduled Meds: . azithromycin  500 mg Oral Q24H  . cloNIDine  0.1 mg Oral QID   Followed by  . cloNIDine  0.1 mg Oral BH-qamhs   Followed by  . [START ON 08/27/2016] cloNIDine  0.1 mg Oral QAC breakfast  . DULoxetine  30 mg Oral Daily  . enoxaparin (LOVENOX) injection  60 mg Subcutaneous Q24H  . gabapentin  300 mg Oral TID  . piperacillin-tazobactam (ZOSYN)  IV  3.375 g Intravenous Q8H  . sodium chloride flush  3 mL Intravenous Q12H   Continuous Infusions:     LOS: 3 days    Latrelle DodrillEdwin Silva, MD [facility] Pager [phone]  If 7PM-7AM, please contact night-coverage [email or web address] Password [email or web address] 08/24/2016, 10:14 AM

## 2016-08-24 NOTE — Progress Notes (Signed)
SATURATION QUALIFICATIONS: (This note is used to comply with regulatory documentation for home oxygen)  Patient Saturations on Room Air at Rest = 90%  Patient Saturations on Room Air while Ambulating = 86%  Patient Saturations on 3 Liters of oxygen while Ambulating = 90%  Please briefly explain why patient needs home oxygen: to maintain SaO2 >88%   08/24/2016 Veda CanningLynn Arlington Sigmund, PT Pager: [phone]

## 2016-08-25 LAB — CBC WITH DIFFERENTIAL/PLATELET
BASOS PCT: 0 %
Basophils Absolute: 0 10*3/uL (ref 0.0–0.1)
Eosinophils Absolute: 0.2 10*3/uL (ref 0.0–0.7)
Eosinophils Relative: 1 %
HEMATOCRIT: 35.1 % — AB (ref 39.0–52.0)
HEMOGLOBIN: 12.1 g/dL — AB (ref 13.0–17.0)
LYMPHS ABS: 1.9 10*3/uL (ref 0.7–4.0)
LYMPHS PCT: 10 %
MCH: 29.7 pg (ref 26.0–34.0)
MCHC: 34.5 g/dL (ref 30.0–36.0)
MCV: 86 fL (ref 78.0–100.0)
MONOS PCT: 10 %
Monocytes Absolute: 1.9 10*3/uL — ABNORMAL HIGH (ref 0.1–1.0)
NEUTROS ABS: 15.5 10*3/uL — AB (ref 1.7–7.7)
NEUTROS PCT: 79 %
Platelets: 391 10*3/uL (ref 150–400)
RBC: 4.08 MIL/uL — ABNORMAL LOW (ref 4.22–5.81)
RDW: 12.3 % (ref 11.5–15.5)
WBC: 19.5 10*3/uL — ABNORMAL HIGH (ref 4.0–10.5)

## 2016-08-25 LAB — BASIC METABOLIC PANEL
Anion gap: 9 (ref 5–15)
BUN: 16 mg/dL (ref 6–20)
CHLORIDE: 102 mmol/L (ref 101–111)
CO2: 24 mmol/L (ref 22–32)
CREATININE: 0.79 mg/dL (ref 0.61–1.24)
Calcium: 8 mg/dL — ABNORMAL LOW (ref 8.9–10.3)
GFR calc non Af Amer: >60 mL/min (ref >60)
Glucose, Bld: 108 mg/dL — ABNORMAL HIGH (ref 65–99)
Potassium: 3.5 mmol/L (ref 3.5–5.1)
Sodium: 135 mmol/L (ref 135–145)

## 2016-08-25 MED ORDER — TRAMADOL HCL 50 MG PO TABS
100.0000 mg | ORAL_TABLET | Freq: Four times a day (QID) | ORAL | Status: DC | PRN
  Administered 2016-08-25 – 2016-08-29 (×11): 100 mg via ORAL
  Filled 2016-08-25 (×11): qty 2

## 2016-08-25 MED ORDER — NYSTATIN 100000 UNIT/ML MT SUSP
5.0000 mL | Freq: Four times a day (QID) | OROMUCOSAL | Status: DC
  Administered 2016-08-25 – 2016-08-28 (×15): 500000 [IU] via ORAL
  Filled 2016-08-25 (×18): qty 5

## 2016-08-25 NOTE — Progress Notes (Signed)
Pt complain of severe SOB around 3am this morning, Oxygen sat was maintained at 97% with 2L of oxygen via [place], Breathing Rx provided and IV Toradol given, pt felt relieved after that, will continue to monitor the patient.

## 2016-08-25 NOTE — Progress Notes (Signed)
PROGRESS NOTE [facility]   Bryan CageJames Passarella   ONG:[id number] DOB: 1991/03/07  DOA: 08/21/2016 PCP: No PCP Per Patient   Brief Narrative:  Bryan Jennings is a 25 y.o. male with medical history significant of HTN and polysubstance abuse presenting with rib pain and SOB. Patient admitted for Sepsis 2/2 CAP with poss opioid withdrawals. Patient was evaluated by psych for detox whom recommended outpatient therapy.   Subjective: Patient reports to feels about the same yesterday. No acute events overnight. Off O2 this am sating above 93%. Rib pain continues to be the same Toradol help some.   Assessment & Plan:  Sepsis from probable CAP - Initially with elevated WBC count, tachycardia, tachypnea with elevated lactate to 2.39, now has normalized. She has x-ray findings are suggestive of possible aspiration probably why he was on heroine overdose. P pain could be due to costochondritis related to CPR reported - Increase ultram to 100 mg, cont toradol -We will broaden antibiotic - Cont Zosyn - Labs for today pending  -Blood and urine cultures pending - no growth up-to-date -strep pneumo negative  -Walk patient without O2 supplement, Monitor O2 sat -Flutter Valve  -Albuterol PRN  Polysubstance abuse with ?opiate withdrawal - unlikely that this patient is withdrawing, no nausea or vomiting no abdominal pain pupils are normal.  Patient with reported heroin OD and reported bystander CPR -Monitor for opioid withdrawal  -Symptomatic care for now  -Psych evaluation appreciated   HTN - stable  - Will continue to hold hypertensive meds  Hypokalemia - Resolved  - Repeat BMP in am  DVT prophylaxis: Lovenox Code Status: Full Family Communication: None at bedside Disposition Plan: Anticipate discharge back to home environment when medically stable, due to decompensation patient will remain in hospital.   Consultants:   None   Procedures:   None   Antimicrobials:  Azithromycin 10/17  ->   Rocephin 10/17 -> 10/20  Zosyn 10/20 ->   Objective: Vitals:   08/24/16 2045 08/25/16 0100 08/25/16 0331 08/25/16 0709  BP: (!) 120/55 122/68  138/66  Pulse: (!) 59 62  63  Resp: 16 18  18   Temp: 98.3 F (36.8 C) 98 F (36.7 C)  99.1 F (37.3 C)  TempSrc: Oral Oral  Oral  SpO2: 96% 97% 97% 94%  Weight:    117.9 kg (260 lb)  Height:        Intake/Output Summary (Last 24 hours) at 08/25/16 1029 Last data filed at 08/25/16 0709  Gross per 24 hour  Intake              580 ml  Output             1770 ml  Net            -1190 ml   Filed Weights   08/23/16 0434 08/24/16 0602 08/25/16 0709  Weight: 115.7 kg (255 lb) 117.8 kg (259 lb 12.8 oz) 117.9 kg (260 lb)    Examination:  General exam: Appears calm and comfortable  Respiratory system: Mild late expiratory wheezing. Chest tenderness on the left Good Air entry No crackle. Cardiovascular system: S1 & S2 heard, RRR. No JVD, murmurs, rubs or gallops Gastrointestinal system: Abdomen is nondistended, soft and nontender. No organomegaly or masses felt.  Central nervous system: Alert and oriented. No focal neurological deficits. Extremities: No pedal edema. Skin: No rashes, lesions or ulcers Psychiatry: Judgement and insight appear normal. Mood & affect flat.   Data Reviewed: I have personally reviewed following labs  and imaging studies  CBC:  Recent Labs Lab 08/21/16 2023 08/22/16 0827 08/24/16 0825  WBC 18.4* 16.1* 20.9*  HGB 14.5 12.5* 11.6*  HCT 42.4 37.6* 33.9*  MCV 87.6 87.6 86.0  PLT 334 278 376   Basic Metabolic Panel:  Recent Labs Lab 08/21/16 2023 08/22/16 0827 08/23/16 1134 08/24/16 0254  NA 132* 133* 136 136  K 3.2* 3.3* 4.1 4.0  CL 94* 103 103 105  CO2 27 21* 27 24  GLUCOSE 120* 139* 114* 149*  BUN 9 7 12 13   CREATININE 1.03 0.91 0.84 0.66  CALCIUM 8.8* 7.7* 8.1* 8.7*   GFR: Estimated Creatinine Clearance: 181.7 mL/min (by C-G formula based on SCr of 0.66 mg/dL). Liver Function  Tests: No results for input(s): AST, ALT, ALKPHOS, BILITOT, PROT, ALBUMIN in the last 168 hours. No results for input(s): LIPASE, AMYLASE in the last 168 hours. No results for input(s): AMMONIA in the last 168 hours. Coagulation Profile: No results for input(s): INR, PROTIME in the last 168 hours. Cardiac Enzymes:  Recent Labs Lab 08/22/16 0218 08/22/16 0827 08/22/16 1527  TROPONINI 0.06* <0.03 <0.03   BNP (last 3 results) No results for input(s): PROBNP in the last 8760 hours. HbA1C: No results for input(s): HGBA1C in the last 72 hours. CBG: No results for input(s): GLUCAP in the last 168 hours. Lipid Profile: No results for input(s): CHOL, HDL, LDLCALC, TRIG, CHOLHDL, LDLDIRECT in the last 72 hours. Thyroid Function Tests: No results for input(s): TSH, T4TOTAL, FREET4, T3FREE, THYROIDAB in the last 72 hours. Anemia Panel: No results for input(s): VITAMINB12, FOLATE, FERRITIN, TIBC, IRON, RETICCTPCT in the last 72 hours. Sepsis Labs:  Recent Labs Lab 08/21/16 2250 08/22/16 0038  LATICACIDVEN 2.39* 1.90    Recent Results (from the past 240 hour(s))  Blood Culture (routine x 2)     Status: None (Preliminary result)   Collection Time: 08/21/16 10:35 PM  Result Value Ref Range Status   Specimen Description BLOOD LEFT ANTECUBITAL  Final   Special Requests BOTTLES DRAWN AEROBIC AND ANAEROBIC 5CC  Final   Culture NO GROWTH 3 DAYS  Final   Report Status PENDING  Incomplete  Blood Culture (routine x 2)     Status: None (Preliminary result)   Collection Time: 08/21/16 10:40 PM  Result Value Ref Range Status   Specimen Description BLOOD LEFT HAND  Final   Special Requests BOTTLES DRAWN AEROBIC AND ANAEROBIC 5CC  Final   Culture NO GROWTH 3 DAYS  Final   Report Status PENDING  Incomplete  Urine culture     Status: None   Collection Time: 08/22/16  2:40 AM  Result Value Ref Range Status   Specimen Description URINE, RANDOM  Final   Special Requests NONE  Final   Culture NO  GROWTH 1 DAY  Final   Report Status 08/23/2016 FINAL  Final     Radiology Studies: Dg Chest Port 1 View  Result Date: 08/24/2016 CLINICAL DATA:  SOB,RIGHT CHEST PAIN,SHOULDER PAIN,FEVER 100 EXAM: PORTABLE CHEST - 1 VIEW COMPARISON:  08/21/2016 FINDINGS: Persistent right pleural effusion with some interval increase in the consolidation/ atelectasis in the right lower and mid lung. Some increase in streaky atelectasis or infiltrate laterally at the left lung base. Mild central pulmonary vascular congestion is now evident. Heart size upper limits normal for technique. No pneumothorax. Visualized bones unremarkable. IMPRESSION: 1. Worsening bibasilar airspace opacities right greater than left. 2. Stable right pleural effusion. 3. Cardiomegaly with central pulmonary vascular congestion. Electronically Signed  By: Corlis Leak M.D.   On: 08/24/2016 08:58    Scheduled Meds: . azithromycin  500 mg Oral Q24H  . cloNIDine  0.1 mg Oral BH-qamhs   Followed by  . [START ON 08/27/2016] cloNIDine  0.1 mg Oral QAC breakfast  . DULoxetine  30 mg Oral Daily  . enoxaparin (LOVENOX) injection  60 mg Subcutaneous Q24H  . gabapentin  300 mg Oral TID  . nystatin  5 mL Oral QID  . piperacillin-tazobactam (ZOSYN)  IV  3.375 g Intravenous Q8H  . sodium chloride flush  3 mL Intravenous Q12H   Continuous Infusions:     LOS: 4 days    Latrelle Dodrill, MD [facility] Pager [phone]  If 7PM-7AM, please contact night-coverage [email or web address] Password [email or web address] 08/25/2016, 10:29 AM

## 2016-08-26 DIAGNOSIS — F411 Generalized anxiety disorder: Secondary | ICD-10-CM

## 2016-08-26 LAB — BASIC METABOLIC PANEL
ANION GAP: 9 (ref 5–15)
BUN: 16 mg/dL (ref 6–20)
CALCIUM: 8.1 mg/dL — AB (ref 8.9–10.3)
CO2: 25 mmol/L (ref 22–32)
CREATININE: 0.68 mg/dL (ref 0.61–1.24)
Chloride: 102 mmol/L (ref 101–111)
GFR calc Af Amer: >60 mL/min (ref >60)
GLUCOSE: 95 mg/dL (ref 65–99)
Potassium: 3.9 mmol/L (ref 3.5–5.1)
Sodium: 136 mmol/L (ref 135–145)

## 2016-08-26 LAB — CBC WITH DIFFERENTIAL/PLATELET
BASOS ABS: 0.1 10*3/uL (ref 0.0–0.1)
BASOS PCT: 0 %
EOS ABS: 0.3 10*3/uL (ref 0.0–0.7)
EOS PCT: 2 %
HCT: 36.7 % — ABNORMAL LOW (ref 39.0–52.0)
Hemoglobin: 12.3 g/dL — ABNORMAL LOW (ref 13.0–17.0)
Lymphocytes Relative: 7 %
Lymphs Abs: 1.3 10*3/uL (ref 0.7–4.0)
MCH: 28.8 pg (ref 26.0–34.0)
MCHC: 33.5 g/dL (ref 30.0–36.0)
MCV: 85.9 fL (ref 78.0–100.0)
MONO ABS: 1.8 10*3/uL — AB (ref 0.1–1.0)
Monocytes Relative: 10 %
NEUTROS ABS: 14.6 10*3/uL — AB (ref 1.7–7.7)
Neutrophils Relative %: 81 %
PLATELETS: 403 10*3/uL — AB (ref 150–400)
RBC: 4.27 MIL/uL (ref 4.22–5.81)
RDW: 12.5 % (ref 11.5–15.5)
WBC: 18 10*3/uL — ABNORMAL HIGH (ref 4.0–10.5)

## 2016-08-26 LAB — CULTURE, BLOOD (ROUTINE X 2)
CULTURE: NO GROWTH
Culture: NO GROWTH

## 2016-08-26 MED ORDER — CLINDAMYCIN HCL 300 MG PO CAPS
600.0000 mg | ORAL_CAPSULE | Freq: Three times a day (TID) | ORAL | Status: DC
  Administered 2016-08-26 – 2016-08-28 (×7): 600 mg via ORAL
  Filled 2016-08-26 (×8): qty 2

## 2016-08-26 MED ORDER — CLINDAMYCIN HCL 300 MG PO CAPS: 600.0000 mg | ORAL_CAPSULE | Freq: Two times a day (BID) | ORAL | Status: DC

## 2016-08-26 MED ORDER — DULOXETINE HCL 60 MG PO CPEP
60.0000 mg | ORAL_CAPSULE | Freq: Every day | ORAL | Status: DC
  Administered 2016-08-26 – 2016-08-28 (×3): 60 mg via ORAL
  Filled 2016-08-26 (×3): qty 1

## 2016-08-26 NOTE — Progress Notes (Signed)
Patient ambulatory in hallway.  O2 sat 93% on room air prior to ambulation, O2 sats ranged from 91 to 94% with ambulation.  Patient reported minimal dyspnea.

## 2016-08-26 NOTE — Care Management Note (Addendum)
Case Management Note  Patient Details  Name: Bryan Jennings MRN: [id number] Date of Birth: 06/21/1991  Subjective/Objective:                  presenting with rib pain and SOB Action/Plan: Discharge planning Expected Discharge Date:  08/26/16               Expected Discharge Plan:  Home/Self Care  In-House Referral:     Discharge planning Services  CM Consult, [facility], [facility], Medication Assistance  Post Acute Care Choice:    Choice offered to:  Patient  DME Arranged:    DME Agency:     HH Arranged:    [facility] Agency:     Status of Service:  Completed, signed off  If discussed at [employer], dates discussed:    Additional Comments: CM met with pt in room and gave pt Industry letter with list of participating pharmacies.  Pt states he has enough money for copays.  Pt verbalized understanding of all MATCH parameters.  CM gave pt [facility] pamphlet and pt verbalized understanding he will go to the clinic this Friday 10/27 at 08:30 and ask for appointments to secure a PCP, follow up medical care, and insurance. CM gave pt bus pass to get to [facility]   No other CM needs were communicated. Dellie Catholic, RN 08/26/2016, 3:47 PM

## 2016-08-26 NOTE — Progress Notes (Signed)
PROGRESS NOTE [facility]   Bryan Jennings   ZOX:[id number] DOB: [birth year]  DOA: 08/21/2016 PCP: No PCP Per Bryan Jennings   Brief Narrative:  Bryan Jennings is a 25 y.o. male with medical history significant of HTN and polysubstance abuse presenting with rib pain and SOB. Bryan Jennings admitted for Sepsis 2/2 CAP treated with abx with poss opioid withdrawals. Bryan Jennings was evaluated by psych for detox whom recommended outpatient therapy.   Subjective: Bryan Jennings worry about going home home having some anxiety. Bryan Jennings's report no having the money to buy his medications. Off oxygen this morning saturating above 92%  Assessment & Plan:  Sepsis from probable CAP improving- Initially with elevated WBC count, tachycardia, tachypnea with elevated lactate to 2.39, now has normalized. She has x-ray findings are suggestive of possible aspiration probably why he was on heroine overdose. Pain could be due to costochondritis related to CPR reported -Continue Ultram -Switch Zosyn to clindamycin -Blood and urine cultures pending - no growth up-to-date -strep pneumo negative  -Flutter Valve  -Albuterol PRN  Polysubstance abuse with ?opiate withdrawal - unlikely that this Bryan Jennings is withdrawing, no nausea or vomiting no abdominal pain pupils are normal.  Bryan Jennings with reported heroin OD and reported bystander CPR -Monitor for opioid withdrawal  -Symptomatic care for now  -Psych evaluation appreciated  -Outpatient therapy, she may need methadone or Suboxone  Anxiety -Cymbalta dose increased -Will need outpatient psych  HTN - stable  - Will continue to hold hypertensive meds  Hypokalemia - Resolved  - Repeat BMP in am  DVT prophylaxis: Lovenox Code Status: Full Family Communication: None at bedside Disposition Plan: Anticipate discharge back to home environment when medically stable, possible tomorrow in the morning  Consultants:   None   Procedures:   None   Antimicrobials:  Azithromycin  10/17 ->   Rocephin 10/17 -> 10/20  Zosyn 10/20 ->   Objective: Vitals:   08/25/16 1200 08/25/16 1432 08/25/16 2005 08/26/16 0415  BP: 140/69  (!) 121/53 136/69  Pulse: 68  68 62  Resp: 18  20 20   Temp: 98.4 F (36.9 C)  99.3 F (37.4 C) 99.1 F (37.3 C)  TempSrc: Oral  Oral Oral  SpO2: 98% 94% 95% 97%  Weight:    117.7 kg (259 lb 6.4 oz)  Height:        Intake/Output Summary (Last 24 hours) at 08/26/16 1004 Last data filed at 08/26/16 0626  Gross per 24 hour  Intake              340 ml  Output              425 ml  Net              -85 ml   Filed Weights   08/24/16 0602 08/25/16 0709 08/26/16 0415  Weight: 117.8 kg (259 lb 12.8 oz) 117.9 kg (260 lb) 117.7 kg (259 lb 6.4 oz)    Examination:  General exam: Appears anxious  Respiratory system: Clear to auscultation no wheezing or crackles Cardiovascular system: S1 & S2 heard, RRR. No JVD, murmurs, rubs or gallops Gastrointestinal system: Abdomen is nondistended, soft and nontender. No organomegaly or masses felt.  Central nervous system: Alert and oriented. No focal neurological deficits. Extremities: No pedal edema. Skin: No rashes, lesions or ulcers Psychiatry: Judgement and insight appear normal. Mood & affect flat  Data Reviewed: I have personally reviewed following labs and imaging studies  CBC:  Recent Labs Lab 08/21/16 2023 08/22/16 0827 08/24/16 0825 08/25/16  1358 08/26/16 0328  WBC 18.4* 16.1* 20.9* 19.5* 18.0*  NEUTROABS  --   --   --  15.5* 14.6*  HGB 14.5 12.5* 11.6* 12.1* 12.3*  HCT 42.4 37.6* 33.9* 35.1* 36.7*  MCV 87.6 87.6 86.0 86.0 85.9  PLT 334 278 376 391 403*   Basic Metabolic Panel:  Recent Labs Lab 08/22/16 0827 08/23/16 1134 08/24/16 0254 08/25/16 1358 08/26/16 0328  NA 133* 136 136 135 136  K 3.3* 4.1 4.0 3.5 3.9  CL 103 103 105 102 102  CO2 21* 27 24 24 25   GLUCOSE 139* 114* 149* 108* 95  BUN 7 12 13 16 16   CREATININE 0.91 0.84 0.66 0.79 0.68  CALCIUM 7.7* 8.1* 8.7*  8.0* 8.1*   GFR: Estimated Creatinine Clearance: 181.5 mL/min (by C-G formula based on SCr of 0.68 mg/dL). Liver Function Tests: No results for input(s): AST, ALT, ALKPHOS, BILITOT, PROT, ALBUMIN in the last 168 hours. No results for input(s): LIPASE, AMYLASE in the last 168 hours. No results for input(s): AMMONIA in the last 168 hours. Coagulation Profile: No results for input(s): INR, PROTIME in the last 168 hours. Cardiac Enzymes:  Recent Labs Lab 08/22/16 0218 08/22/16 0827 08/22/16 1527  TROPONINI 0.06* <0.03 <0.03   BNP (last 3 results) No results for input(s): PROBNP in the last 8760 hours. HbA1C: No results for input(s): HGBA1C in the last 72 hours. CBG: No results for input(s): GLUCAP in the last 168 hours. Lipid Profile: No results for input(s): CHOL, HDL, LDLCALC, TRIG, CHOLHDL, LDLDIRECT in the last 72 hours. Thyroid Function Tests: No results for input(s): TSH, T4TOTAL, FREET4, T3FREE, THYROIDAB in the last 72 hours. Anemia Panel: No results for input(s): VITAMINB12, FOLATE, FERRITIN, TIBC, IRON, RETICCTPCT in the last 72 hours. Sepsis Labs:  Recent Labs Lab 08/21/16 2250 08/22/16 0038  LATICACIDVEN 2.39* 1.90    Recent Results (from the past 240 hour(s))  Blood Culture (routine x 2)     Status: None (Preliminary result)   Collection Time: 08/21/16 10:35 PM  Result Value Ref Range Status   Specimen Description BLOOD LEFT ANTECUBITAL  Final   Special Requests BOTTLES DRAWN AEROBIC AND ANAEROBIC 5CC  Final   Culture NO GROWTH 4 DAYS  Final   Report Status PENDING  Incomplete  Blood Culture (routine x 2)     Status: None (Preliminary result)   Collection Time: 08/21/16 10:40 PM  Result Value Ref Range Status   Specimen Description BLOOD LEFT HAND  Final   Special Requests BOTTLES DRAWN AEROBIC AND ANAEROBIC 5CC  Final   Culture NO GROWTH 4 DAYS  Final   Report Status PENDING  Incomplete  Urine culture     Status: None   Collection Time: 08/22/16  2:40  AM  Result Value Ref Range Status   Specimen Description URINE, RANDOM  Final   Special Requests NONE  Final   Culture NO GROWTH 1 DAY  Final   Report Status 08/23/2016 FINAL  Final     Radiology Studies: No results found.  Scheduled Meds: . azithromycin  500 mg Oral Q24H  . clindamycin  600 mg Oral TID  . [START ON 08/27/2016] cloNIDine  0.1 mg Oral QAC breakfast  . DULoxetine  60 mg Oral Daily  . enoxaparin (LOVENOX) injection  60 mg Subcutaneous Q24H  . gabapentin  300 mg Oral TID  . nystatin  5 mL Oral QID  . sodium chloride flush  3 mL Intravenous Q12H   Continuous Infusions:  LOS: 5 days    Latrelle Dodrill, MD [facility] Pager [phone]  If 7PM-7AM, please contact night-coverage [email or web address] Password [email or web address] 08/26/2016, 10:04 AM

## 2016-08-27 ENCOUNTER — Inpatient Hospital Stay (HOSPITAL_COMMUNITY): Payer: Self-pay

## 2016-08-27 ENCOUNTER — Encounter (HOSPITAL_COMMUNITY): Payer: Self-pay | Admitting: Radiology

## 2016-08-27 DIAGNOSIS — J189 Pneumonia, unspecified organism: Secondary | ICD-10-CM

## 2016-08-27 DIAGNOSIS — A419 Sepsis, unspecified organism: Principal | ICD-10-CM

## 2016-08-27 DIAGNOSIS — I10 Essential (primary) hypertension: Secondary | ICD-10-CM

## 2016-08-27 DIAGNOSIS — J690 Pneumonitis due to inhalation of food and vomit: Secondary | ICD-10-CM

## 2016-08-27 LAB — BASIC METABOLIC PANEL
Anion gap: 11 (ref 5–15)
BUN: 14 mg/dL (ref 6–20)
CALCIUM: 8.1 mg/dL — AB (ref 8.9–10.3)
CO2: 24 mmol/L (ref 22–32)
Chloride: 101 mmol/L (ref 101–111)
Creatinine, Ser: 0.75 mg/dL (ref 0.61–1.24)
GFR calc Af Amer: >60 mL/min (ref >60)
GLUCOSE: 78 mg/dL (ref 65–99)
Potassium: 3.6 mmol/L (ref 3.5–5.1)
Sodium: 136 mmol/L (ref 135–145)

## 2016-08-27 LAB — CBC WITH DIFFERENTIAL/PLATELET
Basophils Absolute: 0 10*3/uL (ref 0.0–0.1)
Basophils Relative: 0 %
EOS PCT: 1 %
Eosinophils Absolute: 0.1 10*3/uL (ref 0.0–0.7)
HCT: 34.8 % — ABNORMAL LOW (ref 39.0–52.0)
Hemoglobin: 11.7 g/dL — ABNORMAL LOW (ref 13.0–17.0)
LYMPHS ABS: 1.8 10*3/uL (ref 0.7–4.0)
LYMPHS PCT: 7 %
MCH: 29.2 pg (ref 26.0–34.0)
MCHC: 33.6 g/dL (ref 30.0–36.0)
MCV: 86.8 fL (ref 78.0–100.0)
MONO ABS: 1.8 10*3/uL — AB (ref 0.1–1.0)
Monocytes Relative: 7 %
Neutro Abs: 20.8 10*3/uL — ABNORMAL HIGH (ref 1.7–7.7)
Neutrophils Relative %: 85 %
PLATELETS: 368 10*3/uL (ref 150–400)
RBC: 4.01 MIL/uL — ABNORMAL LOW (ref 4.22–5.81)
RDW: 12.7 % (ref 11.5–15.5)
WBC: 24.6 10*3/uL — ABNORMAL HIGH (ref 4.0–10.5)

## 2016-08-27 MED ORDER — PREDNISONE 20 MG PO TABS: 20.0000 mg | ORAL_TABLET | Freq: Two times a day (BID) | ORAL | Status: DC

## 2016-08-27 NOTE — Progress Notes (Signed)
Alert and anxious most of the Day. With some periods of sleep. SOB. With Sats in mid 90s. Antibiotics continued and start steriods. CT of Chest ordered. poss thoracentesis today or tomorrow.

## 2016-08-27 NOTE — Consult Note (Signed)
Name: Rashaun Wichert MRN: [id number] DOB: 1991/09/22    ADMISSION DATE:  08/21/2016 CONSULTATION DATE:  08/27/2016  REFERRING MD :  Dr. Randel Pigg  CHIEF COMPLAINT:  Effusion  HISTORY OF PRESENT ILLNESS:  25 year old male with PMH of HTN and substance abuse including IVDA. Apparently he overdosed on heroin 10/13 and a bystander performed CPR. He woke up with EMS by narcan and declined hospitalization at that time. He felt as though he had cracked a rib and has had chest pain. He presented to the emergency department a few days later 10/18 with CC SOB and chest pain. He was admitted to the hospitalist team for sepsis/CAP. He was treated with CAP antibiotics. Unfortunately he did not have much improvement in his symptoms and WBC continued to rise. He was broadened to Zosyn. 10/23 CXR demonstrated large R pleural effusion. Pulmonary consulted for possible thora.  SIGNIFICANT EVENTS    STUDIES:  CT chest 10/17 > Limited evaluation for pulmonary embolism due to respiratory motion artifact. No definite central pulmonary artery embolus identified. Small right pleural effusion with right lung base atelectasis versus pneumonia. Small patchy left lung base density may represent atelectatic changes versus infiltrate.  CT chest 10/23 >>>  PAST MEDICAL HISTORY :   has a past medical history of Hypertension and Substance abuse.  has a past surgical history that includes Appendectomy. Prior to Admission medications   Medication Sig Start Date End Date Taking? Authorizing Provider  hydrochlorothiazide (HYDRODIURIL) 25 MG tablet Take 12.5 mg by mouth daily.   Yes Historical Provider, MD  ibuprofen (ADVIL,MOTRIN) 800 MG tablet Take 800 mg by mouth every 6 (six) hours as needed for moderate pain.   Yes Historical Provider, MD  traMADol (ULTRAM) 50 MG tablet Take 50 mg by mouth every 6 (six) hours as needed for severe pain.    Yes Historical Provider, MD  DULoxetine (CYMBALTA) 30 MG capsule Take 1  capsule (30 mg total) by mouth daily. For depression Patient not taking: Reported on 08/21/2016 11/06/13   Sanjuana Kava, NP  gabapentin (NEURONTIN) 300 MG capsule Take 1 capsule (300 mg total) by mouth 3 (three) times daily. For withdrawal related anxiety symptoms Patient not taking: Reported on 08/21/2016 11/06/13   Sanjuana Kava, NP  hydrOXYzine (ATARAX/VISTARIL) 25 MG tablet Take 1 tablet (25 mg) three times daily for anxiety Patient not taking: Reported on 08/21/2016 11/06/13   Sanjuana Kava, NP   No Known Allergies  FAMILY HISTORY:  family history includes CAD (age of onset: 33) in his father; Diabetes in his mother; Hypertension in his mother. SOCIAL HISTORY:  reports that he has been smoking Cigarettes.  He has a 12.00 pack-year smoking history. He has never used smokeless tobacco. He reports that he drinks alcohol. He reports that he uses drugs, including Cocaine, Heroin, and Benzodiazepines.  REVIEW OF SYSTEMS:   Bolds are positive  Constitutional: weight loss, gain, night sweats, Fevers, chills, fatigue .  HEENT: headaches, Sore throat, sneezing, nasal congestion, post nasal drip, Difficulty swallowing, Tooth/dental problems, visual complaints visual changes, ear ache CV:  chest pain, radiates: ,Orthopnea, PND, swelling in lower extremities, dizziness, palpitations, syncope.  GI  heartburn, indigestion, abdominal pain, nausea, vomiting, diarrhea, change in bowel habits, loss of appetite, bloody stools.  Resp: cough, productive: , hemoptysis, dyspnea, chest pain, pleuritic.  Skin: rash or itching or icterus GU: dysuria, change in color of urine, urgency or frequency. flank pain, hematuria  MS: joint pain or swelling. decreased range of  motion  Psych: change in mood or affect. depression or anxiety.  Neuro: difficulty with speech, weakness, numbness, ataxia    SUBJECTIVE:   VITAL SIGNS: Temp:  [98.8 F (37.1 C)-98.9 F (37.2 C)] 98.9 F (37.2 C) (10/23 1213) Pulse Rate:  [71-98]  98 (10/23 1213) Resp:  [18-20] 18 (10/23 1213) BP: (130-144)/(72-81) 130/80 (10/23 1213) SpO2:  [92 %-94 %] 94 % (10/23 1213) Weight:  [117.4 kg (258 lb 12.8 oz)] 117.4 kg (258 lb 12.8 oz) (10/23 0404)  PHYSICAL EXAMINATION: General:  Obese male in NAD room air Neuro:  Alert, oriented, non-focal HEENT:  [place]/AT, PERRL, no JVD Cardiovascular:  RRR, no MRG Lungs:  R diminished whole way up back. Shallow rapid respirations due to pleuritic pain.  Abdomen:  Soft, non-tender Musculoskeletal:  No acute deformity or ROM limitation Skin:  Grossly intact   Recent Labs Lab 08/25/16 1358 08/26/16 0328 08/27/16 0403  NA 135 136 136  K 3.5 3.9 3.6  CL 102 102 101  CO2 24 25 24   BUN 16 16 14   CREATININE 0.79 0.68 0.75  GLUCOSE 108* 95 78    Recent Labs Lab 08/25/16 1358 08/26/16 0328 08/27/16 0403  HGB 12.1* 12.3* 11.7*  HCT 35.1* 36.7* 34.8*  WBC 19.5* 18.0* 24.6*  PLT 391 403* 368   Dg Chest 2 View  Result Date: 08/27/2016 CLINICAL DATA:  Shortness of breath for 1 week.  Pneumonia. EXAM: CHEST  2 VIEW COMPARISON:  08/24/2016 chest radiograph. FINDINGS: Stable cardiomediastinal silhouette with top-normal heart size. No pneumothorax. Moderate right pleural effusion is increased. No left pleural effusion. There is worsening consolidation and volume loss in the mid to lower right lung. No consolidative airspace disease in the left lung. IMPRESSION: 1. Moderate right pleural effusion, increased. 2. Worsening consolidation and volume loss in the mid to lower right lung, likely combination of pneumonia and atelectasis. Recommend follow-up PA and lateral post treatment chest radiographs to document resolution. Electronically Signed   By: Delbert PhenixJason A Poff M.D.   On: 08/27/2016 08:26    ASSESSMENT / PLAN:  Large R pleural effusion - Concern or hemothorax vs parapneumonic effusion based on history and onset of effusion. It was small on CT at time of admission, now grown to be quite large over  past 23-48 hours.  - Repeat CT chest to better characterize effusion - Based on CT will consider thoracentesis vs CVTS consultation for CT placement vs VATS  CAP secondary to suspected aspiration PNA - Continue ABX per primary (broadened to Zosyn 10/20) - Cultures negative - Assess procalcitonin to assist with antibiotic stewardship. - Follow WBC and fever curve  Joneen RoachPaul Hoffman, AGACNP-BC [facility] Pager [phone] or [phone]  08/27/2016 4:48 PM  STAFF NOTE: Cindi CarbonI, Daniel Feinstein, MD FACP have personally reviewed patient's available data, including medical history, events of note, physical examination and test results as part of my evaluation. I have discussed with resident/NP and other care providers such as pharmacist, RN and RRT. In addition, I personally evaluated patient and elicited key findings of: awake, no distress, mild bronchial BS and mild egophony rt base, rising new rt base infiltrate / possible effusion, he received cpr so is at risk hemothorax, this also could be ATX  Given physical findings, will repeat CT chest and assess then consider PCCM to US and thora if indicated, will obtain  coags and HIV, treat pleuritic CP he is having and splinting may be contributing to new findings = ATX?, will follow dry CT and  pt   Mcarthur Rossetti. Tyson Alias, MD, FACP Pgr: [phone] [facility] 08/27/2016 6:00 PM

## 2016-08-27 NOTE — Progress Notes (Addendum)
Pt complained of severe  backpain and was asking for toradol but [facility] informed MD, no any other medicine prescribed at this time, K-pad provided, will continue to monitor.

## 2016-08-27 NOTE — Progress Notes (Signed)
PROGRESS NOTE [facility]   Bryan Jennings   ZOX:[id number] DOB: Dec 22, 1990  DOA: 08/21/2016 PCP: No PCP Per Patient   Brief Narrative:  Bryan Jennings is a 25 y.o. male with medical history significant of HTN and polysubstance abuse presenting with rib pain and SOB. Patient admitted for Sepsis 2/2 CAP treated with abx with poss opioid withdrawals. Patient was evaluated by psych for detox whom recommended outpatient therapy.   Subjective: Today patient feeling better, not requiring O2 overnight. CXR in the AM showed worsening of right side pleural effusion, with R consolidation. Yesterday was switch from zosyn to clinda. No acute events overnight.   Assessment & Plan:  Sepsis from probable Aspiration PNA - patient clinically improving although CXR in the AM showed worsening of right side pleural effusion, with R consolidation.  Initially with elevated WBC count, tachycardia, tachypnea with elevated lactate to 2.39, now has normalized. He has x-ray findings are suggestive of possible aspiration probably while he was down on  heroine overdose. Pain could be due to costochondritis related to CPR reported -Pulmonary consult - ? Effusion need to be tap -Continue Ultram -Continue clindamycin -Mobilize -Blood and urine cultures negative  -strep pneumo negative  -Flutter Valve  -Albuterol PRN  Polysubstance abuse with ?opiate withdrawal - unlikely that this patient is withdrawing, no nausea or vomiting no abdominal pain pupils are normal. No withdrawals noted   Patient with reported heroin OD and reported bystander CPR -Symptomatic care for now  -Psych evaluation appreciated  -Outpatient therapy, she may need methadone or Suboxone  Anxiety -  -Continue Cymbalta -Will need outpatient psych  HTN - stable  - Will continue to hold hypertensive meds ? if actually need HTN meds young patient, BP has been stable off medications  - Continue to monitor   Hypokalemia - Resolved   DVT  prophylaxis: Lovenox Code Status: Full Family Communication: None at bedside Disposition Plan: Anticipate discharge back to home environment when medically stable  Consultants:   None   Procedures:   None   Antimicrobials:  Azithromycin 10/17 ->   Rocephin 10/17 -> 10/20  Zosyn 10/20 ->   Objective: Vitals:   08/26/16 2010 08/27/16 0404 08/27/16 0914 08/27/16 1213  BP: 137/72 (!) 144/81  130/80  Pulse: 95 71  98  Resp: 20 20  18   Temp: 98.8 F (37.1 C) 98.9 F (37.2 C)  98.9 F (37.2 C)  TempSrc: Oral Oral  Oral  SpO2: 93% 93% 92% 94%  Weight:  117.4 kg (258 lb 12.8 oz)    Height:        Intake/Output Summary (Last 24 hours) at 08/27/16 1217 Last data filed at 08/27/16 0846  Gross per 24 hour  Intake             1200 ml  Output              500 ml  Net              700 ml   Filed Weights   08/25/16 0709 08/26/16 0415 08/27/16 0404  Weight: 117.9 kg (260 lb) 117.7 kg (259 lb 6.4 oz) 117.4 kg (258 lb 12.8 oz)    Examination:  General exam: Appears anxious  Respiratory system: Decrease breath sounds on the R, no wheezing  Cardiovascular system: S1 & S2 heard, RRR. No JVD, murmurs, rubs or gallops Gastrointestinal system: Abdomen is nondistended, soft and nontender. No organomegaly or masses felt.  Central nervous system: Alert and oriented. No focal neurological  deficits. Extremities: No pedal edema. Skin: No rashes, lesions or ulcers Psychiatry: Judgement and insight appear normal. Mood & affect flat  Data Reviewed: I have personally reviewed following labs and imaging studies  CBC:  Recent Labs Lab 08/22/16 0827 08/24/16 0825 08/25/16 1358 08/26/16 0328 08/27/16 0403  WBC 16.1* 20.9* 19.5* 18.0* 24.6*  NEUTROABS  --   --  15.5* 14.6* 20.8*  HGB 12.5* 11.6* 12.1* 12.3* 11.7*  HCT 37.6* 33.9* 35.1* 36.7* 34.8*  MCV 87.6 86.0 86.0 85.9 86.8  PLT 278 376 391 403* 368   Basic Metabolic Panel:  Recent Labs Lab 08/23/16 1134 08/24/16 0254  08/25/16 1358 08/26/16 0328 08/27/16 0403  NA 136 136 135 136 136  K 4.1 4.0 3.5 3.9 3.6  CL 103 105 102 102 101  CO2 27 24 24 25 24   GLUCOSE 114* 149* 108* 95 78  BUN 12 13 16 16 14   CREATININE 0.84 0.66 0.79 0.68 0.75  CALCIUM 8.1* 8.7* 8.0* 8.1* 8.1*   GFR: Estimated Creatinine Clearance: 181.3 mL/min (by C-G formula based on SCr of 0.75 mg/dL). Liver Function Tests: No results for input(s): AST, ALT, ALKPHOS, BILITOT, PROT, ALBUMIN in the last 168 hours. No results for input(s): LIPASE, AMYLASE in the last 168 hours. No results for input(s): AMMONIA in the last 168 hours. Coagulation Profile: No results for input(s): INR, PROTIME in the last 168 hours. Cardiac Enzymes:  Recent Labs Lab 08/22/16 0218 08/22/16 0827 08/22/16 1527  TROPONINI 0.06* <0.03 <0.03   BNP (last 3 results) No results for input(s): PROBNP in the last 8760 hours. HbA1C: No results for input(s): HGBA1C in the last 72 hours. CBG: No results for input(s): GLUCAP in the last 168 hours. Lipid Profile: No results for input(s): CHOL, HDL, LDLCALC, TRIG, CHOLHDL, LDLDIRECT in the last 72 hours. Thyroid Function Tests: No results for input(s): TSH, T4TOTAL, FREET4, T3FREE, THYROIDAB in the last 72 hours. Anemia Panel: No results for input(s): VITAMINB12, FOLATE, FERRITIN, TIBC, IRON, RETICCTPCT in the last 72 hours. Sepsis Labs:  Recent Labs Lab 08/21/16 2250 08/22/16 0038  LATICACIDVEN 2.39* 1.90    Recent Results (from the past 240 hour(s))  Blood Culture (routine x 2)     Status: None   Collection Time: 08/21/16 10:35 PM  Result Value Ref Range Status   Specimen Description BLOOD LEFT ANTECUBITAL  Final   Special Requests BOTTLES DRAWN AEROBIC AND ANAEROBIC 5CC  Final   Culture NO GROWTH 5 DAYS  Final   Report Status 08/26/2016 FINAL  Final  Blood Culture (routine x 2)     Status: None   Collection Time: 08/21/16 10:40 PM  Result Value Ref Range Status   Specimen Description BLOOD LEFT  HAND  Final   Special Requests BOTTLES DRAWN AEROBIC AND ANAEROBIC 5CC  Final   Culture NO GROWTH 5 DAYS  Final   Report Status 08/26/2016 FINAL  Final  Urine culture     Status: None   Collection Time: 08/22/16  2:40 AM  Result Value Ref Range Status   Specimen Description URINE, RANDOM  Final   Special Requests NONE  Final   Culture NO GROWTH 1 DAY  Final   Report Status 08/23/2016 FINAL  Final     Radiology Studies: Dg Chest 2 View  Result Date: 08/27/2016 CLINICAL DATA:  Shortness of breath for 1 week.  Pneumonia. EXAM: CHEST  2 VIEW COMPARISON:  08/24/2016 chest radiograph. FINDINGS: Stable cardiomediastinal silhouette with top-normal heart size. No pneumothorax. Moderate right pleural  effusion is increased. No left pleural effusion. There is worsening consolidation and volume loss in the mid to lower right lung. No consolidative airspace disease in the left lung. IMPRESSION: 1. Moderate right pleural effusion, increased. 2. Worsening consolidation and volume loss in the mid to lower right lung, likely combination of pneumonia and atelectasis. Recommend follow-up PA and lateral post treatment chest radiographs to document resolution. Electronically Signed   By: Delbert PhenixJason A Poff M.D.   On: 08/27/2016 08:26    Scheduled Meds: . azithromycin  500 mg Oral Q24H  . clindamycin  600 mg Oral TID  . cloNIDine  0.1 mg Oral QAC breakfast  . DULoxetine  60 mg Oral Daily  . enoxaparin (LOVENOX) injection  60 mg Subcutaneous Q24H  . gabapentin  300 mg Oral TID  . nystatin  5 mL Oral QID  . sodium chloride flush  3 mL Intravenous Q12H   Continuous Infusions:     LOS: 6 days    Bryan DodrillEdwin Silva, MD [facility] Pager [phone]  If 7PM-7AM, please contact night-coverage [email or web address] Password [email or web address] 08/27/2016, 12:17 PM

## 2016-08-28 DIAGNOSIS — J90 Pleural effusion, not elsewhere classified: Secondary | ICD-10-CM

## 2016-08-28 LAB — CBC WITH DIFFERENTIAL/PLATELET
BASOS ABS: 0 10*3/uL (ref 0.0–0.1)
BASOS PCT: 0 %
EOS PCT: 1 %
Eosinophils Absolute: 0.2 10*3/uL (ref 0.0–0.7)
HCT: 36.4 % — ABNORMAL LOW (ref 39.0–52.0)
Hemoglobin: 11.9 g/dL — ABNORMAL LOW (ref 13.0–17.0)
Lymphocytes Relative: 7 %
Lymphs Abs: 1.6 10*3/uL (ref 0.7–4.0)
MCH: 29.2 pg (ref 26.0–34.0)
MCHC: 32.7 g/dL (ref 30.0–36.0)
MCV: 89.2 fL (ref 78.0–100.0)
MONO ABS: 1.8 10*3/uL — AB (ref 0.1–1.0)
Monocytes Relative: 7 %
Neutro Abs: 20.5 10*3/uL — ABNORMAL HIGH (ref 1.7–7.7)
Neutrophils Relative %: 85 %
PLATELETS: 364 10*3/uL (ref 150–400)
RBC: 4.08 MIL/uL — ABNORMAL LOW (ref 4.22–5.81)
RDW: 13.4 % (ref 11.5–15.5)
WBC: 24.1 10*3/uL — ABNORMAL HIGH (ref 4.0–10.5)

## 2016-08-28 LAB — BLOOD GAS, ARTERIAL
Acid-Base Excess: 2.3 mmol/L — ABNORMAL HIGH (ref 0.0–2.0)
Bicarbonate: 25.4 mmol/L (ref 20.0–28.0)
Drawn by: 418751
FIO2: 21
O2 Saturation: 89.3 %
Patient temperature: 98.6
pCO2 arterial: 33.4 mmHg (ref 32.0–48.0)
pH, Arterial: 7.494 — ABNORMAL HIGH (ref 7.350–7.450)
pO2, Arterial: 55.4 mmHg — ABNORMAL LOW (ref 83.0–108.0)

## 2016-08-28 LAB — COMPREHENSIVE METABOLIC PANEL
ALT: 44 U/L (ref 17–63)
AST: 54 U/L — ABNORMAL HIGH (ref 15–41)
Albumin: 2.1 g/dL — ABNORMAL LOW (ref 3.5–5.0)
Alkaline Phosphatase: 65 U/L (ref 38–126)
Anion gap: 9 (ref 5–15)
BUN: 15 mg/dL (ref 6–20)
CO2: 23 mmol/L (ref 22–32)
Calcium: 8.1 mg/dL — ABNORMAL LOW (ref 8.9–10.3)
Chloride: 105 mmol/L (ref 101–111)
Creatinine, Ser: 0.93 mg/dL (ref 0.61–1.24)
GFR calc Af Amer: >60 mL/min (ref >60)
GFR calc non Af Amer: >60 mL/min (ref >60)
Glucose, Bld: 92 mg/dL (ref 65–99)
Potassium: 4.5 mmol/L (ref 3.5–5.1)
Sodium: 137 mmol/L (ref 135–145)
Total Bilirubin: 0.5 mg/dL (ref 0.3–1.2)
Total Protein: 6.3 g/dL — ABNORMAL LOW (ref 6.5–8.1)

## 2016-08-28 LAB — CBC
HCT: 36.6 % — ABNORMAL LOW (ref 39.0–52.0)
Hemoglobin: 11.9 g/dL — ABNORMAL LOW (ref 13.0–17.0)
MCH: 28.7 pg (ref 26.0–34.0)
MCHC: 32.5 g/dL (ref 30.0–36.0)
MCV: 88.4 fL (ref 78.0–100.0)
Platelets: 356 10*3/uL (ref 150–400)
RBC: 4.14 MIL/uL — ABNORMAL LOW (ref 4.22–5.81)
RDW: 13 % (ref 11.5–15.5)
WBC: 23.5 10*3/uL — ABNORMAL HIGH (ref 4.0–10.5)

## 2016-08-28 LAB — PROTIME-INR
INR: 1.13
INR: 1.08
PROTHROMBIN TIME: 14.6 s (ref 11.4–15.2)
Prothrombin Time: 14.1 seconds (ref 11.4–15.2)

## 2016-08-28 LAB — PREPARE RBC (CROSSMATCH)

## 2016-08-28 LAB — BASIC METABOLIC PANEL
Anion gap: 9 (ref 5–15)
BUN: 13 mg/dL (ref 6–20)
CALCIUM: 8 mg/dL — AB (ref 8.9–10.3)
CO2: 27 mmol/L (ref 22–32)
Chloride: 100 mmol/L — ABNORMAL LOW (ref 101–111)
Creatinine, Ser: 0.95 mg/dL (ref 0.61–1.24)
GFR calc Af Amer: >60 mL/min (ref >60)
GLUCOSE: 84 mg/dL (ref 65–99)
Potassium: 3.9 mmol/L (ref 3.5–5.1)
Sodium: 136 mmol/L (ref 135–145)

## 2016-08-28 LAB — PROCALCITONIN: Procalcitonin: 0.4 ng/mL

## 2016-08-28 LAB — HIV ANTIBODY (ROUTINE TESTING W REFLEX): HIV Screen 4th Generation wRfx: NONREACTIVE

## 2016-08-28 LAB — APTT: aPTT: 31 seconds (ref 24–36)

## 2016-08-28 LAB — ABO/RH: ABO/RH(D): A POS

## 2016-08-28 MED ORDER — METHOCARBAMOL 1000 MG/10ML IJ SOLN
500.0000 mg | Freq: Three times a day (TID) | INTRAVENOUS | Status: DC | PRN
  Administered 2016-08-28: 500 mg via INTRAVENOUS
  Filled 2016-08-28 (×2): qty 5

## 2016-08-28 MED ORDER — PIPERACILLIN-TAZOBACTAM 3.375 G IVPB
3.3750 g | Freq: Three times a day (TID) | INTRAVENOUS | Status: DC
  Administered 2016-08-28 – 2016-08-31 (×8): 3.375 g via INTRAVENOUS
  Filled 2016-08-28 (×12): qty 50

## 2016-08-28 MED ORDER — KETOROLAC TROMETHAMINE 30 MG/ML IJ SOLN
30.0000 mg | Freq: Three times a day (TID) | INTRAMUSCULAR | Status: DC | PRN
  Administered 2016-08-28 – 2016-08-29 (×3): 30 mg via INTRAVENOUS
  Filled 2016-08-28 (×3): qty 1

## 2016-08-28 MED ORDER — PIPERACILLIN-TAZOBACTAM 3.375 G IVPB 30 MIN: 3.3750 g | Freq: Three times a day (TID) | INTRAVENOUS | Status: DC

## 2016-08-28 MED ORDER — DEXTROSE 5 % IV SOLN
1.5000 g | INTRAVENOUS | Status: AC
  Administered 2016-08-29: 1.5 g via INTRAVENOUS
  Filled 2016-08-28: qty 1.5

## 2016-08-28 NOTE — Progress Notes (Signed)
PROGRESS NOTE [facility]   Bryan Jennings   WUJ:[id number] DOB: Mar 06, 1991  DOA: 08/21/2016 PCP: No PCP Per Patient   Brief Narrative:  Bryan Jennings is a 25 y.o. male with medical history significant of HTN and polysubstance abuse presenting with rib pain and SOB. Patient had an episode of heroin overdose, and reported CPR. Patient admitted for Sepsis 2/2 aspiration PNA treated with abx with poss opioid withdrawals. Patient was evaluated by psych for detox whom recommended outpatient therapy. During hospital stay patient as been up and down with his improvement. Patient was initially treated with Rocephin and Azythro (completed 5 days), Rocephin was switched to Zosyn, subsequently to Clindamycin when clinically improvement was noted. On hospital day 6 patient decompensated, having increase in WOB and pain, CXR repeated, CT showed rib fractures with increase in pleural effusion. CCM and TCTS consulted for possible tap or chest tube.   Subjective: Breathing getting worse, now back on oxygen supplements. CT shows worsening of pleural effusion. No acute events overnight, afebrile.   Assessment & Plan:  Aspiration PNA - Sepsis has resolved. Repeated CT showed large pleural effusion, ? Parapneumonic effusion. HIV negative, Procalcitonin 0.40 On admission with elevated WBC count, tachycardia, tachypnea with elevated lactate to 2.39, now has normalized. He has x-ray findings are suggestive of possible aspiration, probably due heroine overdose.  -Pulmonary consult recs appreciated  -TCTS consult - ?Tap vs chest tube  -Will switch back to Zosyn given patient deterioration, and increase PCT - source of infection likely needs to be removed  -Blood and urine cultures negative - repeat blood cultures if spike fever -O2 supplement as needed -strep pneumo negative  -Flutter Valve  -Albuterol PRN  5th Rib fracture minimally displaced  -Pain med - robaxin, ultram, Toradol  -Avoid escalation of  opioids - patient want to detox  -Bedrest   Polysubstance abuse with ?opiate withdrawal - unlikely that this patient is withdrawing, no nausea or vomiting no abdominal pain pupils are normal. No withdrawals noted - Psych recommended outpatient rehab -Symptomatic care for now  -Psych evaluation appreciated  -Outpatient therapy, He may benefit from methadone or Suboxone when out in the community   Anxiety -  -Continue Cymbalta -Will need outpatient psych  HTN - stable  - Will continue to hold hypertensive meds ? if actually need HTN meds young patient, BP has been stable off medications  - Continue to monitor   Hypokalemia - Resolved   DVT prophylaxis: Lovenox Code Status: Full Family Communication: None at bedside Disposition Plan: Patient not stable for d/c given recent deterioration, home when medically stable   Consultants:   None   Procedures:   None   Antimicrobials:  Azithromycin 10/17 ->   Rocephin 10/17 -> 10/20  Zosyn 10/20 ->   Objective: Vitals:   08/27/16 1213 08/27/16 2028 08/28/16 0534 08/28/16 1105  BP: 130/80 (!) 133/58 (!) 124/59   Pulse: 98 95 (!) 108   Resp: 18 19 18    Temp: 98.9 F (37.2 C) 100.1 F (37.8 C) 99 F (37.2 C)   TempSrc: Oral Oral Oral   SpO2: 94% 95% 96% 91%  Weight:   117.4 kg (258 lb 12.8 oz)   Height:        Intake/Output Summary (Last 24 hours) at 08/28/16 1129 Last data filed at 08/28/16 1011  Gross per 24 hour  Intake             1200 ml  Output  100 ml  Net             1100 ml   Filed Weights   08/26/16 0415 08/27/16 0404 08/28/16 0534  Weight: 117.7 kg (259 lb 6.4 oz) 117.4 kg (258 lb 12.8 oz) 117.4 kg (258 lb 12.8 oz)    Examination:  General exam: Appears anxious  Respiratory system: Decrease breath sounds on the R, egophony, mild exp wheezing  Cardiovascular system: S1 & S2 heard, RRR. No JVD, murmurs, rubs or gallops Gastrointestinal system: Abdomen is nondistended, soft and nontender.  No organomegaly or masses felt.  Central nervous system: Alert and oriented. No focal neurological deficits. Extremities: No pedal edema. Skin: No rashes, lesions or ulcers Psychiatry: Judgement and insight appear normal. Mood & affect flat  Data Reviewed: I have personally reviewed following labs and imaging studies  CBC:  Recent Labs Lab 08/24/16 0825 08/25/16 1358 08/26/16 0328 08/27/16 0403 08/28/16 0436  WBC 20.9* 19.5* 18.0* 24.6* 24.1*  NEUTROABS  --  15.5* 14.6* 20.8* 20.5*  HGB 11.6* 12.1* 12.3* 11.7* 11.9*  HCT 33.9* 35.1* 36.7* 34.8* 36.4*  MCV 86.0 86.0 85.9 86.8 89.2  PLT 376 391 403* 368 364   Basic Metabolic Panel:  Recent Labs Lab 08/24/16 0254 08/25/16 1358 08/26/16 0328 08/27/16 0403 08/28/16 0436  NA 136 135 136 136 136  K 4.0 3.5 3.9 3.6 3.9  CL 105 102 102 101 100*  CO2 24 24 25 24 27   GLUCOSE 149* 108* 95 78 84  BUN 13 16 16 14 13   CREATININE 0.66 0.79 0.68 0.75 0.95  CALCIUM 8.7* 8.0* 8.1* 8.1* 8.0*   GFR: Estimated Creatinine Clearance: 152.7 mL/min (by C-G formula based on SCr of 0.95 mg/dL). Liver Function Tests: No results for input(s): AST, ALT, ALKPHOS, BILITOT, PROT, ALBUMIN in the last 168 hours. No results for input(s): LIPASE, AMYLASE in the last 168 hours. No results for input(s): AMMONIA in the last 168 hours. Coagulation Profile:  Recent Labs Lab 08/28/16 0436  INR 1.13   Cardiac Enzymes:  Recent Labs Lab 08/22/16 0218 08/22/16 0827 08/22/16 1527  TROPONINI 0.06* <0.03 <0.03   BNP (last 3 results) No results for input(s): PROBNP in the last 8760 hours. HbA1C: No results for input(s): HGBA1C in the last 72 hours. CBG: No results for input(s): GLUCAP in the last 168 hours. Lipid Profile: No results for input(s): CHOL, HDL, LDLCALC, TRIG, CHOLHDL, LDLDIRECT in the last 72 hours. Thyroid Function Tests: No results for input(s): TSH, T4TOTAL, FREET4, T3FREE, THYROIDAB in the last 72 hours. Anemia Panel: No  results for input(s): VITAMINB12, FOLATE, FERRITIN, TIBC, IRON, RETICCTPCT in the last 72 hours. Sepsis Labs:  Recent Labs Lab 08/21/16 2250 08/22/16 0038 08/28/16 0436  PROCALCITON  --   --  0.40  LATICACIDVEN 2.39* 1.90  --     Recent Results (from the past 240 hour(s))  Blood Culture (routine x 2)     Status: None   Collection Time: 08/21/16 10:35 PM  Result Value Ref Range Status   Specimen Description BLOOD LEFT ANTECUBITAL  Final   Special Requests BOTTLES DRAWN AEROBIC AND ANAEROBIC 5CC  Final   Culture NO GROWTH 5 DAYS  Final   Report Status 08/26/2016 FINAL  Final  Blood Culture (routine x 2)     Status: None   Collection Time: 08/21/16 10:40 PM  Result Value Ref Range Status   Specimen Description BLOOD LEFT HAND  Final   Special Requests BOTTLES DRAWN AEROBIC AND ANAEROBIC 5CC  Final   Culture NO GROWTH 5 DAYS  Final   Report Status 08/26/2016 FINAL  Final  Urine culture     Status: None   Collection Time: 08/22/16  2:40 AM  Result Value Ref Range Status   Specimen Description URINE, RANDOM  Final   Special Requests NONE  Final   Culture NO GROWTH 1 DAY  Final   Report Status 08/23/2016 FINAL  Final     Radiology Studies: Dg Chest 2 View  Result Date: 08/27/2016 CLINICAL DATA:  Shortness of breath for 1 week.  Pneumonia. EXAM: CHEST  2 VIEW COMPARISON:  08/24/2016 chest radiograph. FINDINGS: Stable cardiomediastinal silhouette with top-normal heart size. No pneumothorax. Moderate right pleural effusion is increased. No left pleural effusion. There is worsening consolidation and volume loss in the mid to lower right lung. No consolidative airspace disease in the left lung. IMPRESSION: 1. Moderate right pleural effusion, increased. 2. Worsening consolidation and volume loss in the mid to lower right lung, likely combination of pneumonia and atelectasis. Recommend follow-up PA and lateral post treatment chest radiographs to document resolution. Electronically Signed    By: Delbert Phenix M.D.   On: 08/27/2016 08:26   Ct Chest Wo Contrast  Result Date: 08/28/2016 CLINICAL DATA:  Chest pain, shortness of breath, right-sided pleural effusion. EXAM: CT CHEST WITHOUT CONTRAST TECHNIQUE: Multidetector CT imaging of the chest was performed following the standard protocol without IV contrast. COMPARISON:  Radiograph of August 27, 2016. CT scan of August 21, 2016. FINDINGS: Cardiovascular: There is no evidence of thoracic aortic aneurysm. Normal cardiac size. Mediastinum/Nodes: Stable mildly enlarged mediastinal adenopathy is noted, which most likely is inflammatory or reactive in etiology. Lungs/Pleura: No pneumothorax is noted. Minimal left basilar subsegmental atelectasis is noted. Minimal left pleural effusion is noted. Large right pleural effusion is noted with adjacent subsegmental atelectasis of the right upper lobe and atelectasis of the right lower lobe. Focal loculated pleural effusion is seen anteriorly and medially adjacent to the right cardiac border. There is noted substantial thickening of the musculature of the right chest wall consistent with inflammation, with possible fluid collection forming in this area as well. Upper Abdomen: No definite abnormality seen in the visualized portion of the upper abdomen. Musculoskeletal: Minimally displaced fracture seen involving the anterior portion of right fifth rib, best seen on image number 87 of series 3. IMPRESSION: Minimally displaced fracture involving anterior portion of right fifth rib. Stable mildly enlarged mediastinal adenopathy is noted which most likely is inflammatory or reactive in etiology. Large right pleural effusion is noted which is significantly increased in size compared to prior exam. There is adjacent subsegmental atelectasis the right upper lobe been atelectasis of the right lower lobe. Focal loculated pleural effusion is seen anteriorly and medially in the right hemi thorax adjacent to the right cardiac  border. Substantial thickening of the musculature of the right chest wall is noted consistent with inflammation, with possible fluid collection forming in this area as well. Electronically Signed   By: Lupita Raider, M.D.   On: 08/28/2016 07:09    Scheduled Meds: . clindamycin  600 mg Oral TID  . DULoxetine  60 mg Oral Daily  . enoxaparin (LOVENOX) injection  60 mg Subcutaneous Q24H  . gabapentin  300 mg Oral TID  . nystatin  5 mL Oral QID  . sodium chloride flush  3 mL Intravenous Q12H   Continuous Infusions:     LOS: 7 days    Latrelle Dodrill, MD [facility]  Pager [phone]  If 7PM-7AM, please contact night-coverage [email or web address] Password [email or web address] 08/28/2016, 11:29 AM

## 2016-08-28 NOTE — Progress Notes (Signed)
Attempted ABG x2 and was unsuccessful. Will have another RT attempt to get ABG on patient.

## 2016-08-28 NOTE — Progress Notes (Signed)
Patient in bed no Distress. No IV Access. May need Central Vein access back IV antibiotics.

## 2016-08-28 NOTE — Progress Notes (Signed)
Name: Bryan CageJames Jennings MRN: [id number] DOB: 1991/05/02    ADMISSION DATE:  08/21/2016 CONSULTATION DATE:  08/27/2016  REFERRING MD :  Dr. Randel PiggSilva Zapata  CHIEF COMPLAINT:  Effusion  HISTORY OF PRESENT ILLNESS:  25 year old male with PMH of HTN and substance abuse including IVDA. Apparently he overdosed on heroin 10/13 and a bystander performed CPR. He woke up with EMS by narcan and declined hospitalization at that time. He felt as though he had cracked a rib and has had chest pain. He presented to the emergency department a few days later 10/18 with CC SOB and chest pain. He was admitted to the hospitalist team for sepsis/CAP. He was treated with CAP antibiotics. Unfortunately he did not have much improvement in his symptoms and WBC continued to rise. He was broadened to Zosyn. 10/23 CXR demonstrated large R pleural effusion. Pulmonary consulted for possible thora.  SIGNIFICANT EVENTS  10/13>> CPR/ Narcan for Heroin OD>> Refused hospitalization at time 10/18>> Admit for SOB/ CP>> Sepsis/ CAP  STUDIES:  CT chest 10/17 > Limited evaluation for pulmonary embolism due to respiratory motion artifact. No definite central pulmonary artery embolus identified. Small right pleural effusion with right lung base atelectasis versus pneumonia. Small patchy left lung base density may represent atelectatic changes versus infiltrate.  CT chest 10/23 >>>Minimally displaced fracture  anterior portion of right fifth rib.Stable mildly enlarged mediastinal adenopathy  Most likely is inflammatory or reactive in etiology.Large right pleural effusion  is significantly increased in size compared to prior exam. There is adjacent subsegmental atelectasis the right upper lobe and   right lower lobe. Focal loculated pleural effusion is seen anteriorly and medially in the right hemi thorax adjacent to the right cardiac border. Substantial thickening of the musculature of the right chest wall is noted consistent with  inflammation, with possible fluid collection forming in this area as well.   SOCIAL HISTORY:  reports that he has been smoking Cigarettes.  He has a 12.00 pack-year smoking history. He has never used smokeless tobacco. He reports that he drinks alcohol. He reports that he uses drugs, including Cocaine, Heroin, and Benzodiazepines.   SUBJECTIVE:   VITAL SIGNS: Temp:  [98.9 F (37.2 C)-100.1 F (37.8 C)] 99 F (37.2 C) (10/24 0534) Pulse Rate:  [95-108] 108 (10/24 0534) Resp:  [18-19] 18 (10/24 0534) BP: (124-133)/(58-80) 124/59 (10/24 0534) SpO2:  [94 %-96 %] 96 % (10/24 0534) Weight:  [258 lb 12.8 oz (117.4 kg)] 258 lb 12.8 oz (117.4 kg) (10/24 0534)  PHYSICAL EXAMINATION: General:  Obese male in NAD on 2L [place], anxious Neuro:  Alert, oriented, non-focal HEENT:  [place]/AT, PERRL, no JVD Cardiovascular:  RRR, no MRG Lungs:  R diminished whole way up back. Shallow rapid respirations due to pleuritic pain. Expiratory wheezing ( VCD) Abdomen:  Soft, non-tender Musculoskeletal:  No acute deformity or ROM limitation Skin:  Grossly intact   Recent Labs Lab 08/26/16 0328 08/27/16 0403 08/28/16 0436  NA 136 136 136  K 3.9 3.6 3.9  CL 102 101 100*  CO2 25 24 27   BUN 16 14 13   CREATININE 0.68 0.75 0.95  GLUCOSE 95 78 84    Recent Labs Lab 08/26/16 0328 08/27/16 0403 08/28/16 0436  HGB 12.3* 11.7* 11.9*  HCT 36.7* 34.8* 36.4*  WBC 18.0* 24.6* 24.1*  PLT 403* 368 364   Dg Chest 2 View  Result Date: 08/27/2016 CLINICAL DATA:  Shortness of breath for 1 week.  Pneumonia. EXAM: CHEST  2 VIEW COMPARISON:  08/24/2016 chest radiograph. FINDINGS: Stable cardiomediastinal silhouette with top-normal heart size. No pneumothorax. Moderate right pleural effusion is increased. No left pleural effusion. There is worsening consolidation and volume loss in the mid to lower right lung. No consolidative airspace disease in the left lung. IMPRESSION: 1. Moderate right pleural effusion, increased.  2. Worsening consolidation and volume loss in the mid to lower right lung, likely combination of pneumonia and atelectasis. Recommend follow-up PA and lateral post treatment chest radiographs to document resolution. Electronically Signed   By: Delbert Phenix M.D.   On: 08/27/2016 08:26   Ct Chest Wo Contrast  Result Date: 08/28/2016 CLINICAL DATA:  Chest pain, shortness of breath, right-sided pleural effusion. EXAM: CT CHEST WITHOUT CONTRAST TECHNIQUE: Multidetector CT imaging of the chest was performed following the standard protocol without IV contrast. COMPARISON:  Radiograph of August 27, 2016. CT scan of August 21, 2016. FINDINGS: Cardiovascular: There is no evidence of thoracic aortic aneurysm. Normal cardiac size. Mediastinum/Nodes: Stable mildly enlarged mediastinal adenopathy is noted, which most likely is inflammatory or reactive in etiology. Lungs/Pleura: No pneumothorax is noted. Minimal left basilar subsegmental atelectasis is noted. Minimal left pleural effusion is noted. Large right pleural effusion is noted with adjacent subsegmental atelectasis of the right upper lobe and atelectasis of the right lower lobe. Focal loculated pleural effusion is seen anteriorly and medially adjacent to the right cardiac border. There is noted substantial thickening of the musculature of the right chest wall consistent with inflammation, with possible fluid collection forming in this area as well. Upper Abdomen: No definite abnormality seen in the visualized portion of the upper abdomen. Musculoskeletal: Minimally displaced fracture seen involving the anterior portion of right fifth rib, best seen on image number 87 of series 3. IMPRESSION: Minimally displaced fracture involving anterior portion of right fifth rib. Stable mildly enlarged mediastinal adenopathy is noted which most likely is inflammatory or reactive in etiology. Large right pleural effusion is noted which is significantly increased in size compared  to prior exam. There is adjacent subsegmental atelectasis the right upper lobe been atelectasis of the right lower lobe. Focal loculated pleural effusion is seen anteriorly and medially in the right hemi thorax adjacent to the right cardiac border. Substantial thickening of the musculature of the right chest wall is noted consistent with inflammation, with possible fluid collection forming in this area as well. Electronically Signed   By: Lupita Raider, M.D.   On: 08/28/2016 07:09    ASSESSMENT / PLAN:  Large R loculated pleural effusion - Concern or hemothorax vs parapneumonic effusion based on history ( CPR) and onset of effusion. It was small on CT at time of admission, now grown to be quite large over past 23-48 hours. Repeat CT confirms increase in size and loculated.  - Will defer to CVTS for Chest tube placement vs. VATS  ( Dr. Edward Jolly notified by phone@ 10:35)    CAP secondary to suspected aspiration PNA  Continued Leukocytosis  T Max 100.1  - Continue ABX per primary (broadened to Zosyn 10/20) - All Cultures negative thus far - Re culture for temp >101.5 - Assess and trend procalcitonin to assist with antibiotic stewardship. - Follow WBC and fever curve  Shortness of breath Saturations 96% on 2 L [place] Exp. Wheezing upper airway ( VCD/ anxiety)  -Nebs prn as ordered by Primary -Oxygen for saturations >93%   Bevelyn Ngo AGACNP-BC [facility]  [phone]  08/28/2016 10:09 AM

## 2016-08-28 NOTE — Progress Notes (Signed)
  Subjective: Patient examined and CT chest personally reviewed and counseled with patient Loculated R empyema/hemothorax Will need VATS Scheduled for Wed   Objective: Vital signs in last 24 hours: Temp:  [99 F (37.2 C)-100.1 F (37.8 C)] 99.3 F (37.4 C) (10/24 1150) Pulse Rate:  [95-108] 100 (10/24 1150) Resp:  [18-19] 18 (10/24 1150) BP: (124-142)/(58-74) 142/74 (10/24 1150) SpO2:  [91 %-96 %] 95 % (10/24 1150) Weight:  [258 lb 12.8 oz (117.4 kg)] 258 lb 12.8 oz (117.4 kg) (10/24 0534)  Hemodynamic parameters for last 24 hours:  tachypneic  Intake/Output from previous day: 10/23 0701 - 10/24 0700 In: 1200 [P.O.:1200] Out: 100 [Urine:100] Intake/Output this shift: No intake/output data recorded.      Physical Exam  General: obese 25 yo male HEENT: Normocephalic pupils equal , dentition adequate Neck: Supple without JVD, adenopathy, or bruit Chest: reduced breath sounds on right but no tenderness             or deformity Cardiovascular: Regular rate and rhythm, no murmur, no gallop, peripheral pulses             palpable in all extremities Abdomen:  Soft, nontender, no palpable mass or organomegaly Extremities: Warm, well-perfused, no clubbing cyanosis edema or tenderness,              no venous stasis changes of the legs Rectal/GU: Deferred Neuro: Grossly non--focal and symmetrical throughout Skin: Clean and dry without rash or ulceration   Lab Results:  Recent Labs  08/27/16 0403 08/28/16 0436  WBC 24.6* 24.1*  HGB 11.7* 11.9*  HCT 34.8* 36.4*  PLT 368 364   BMET:  Recent Labs  08/27/16 0403 08/28/16 0436  NA 136 136  K 3.6 3.9  CL 101 100*  CO2 24 27  GLUCOSE 78 84  BUN 14 13  CREATININE 0.75 0.95  CALCIUM 8.1* 8.0*    PT/INR:  Recent Labs  08/28/16 0436  LABPROT 14.6  INR 1.13   ABG No results found for: PHART, HCO3, TCO2, ACIDBASEDEF, O2SAT CBG (last 3)  No results for input(s): GLUCAP in the last 72  hours.  Assessment/Plan: S/P  Surgery tomorrow   LOS: 7 days    Bryan Jennings 08/28/2016

## 2016-08-29 ENCOUNTER — Inpatient Hospital Stay (HOSPITAL_COMMUNITY): Payer: Self-pay | Admitting: Anesthesiology

## 2016-08-29 ENCOUNTER — Inpatient Hospital Stay (HOSPITAL_COMMUNITY): Payer: Self-pay

## 2016-08-29 ENCOUNTER — Encounter (HOSPITAL_COMMUNITY): Payer: Self-pay | Admitting: Certified Registered Nurse Anesthetist

## 2016-08-29 ENCOUNTER — Encounter (HOSPITAL_COMMUNITY)
Admission: EM | Disposition: A | Discharge status: Home / Self Care | Payer: Self-pay | Source: Home / Self Care | Attending: Cardiothoracic Surgery

## 2016-08-29 DIAGNOSIS — F1910 Other psychoactive substance abuse, uncomplicated: Secondary | ICD-10-CM

## 2016-08-29 DIAGNOSIS — E871 Hypo-osmolality and hyponatremia: Secondary | ICD-10-CM

## 2016-08-29 DIAGNOSIS — E876 Hypokalemia: Secondary | ICD-10-CM

## 2016-08-29 DIAGNOSIS — J869 Pyothorax without fistula: Secondary | ICD-10-CM

## 2016-08-29 HISTORY — PX: VIDEO ASSISTED THORACOSCOPY (VATS)/EMPYEMA: SHX6172

## 2016-08-29 HISTORY — PX: THORACOTOMY: SHX5074

## 2016-08-29 LAB — POCT I-STAT 7, (LYTES, BLD GAS, ICA,H+H)
ACID-BASE DEFICIT: 2 mmol/L (ref 0.0–2.0)
BICARBONATE: 24.9 mmol/L (ref 20.0–28.0)
CALCIUM ION: 1.07 mmol/L — AB (ref 1.15–1.40)
HEMATOCRIT: 30 % — AB (ref 39.0–52.0)
HEMOGLOBIN: 10.2 g/dL — AB (ref 13.0–17.0)
O2 Saturation: 91 %
PH ART: 7.301 — AB (ref 7.350–7.450)
POTASSIUM: 4.3 mmol/L (ref 3.5–5.1)
SODIUM: 139 mmol/L (ref 135–145)
TCO2: 26 mmol/L (ref 0–100)
pCO2 arterial: 50.1 mmHg — ABNORMAL HIGH (ref 32.0–48.0)
pO2, Arterial: 66 mmHg — ABNORMAL LOW (ref 83.0–108.0)

## 2016-08-29 LAB — COMPREHENSIVE METABOLIC PANEL
ALT: 42 U/L (ref 17–63)
AST: 45 U/L — ABNORMAL HIGH (ref 15–41)
Albumin: 2 g/dL — ABNORMAL LOW (ref 3.5–5.0)
Alkaline Phosphatase: 64 U/L (ref 38–126)
Anion gap: 9 (ref 5–15)
BUN: 11 mg/dL (ref 6–20)
CO2: 25 mmol/L (ref 22–32)
Calcium: 8.3 mg/dL — ABNORMAL LOW (ref 8.9–10.3)
Chloride: 104 mmol/L (ref 101–111)
Creatinine, Ser: 0.85 mg/dL (ref 0.61–1.24)
GFR calc Af Amer: >60 mL/min (ref >60)
GFR calc non Af Amer: >60 mL/min (ref >60)
Glucose, Bld: 89 mg/dL (ref 65–99)
Potassium: 4.3 mmol/L (ref 3.5–5.1)
Sodium: 138 mmol/L (ref 135–145)
Total Bilirubin: 1 mg/dL (ref 0.3–1.2)
Total Protein: 6.2 g/dL — ABNORMAL LOW (ref 6.5–8.1)

## 2016-08-29 LAB — CBC
HCT: 31.8 % — ABNORMAL LOW (ref 39.0–52.0)
HEMOGLOBIN: 10.2 g/dL — AB (ref 13.0–17.0)
MCH: 28.6 pg (ref 26.0–34.0)
MCHC: 32.1 g/dL (ref 30.0–36.0)
MCV: 89.1 fL (ref 78.0–100.0)
Platelets: 302 10*3/uL (ref 150–400)
RBC: 3.57 MIL/uL — AB (ref 4.22–5.81)
RDW: 13 % (ref 11.5–15.5)
WBC: 18.9 10*3/uL — AB (ref 4.0–10.5)

## 2016-08-29 LAB — CBC WITH DIFFERENTIAL/PLATELET
Basophils Absolute: 0.1 10*3/uL (ref 0.0–0.1)
Basophils Relative: 0 %
EOS PCT: 1 %
Eosinophils Absolute: 0.3 10*3/uL (ref 0.0–0.7)
HEMATOCRIT: 33.3 % — AB (ref 39.0–52.0)
HEMOGLOBIN: 10.9 g/dL — AB (ref 13.0–17.0)
LYMPHS ABS: 1.8 10*3/uL (ref 0.7–4.0)
LYMPHS PCT: 9 %
MCH: 28.9 pg (ref 26.0–34.0)
MCHC: 32.7 g/dL (ref 30.0–36.0)
MCV: 88.3 fL (ref 78.0–100.0)
Monocytes Absolute: 1.5 10*3/uL — ABNORMAL HIGH (ref 0.1–1.0)
Monocytes Relative: 7 %
Neutro Abs: 17.4 10*3/uL — ABNORMAL HIGH (ref 1.7–7.7)
Neutrophils Relative %: 83 %
Platelets: 314 10*3/uL (ref 150–400)
RBC: 3.77 MIL/uL — AB (ref 4.22–5.81)
RDW: 13 % (ref 11.5–15.5)
WBC: 21 10*3/uL — AB (ref 4.0–10.5)

## 2016-08-29 LAB — POCT I-STAT 3, ART BLOOD GAS (G3+)
ACID-BASE EXCESS: 1 mmol/L (ref 0.0–2.0)
ACID-BASE EXCESS: 2 mmol/L (ref 0.0–2.0)
BICARBONATE: 27.9 mmol/L (ref 20.0–28.0)
BICARBONATE: 26.6 mmol/L (ref 20.0–28.0)
O2 Saturation: 96 %
O2 Saturation: 99 %
TCO2: 30 mmol/L (ref 0–100)
TCO2: 28 mmol/L (ref 0–100)
pCO2 arterial: 53.3 mmHg — ABNORMAL HIGH (ref 32.0–48.0)
pCO2 arterial: 42.6 mmHg (ref 32.0–48.0)
pH, Arterial: 7.327 — ABNORMAL LOW (ref 7.350–7.450)
pH, Arterial: 7.402 (ref 7.350–7.450)
pO2, Arterial: 89 mmHg (ref 83.0–108.0)
pO2, Arterial: 115 mmHg — ABNORMAL HIGH (ref 83.0–108.0)

## 2016-08-29 LAB — GLUCOSE, CAPILLARY: GLUCOSE-CAPILLARY: 100 mg/dL — AB (ref 65–99)

## 2016-08-29 LAB — APTT: aPTT: 30 seconds (ref 24–36)

## 2016-08-29 LAB — PROCALCITONIN: PROCALCITONIN: 0.29 ng/mL

## 2016-08-29 LAB — URINALYSIS, ROUTINE W REFLEX MICROSCOPIC
Bilirubin Urine: NEGATIVE
Glucose, UA: NEGATIVE mg/dL
Hgb urine dipstick: NEGATIVE
Ketones, ur: NEGATIVE mg/dL
Leukocytes, UA: NEGATIVE
Nitrite: NEGATIVE
Protein, ur: NEGATIVE mg/dL
Specific Gravity, Urine: 1.028 (ref 1.005–1.030)
pH: 5.5 (ref 5.0–8.0)

## 2016-08-29 LAB — SURGICAL PCR SCREEN
MRSA, PCR: NEGATIVE
Staphylococcus aureus: NEGATIVE

## 2016-08-29 SURGERY — VIDEO ASSISTED THORACOSCOPY (VATS)/EMPYEMA
Anesthesia: General | Site: Chest | Laterality: Right

## 2016-08-29 MED ORDER — ENOXAPARIN SODIUM 40 MG/0.4ML ~~LOC~~ SOLN
40.0000 mg | SUBCUTANEOUS | Status: DC
  Administered 2016-08-30 – 2016-09-06 (×8): 40 mg via SUBCUTANEOUS
  Filled 2016-08-29 (×8): qty 0.4

## 2016-08-29 MED ORDER — LACTATED RINGERS IV SOLN
INTRAVENOUS | Status: DC | PRN
  Administered 2016-08-29: 10:00:00 via INTRAVENOUS

## 2016-08-29 MED ORDER — FENTANYL CITRATE (PF) 100 MCG/2ML IJ SOLN
INTRAMUSCULAR | Status: AC
  Filled 2016-08-29: qty 2

## 2016-08-29 MED ORDER — DEXMEDETOMIDINE HCL IN NACL 400 MCG/100ML IV SOLN
0.0000 ug/kg/h | INTRAVENOUS | Status: DC
  Administered 2016-08-29: 0.7 ug/kg/h via INTRAVENOUS
  Filled 2016-08-29: qty 50

## 2016-08-29 MED ORDER — ENOXAPARIN SODIUM 60 MG/0.6ML ~~LOC~~ SOLN: 60.0000 mg | SUBCUTANEOUS | Status: DC

## 2016-08-29 MED ORDER — 0.9 % SODIUM CHLORIDE (POUR BTL) OPTIME
TOPICAL | Status: DC | PRN
  Administered 2016-08-29: 1000 mL

## 2016-08-29 MED ORDER — HEMOSTATIC AGENTS (NO CHARGE) OPTIME
TOPICAL | Status: DC | PRN
  Administered 2016-08-29: 1 via TOPICAL

## 2016-08-29 MED ORDER — CHLORHEXIDINE GLUCONATE 0.12% ORAL RINSE (MEDLINE KIT)
15.0000 mL | Freq: Two times a day (BID) | OROMUCOSAL | Status: DC
  Administered 2016-08-29 – 2016-08-30 (×2): 15 mL via OROMUCOSAL

## 2016-08-29 MED ORDER — ACETAMINOPHEN 500 MG PO TABS
1000.0000 mg | ORAL_TABLET | Freq: Four times a day (QID) | ORAL | Status: DC
  Administered 2016-08-30 – 2016-09-03 (×15): 1000 mg via ORAL
  Filled 2016-08-29 (×14): qty 2

## 2016-08-29 MED ORDER — BUPIVACAINE HCL (PF) 0.5 % IJ SOLN
INTRAMUSCULAR | Status: AC
  Filled 2016-08-29: qty 30

## 2016-08-29 MED ORDER — SUCCINYLCHOLINE CHLORIDE 200 MG/10ML IV SOSY
PREFILLED_SYRINGE | INTRAVENOUS | Status: AC
  Filled 2016-08-29: qty 10

## 2016-08-29 MED ORDER — KCL IN DEXTROSE-NACL 20-5-0.45 MEQ/L-%-% IV SOLN
INTRAVENOUS | Status: DC
  Administered 2016-08-29: 15:00:00 via INTRAVENOUS
  Filled 2016-08-29 (×3): qty 1000

## 2016-08-29 MED ORDER — FENTANYL 2500MCG IN NS 250ML (10MCG/ML) PREMIX INFUSION
25.0000 ug/h | INTRAVENOUS | Status: DC
  Administered 2016-08-29: 100 ug/h via INTRAVENOUS
  Administered 2016-08-30: 150 ug/h via INTRAVENOUS
  Filled 2016-08-29 (×2): qty 250

## 2016-08-29 MED ORDER — ORAL CARE MOUTH RINSE
15.0000 mL | OROMUCOSAL | Status: DC
  Administered 2016-08-29 – 2016-08-30 (×6): 15 mL via OROMUCOSAL

## 2016-08-29 MED ORDER — HYDROMORPHONE HCL 1 MG/ML IJ SOLN
INTRAMUSCULAR | Status: AC
  Filled 2016-08-29: qty 1

## 2016-08-29 MED ORDER — METOCLOPRAMIDE HCL 5 MG/ML IJ SOLN
10.0000 mg | Freq: Four times a day (QID) | INTRAMUSCULAR | Status: DC
  Administered 2016-08-29: 10 mg via INTRAVENOUS
  Filled 2016-08-29: qty 2

## 2016-08-29 MED ORDER — MIDAZOLAM HCL 2 MG/2ML IJ SOLN: 2.0000 mg | INTRAMUSCULAR | Status: DC | PRN

## 2016-08-29 MED ORDER — FENTANYL CITRATE (PF) 250 MCG/5ML IJ SOLN
INTRAMUSCULAR | Status: AC
  Filled 2016-08-29: qty 5

## 2016-08-29 MED ORDER — BUPIVACAINE 0.5 % ON-Q PUMP SINGLE CATH 400 ML
400.0000 mL | INJECTION | Status: DC
  Filled 2016-08-29: qty 400

## 2016-08-29 MED ORDER — DULOXETINE HCL 60 MG PO CPEP
60.0000 mg | ORAL_CAPSULE | Freq: Every day | ORAL | Status: DC
  Administered 2016-08-30 – 2016-09-06 (×8): 60 mg via ORAL
  Filled 2016-08-29 (×8): qty 1

## 2016-08-29 MED ORDER — BUPIVACAINE HCL (PF) 0.5 % IJ SOLN
INTRAMUSCULAR | Status: DC | PRN
  Administered 2016-08-29: 10 mL

## 2016-08-29 MED ORDER — MIDAZOLAM HCL 2 MG/2ML IJ SOLN
INTRAMUSCULAR | Status: AC
  Filled 2016-08-29: qty 2

## 2016-08-29 MED ORDER — ACETAMINOPHEN 160 MG/5ML PO SOLN
1000.0000 mg | Freq: Four times a day (QID) | ORAL | Status: DC
  Administered 2016-08-29 – 2016-08-30 (×3): 1000 mg via ORAL
  Filled 2016-08-29 (×4): qty 40.6

## 2016-08-29 MED ORDER — SENNOSIDES-DOCUSATE SODIUM 8.6-50 MG PO TABS: 1.0000 | ORAL_TABLET | Freq: Every day | ORAL | Status: DC

## 2016-08-29 MED ORDER — FENTANYL CITRATE (PF) 100 MCG/2ML IJ SOLN
INTRAMUSCULAR | Status: DC | PRN
  Administered 2016-08-29: 150 ug via INTRAVENOUS
  Administered 2016-08-29 (×2): 100 ug via INTRAVENOUS

## 2016-08-29 MED ORDER — FENTANYL CITRATE (PF) 100 MCG/2ML IJ SOLN: 100.0000 ug | INTRAMUSCULAR | Status: DC | PRN

## 2016-08-29 MED ORDER — BUPIVACAINE HCL (PF) 0.5 % IJ SOLN
INTRAMUSCULAR | Status: AC
  Filled 2016-08-29: qty 10

## 2016-08-29 MED ORDER — GABAPENTIN 600 MG PO TABS
300.0000 mg | ORAL_TABLET | Freq: Three times a day (TID) | ORAL | Status: DC
  Administered 2016-08-30 – 2016-09-07 (×26): 300 mg
  Filled 2016-08-29 (×26): qty 1

## 2016-08-29 MED ORDER — BISACODYL 5 MG PO TBEC: 10.0000 mg | DELAYED_RELEASE_TABLET | Freq: Every day | ORAL | Status: DC

## 2016-08-29 MED ORDER — SODIUM CHLORIDE 0.9 % IV SOLN
2000.0000 mg | Freq: Once | INTRAVENOUS | Status: AC
  Administered 2016-08-29: 2000 mg via INTRAVENOUS
  Filled 2016-08-29: qty 2000

## 2016-08-29 MED ORDER — PROPOFOL 1000 MG/100ML IV EMUL
5.0000 ug/kg/min | INTRAVENOUS | Status: DC
  Administered 2016-08-29: 50 ug/kg/min via INTRAVENOUS
  Administered 2016-08-29: 40 ug/kg/min via INTRAVENOUS
  Administered 2016-08-29: 30 ug/kg/min via INTRAVENOUS
  Administered 2016-08-30: 20 ug/kg/min via INTRAVENOUS
  Filled 2016-08-29 (×4): qty 100

## 2016-08-29 MED ORDER — PROPOFOL 10 MG/ML IV BOLUS
INTRAVENOUS | Status: AC
  Filled 2016-08-29: qty 20

## 2016-08-29 MED ORDER — POTASSIUM CHLORIDE 10 MEQ/50ML IV SOLN: 10.0000 meq | Freq: Every day | INTRAVENOUS | Status: DC | PRN

## 2016-08-29 MED ORDER — FAMOTIDINE IN NACL 20-0.9 MG/50ML-% IV SOLN: 20.0000 mg | Freq: Two times a day (BID) | INTRAVENOUS | Status: DC

## 2016-08-29 MED ORDER — BUPIVACAINE ON-Q PAIN PUMP (FOR ORDER SET NO CHG)
INJECTION | Status: AC
  Filled 2016-08-29: qty 1

## 2016-08-29 MED ORDER — GABAPENTIN 600 MG PO TABS: 300.0000 mg | ORAL_TABLET | Freq: Three times a day (TID) | ORAL | Status: DC

## 2016-08-29 MED ORDER — ONDANSETRON HCL 4 MG/2ML IJ SOLN: 4.0000 mg | Freq: Four times a day (QID) | INTRAMUSCULAR | Status: DC | PRN

## 2016-08-29 MED ORDER — HYDROMORPHONE HCL 1 MG/ML IJ SOLN
INTRAMUSCULAR | Status: DC | PRN
  Administered 2016-08-29: 1 mg via INTRAVENOUS

## 2016-08-29 MED ORDER — PHENYLEPHRINE HCL 10 MG/ML IJ SOLN
INTRAVENOUS | Status: DC | PRN
  Administered 2016-08-29: 40 ug/min via INTRAVENOUS

## 2016-08-29 MED ORDER — VANCOMYCIN HCL IN DEXTROSE 1-5 GM/200ML-% IV SOLN
1000.0000 mg | Freq: Two times a day (BID) | INTRAVENOUS | Status: DC
  Filled 2016-08-29: qty 200

## 2016-08-29 MED ORDER — PHENYLEPHRINE 40 MCG/ML (10ML) SYRINGE FOR IV PUSH (FOR BLOOD PRESSURE SUPPORT)
PREFILLED_SYRINGE | INTRAVENOUS | Status: DC | PRN
  Administered 2016-08-29: 180 ug via INTRAVENOUS

## 2016-08-29 MED ORDER — MIDAZOLAM HCL 2 MG/2ML IJ SOLN: 1.0000 mg | INTRAMUSCULAR | Status: DC | PRN

## 2016-08-29 MED ORDER — ONDANSETRON HCL 4 MG/2ML IJ SOLN
INTRAMUSCULAR | Status: AC
  Filled 2016-08-29: qty 2

## 2016-08-29 MED ORDER — SODIUM CHLORIDE 0.9 % IV SOLN
INTRAVENOUS | Status: DC
  Administered 2016-08-29: 17:00:00 via INTRAVENOUS
  Administered 2016-08-30: 10 mL/h via INTRAVENOUS
  Administered 2016-08-30: 06:00:00 via INTRAVENOUS

## 2016-08-29 MED ORDER — LIDOCAINE HCL (CARDIAC) 20 MG/ML IV SOLN
INTRAVENOUS | Status: DC | PRN
  Administered 2016-08-29: 100 mg via INTRAVENOUS

## 2016-08-29 MED ORDER — PROPOFOL 10 MG/ML IV BOLUS
INTRAVENOUS | Status: DC | PRN
  Administered 2016-08-29: 200 mg via INTRAVENOUS

## 2016-08-29 MED ORDER — FAMOTIDINE IN NACL 20-0.9 MG/50ML-% IV SOLN
20.0000 mg | Freq: Two times a day (BID) | INTRAVENOUS | Status: DC
Start: 2016-08-29 — End: 2016-08-31
  Administered 2016-08-29 – 2016-08-31 (×5): 20 mg via INTRAVENOUS
  Filled 2016-08-29 (×5): qty 50

## 2016-08-29 MED ORDER — MIDAZOLAM HCL 5 MG/5ML IJ SOLN
INTRAMUSCULAR | Status: DC | PRN
  Administered 2016-08-29: 2 mg via INTRAVENOUS

## 2016-08-29 MED ORDER — ALBUMIN HUMAN 5 % IV SOLN
INTRAVENOUS | Status: DC | PRN
  Administered 2016-08-29 (×3): via INTRAVENOUS

## 2016-08-29 MED ORDER — EPHEDRINE 5 MG/ML INJ
INTRAVENOUS | Status: AC
  Filled 2016-08-29: qty 10

## 2016-08-29 MED ORDER — VANCOMYCIN HCL IN DEXTROSE 1-5 GM/200ML-% IV SOLN
1000.0000 mg | Freq: Three times a day (TID) | INTRAVENOUS | Status: DC
  Administered 2016-08-29 – 2016-09-01 (×9): 1000 mg via INTRAVENOUS
  Filled 2016-08-29 (×11): qty 200

## 2016-08-29 MED ORDER — SENNOSIDES 8.8 MG/5ML PO SYRP
5.0000 mL | ORAL_SOLUTION | Freq: Two times a day (BID) | ORAL | Status: DC
  Administered 2016-08-29 – 2016-08-30 (×3): 5 mL
  Filled 2016-08-29 (×3): qty 5

## 2016-08-29 MED ORDER — FENTANYL BOLUS VIA INFUSION
25.0000 ug | INTRAVENOUS | Status: DC | PRN
  Filled 2016-08-29: qty 100

## 2016-08-29 MED ORDER — BUPIVACAINE 0.5 % ON-Q PUMP SINGLE CATH 400 ML
INJECTION | Status: DC | PRN
  Administered 2016-08-29: 400 mL

## 2016-08-29 MED ORDER — ROCURONIUM BROMIDE 100 MG/10ML IV SOLN
INTRAVENOUS | Status: DC | PRN
  Administered 2016-08-29: 20 mg via INTRAVENOUS
  Administered 2016-08-29: 60 mg via INTRAVENOUS
  Administered 2016-08-29 (×2): 20 mg via INTRAVENOUS
  Administered 2016-08-29: 30 mg via INTRAVENOUS

## 2016-08-29 SURGICAL SUPPLY — 75 items
BAG DECANTER FOR FLEXI CONT (MISCELLANEOUS) IMPLANT
BLADE SURG 11 STRL SS (BLADE) ×3 IMPLANT
CANISTER SUCTION 2500CC (MISCELLANEOUS) ×3 IMPLANT
CATH KIT ON Q 5IN SLV (PAIN MANAGEMENT) IMPLANT
CATH KIT ON-Q SILVERSOAK 5IN (CATHETERS) ×3 IMPLANT
CATH ROBINSON RED A/P 22FR (CATHETERS) IMPLANT
CATH THORACIC 28FR (CATHETERS) IMPLANT
CATH THORACIC 36FR (CATHETERS) IMPLANT
CATH THORACIC 36FR RT ANG (CATHETERS) ×3 IMPLANT
CLIP TI MEDIUM 24 (CLIP) ×3 IMPLANT
CONN ST 1/4X3/8  BEN (MISCELLANEOUS) ×2
CONN ST 1/4X3/8 BEN (MISCELLANEOUS) ×1 IMPLANT
CONN Y 3/8X3/8X3/8  BEN (MISCELLANEOUS) ×4
CONN Y 3/8X3/8X3/8 BEN (MISCELLANEOUS) ×2 IMPLANT
CONT SPEC 4OZ CLIKSEAL STRL BL (MISCELLANEOUS) ×15 IMPLANT
DERMABOND ADVANCED (GAUZE/BANDAGES/DRESSINGS)
DERMABOND ADVANCED .7 DNX12 (GAUZE/BANDAGES/DRESSINGS) IMPLANT
DRAIN CHANNEL 32F RND 10.7 FF (WOUND CARE) ×3 IMPLANT
DRAIN WOUND SNY 15 RND (WOUND CARE) ×3 IMPLANT
DRAPE INCISE IOBAN 66X45 STRL (DRAPES) ×3 IMPLANT
DRAPE LAPAROSCOPIC ABDOMINAL (DRAPES) ×3 IMPLANT
DRAPE WARM FLUID 44X44 (DRAPE) ×3 IMPLANT
ELECT BLADE 4.0 EZ CLEAN MEGAD (MISCELLANEOUS) ×3
ELECT REM PT RETURN 9FT ADLT (ELECTROSURGICAL) ×3
ELECTRODE BLDE 4.0 EZ CLN MEGD (MISCELLANEOUS) ×1 IMPLANT
ELECTRODE REM PT RTRN 9FT ADLT (ELECTROSURGICAL) ×1 IMPLANT
EVACUATOR SILICONE 100CC (DRAIN) ×3 IMPLANT
GAUZE SPONGE 4X4 12PLY STRL (GAUZE/BANDAGES/DRESSINGS) ×3 IMPLANT
GOWN STRL REUS W/ TWL LRG LVL3 (GOWN DISPOSABLE) ×3 IMPLANT
GOWN STRL REUS W/TWL LRG LVL3 (GOWN DISPOSABLE) ×6
KIT BASIN OR (CUSTOM PROCEDURE TRAY) ×3 IMPLANT
KIT ROOM TURNOVER OR (KITS) ×3 IMPLANT
KIT SUCTION CATH 14FR (SUCTIONS) ×6 IMPLANT
NS IRRIG 1000ML POUR BTL (IV SOLUTION) ×12 IMPLANT
PACK CHEST (CUSTOM PROCEDURE TRAY) ×3 IMPLANT
PAD ARMBOARD 7.5X6 YLW CONV (MISCELLANEOUS) ×6 IMPLANT
SEALANT SURG COSEAL 4ML (VASCULAR PRODUCTS) IMPLANT
SOLUTION ANTI FOG 6CC (MISCELLANEOUS) ×6 IMPLANT
SPONGE GAUZE 4X4 12PLY STER LF (GAUZE/BANDAGES/DRESSINGS) ×3 IMPLANT
SPONGE LAP 18X18 X RAY DECT (DISPOSABLE) ×9 IMPLANT
SPONGE TONSIL 1.25 RF SGL STRG (GAUZE/BANDAGES/DRESSINGS) ×9 IMPLANT
SUT BONE WAX W31G (SUTURE) ×3 IMPLANT
SUT CHROMIC 3 0 SH 27 (SUTURE) IMPLANT
SUT ETHILON 3 0 PS 1 (SUTURE) IMPLANT
SUT PROLENE 3 0 SH DA (SUTURE) IMPLANT
SUT PROLENE 4 0 RB 1 (SUTURE)
SUT PROLENE 4-0 RB1 .5 CRCL 36 (SUTURE) IMPLANT
SUT PROLENE 6 0 C 1 30 (SUTURE) IMPLANT
SUT SILK  1 MH (SUTURE) ×6
SUT SILK 1 MH (SUTURE) ×3 IMPLANT
SUT SILK 1 TIES 10X30 (SUTURE) IMPLANT
SUT SILK 2 0 SH CR/8 (SUTURE) ×3 IMPLANT
SUT SILK 2 0SH CR/8 30 (SUTURE) IMPLANT
SUT SILK 3 0 SH CR/8 (SUTURE) ×3 IMPLANT
SUT SILK 3 0SH CR/8 30 (SUTURE) IMPLANT
SUT VIC AB 1 CTX 18 (SUTURE) ×6 IMPLANT
SUT VIC AB 2 TP1 27 (SUTURE) IMPLANT
SUT VIC AB 2-0 CT2 18 VCP726D (SUTURE) IMPLANT
SUT VIC AB 2-0 CTX 36 (SUTURE) ×3 IMPLANT
SUT VIC AB 3-0 SH 18 (SUTURE) IMPLANT
SUT VIC AB 3-0 X1 27 (SUTURE) ×3 IMPLANT
SUT VICRYL 0 UR6 27IN ABS (SUTURE) IMPLANT
SUT VICRYL 2 TP 1 (SUTURE) ×3 IMPLANT
SWAB COLLECTION DEVICE MRSA (MISCELLANEOUS) IMPLANT
SYSTEM SAHARA CHEST DRAIN ATS (WOUND CARE) ×3 IMPLANT
SYSTEM SAHARA CHEST DRAIN RE-I (WOUND CARE) ×3 IMPLANT
TAPE CLOTH SURG 4X10 WHT LF (GAUZE/BANDAGES/DRESSINGS) ×3 IMPLANT
TIP APPLICATOR SPRAY EXTEND 16 (VASCULAR PRODUCTS) IMPLANT
TOWEL OR 17X24 6PK STRL BLUE (TOWEL DISPOSABLE) ×3 IMPLANT
TOWEL OR 17X26 10 PK STRL BLUE (TOWEL DISPOSABLE) ×6 IMPLANT
TRAP SPECIMEN MUCOUS 40CC (MISCELLANEOUS) ×6 IMPLANT
TRAY FOLEY CATH 16FRSI W/METER (SET/KITS/TRAYS/PACK) ×3 IMPLANT
TUBE ANAEROBIC SPECIMEN COL (MISCELLANEOUS) IMPLANT
TUNNELER SHEATH ON-Q 11GX8 DSP (PAIN MANAGEMENT) ×3 IMPLANT
WATER STERILE IRR 1000ML POUR (IV SOLUTION) ×6 IMPLANT

## 2016-08-29 NOTE — Anesthesia Preprocedure Evaluation (Addendum)
Anesthesia Evaluation  Patient identified by MRN, date of birth, ID band Patient awake    Reviewed: Allergy & Precautions, H&P , NPO status , Patient's Chart, lab work & pertinent test results  History of Anesthesia Complications Negative for: history of anesthetic complications  Airway Mallampati: III  TM Distance: >3 FB Neck ROM: full    Dental  (+) Dental Advisory Given, Poor Dentition   Pulmonary pneumonia, unresolved, Current Smoker,   Having increased work of breathing and wheezing at baseline in preop   + wheezing      Cardiovascular hypertension, Normal cardiovascular exam Rhythm:regular Rate:Tachycardia     Neuro/Psych PSYCHIATRIC DISORDERS Depression negative neurological ROS     GI/Hepatic negative GI ROS, (+)     substance abuse  IV drug use,   Endo/Other  Morbid obesity  Renal/GU negative Renal ROS     Musculoskeletal   Abdominal   Peds  Hematology negative hematology ROS (+)   Anesthesia Other Findings Sepsis, electrolyte abnormalities with lung empyema, substance abuse with heroin  Reproductive/Obstetrics negative OB ROS                           Anesthesia Physical Anesthesia Plan  ASA: III  Anesthesia Plan: General   Post-op Pain Management:    Induction: Intravenous  Airway Management Planned: Double Lumen EBT  Additional Equipment: Arterial line and CVP  Intra-op Plan:   Post-operative Plan: Possible Post-op intubation/ventilation  Informed Consent: I have reviewed the patients History and Physical, chart, labs and discussed the procedure including the risks, benefits and alternatives for the proposed anesthesia with the patient or authorized representative who has indicated his/her understanding and acceptance.   Dental Advisory Given and Dental advisory given  Plan Discussed with: Anesthesiologist, CRNA and Surgeon  Anesthesia Plan Comments:         Anesthesia Quick Evaluation

## 2016-08-29 NOTE — Anesthesia Procedure Notes (Signed)
Procedure Name: Intubation Date/Time: 08/29/2016 1:06 PM Performed by: Rogelia BogaMUELLER, Tayra Dawe P Pre-anesthesia Checklist: Patient identified, Emergency Drugs available, Suction available, Patient being monitored and Timeout performed Patient Re-evaluated:Patient Re-evaluated prior to inductionOxygen Delivery Method: Circle system utilized Preoxygenation: Pre-oxygenation with 100% oxygen Intubation Type: IV induction Ventilation: Mask ventilation without difficulty and Oral airway inserted - appropriate to patient size Laryngoscope Size: Mac and 4 Grade View: Grade I Tube type: Oral Endobronchial tube: Double lumen EBT and EBT position confirmed by fiberoptic bronchoscope and 39 Fr Number of attempts: 1 Airway Equipment and Method: Stylet Placement Confirmation: ETT inserted through vocal cords under direct vision,  positive ETCO2 and breath sounds checked- equal and bilateral Tube secured with: Tape Dental Injury: Teeth and Oropharynx as per pre-operative assessment

## 2016-08-29 NOTE — Progress Notes (Signed)
Pharmacy Antibiotic Note  Bryan Jennings is a 25 y.o. male admitted on 08/21/2016 with empyema s/p R VATS on 10/24.  History of IVDA noted. Pharmacy has been consulted for vancomycin dosing. Also on Zosyn per MD. Afebrile, wbc down to 21, PCT 0.4>0.21. SCr stable 0.85, normalized CrCl>100.  No pre-op dose of vancomycin documented in procedure log or on MAR.  Plan: Vancomycin 2g IV x 1; then 1g IV q8h Zosyn 3.375g IV q8h per MD Monitor clinical progress, c/s, renal function, abx plan/LOT VT@SS    Height: 5\' 10"  (177.8 cm) Weight: 256 lb 14.4 oz (116.5 kg) (Scale B) IBW/kg (Calculated) : 73  Temp (24hrs), Avg:99 F (37.2 C), Min:98.9 F (37.2 C), Max:99.1 F (37.3 C)   Recent Labs Lab 08/26/16 0328 08/27/16 0403 08/28/16 0436 08/28/16 2112 08/29/16 0802 08/29/16 0930  WBC 18.0* 24.6* 24.1* 23.5*  --  21.0*  CREATININE 0.68 0.75 0.95 0.93 0.85  --     Estimated Creatinine Clearance: 169.9 mL/min (by C-G formula based on SCr of 0.85 mg/dL).    No Known Allergies  Antimicrobials this admission: 10/17 azithro >> 10/23 10/17 CTX >> 10/20 10/20 zosyn >>10/22, 10/24>> Clinda 10/22>>10/24 10/25 vanc>>  Dose adjustments this admission:   Microbiology results: 10/18 BCx: negf 10/18 UCx:  negf 10/25 mrsa pcr: neg 10/25 body fluid cx:   Bryan Jennings, PharmD, BCPS Clinical Pharmacist 08/29/2016 1:58 PM

## 2016-08-29 NOTE — Progress Notes (Signed)
Name: Bryan CageJames Jennings MRN: [id number] DOB: 11/07/1990    ADMISSION DATE:  08/21/2016 CONSULTATION DATE:  08/27/2016  REFERRING MD :  Dr. Randel PiggSilva Zapata  CHIEF COMPLAINT:  Effusion  HISTORY OF PRESENT ILLNESS:  25 year old male with PMH of HTN and substance abuse including IVDA. Apparently he overdosed on heroin 10/13 and a bystander performed CPR. He woke up with EMS by narcan and declined hospitalization at that time. He felt as though he had cracked a rib and has had chest pain. He presented to the emergency department a few days later 10/18 with CC SOB and chest pain. He was admitted to the hospitalist team for sepsis/CAP. He was treated with CAP antibiotics. Unfortunately he did not have much improvement in his symptoms and WBC continued to rise. He was broadened to Zosyn. 10/23 CXR demonstrated large R pleural effusion. Pulmonary consulted for possible thora.  SIGNIFICANT EVENTS  10/13>> CPR/ Narcan for Heroin OD>> Refused hospitalization at time 10/18>> Admit for SOB/ CP>> Sepsis/ CAP 10/25>> R VATS   STUDIES:  CT chest 10/17 > Limited evaluation for pulmonary embolism due to respiratory motion artifact. No definite central pulmonary artery embolus identified. Small right pleural effusion with right lung base atelectasis versus pneumonia. Small patchy left lung base density may represent atelectatic changes versus infiltrate.  CT chest 10/23 >>>Minimally displaced fracture  anterior portion of right fifth rib.Stable mildly enlarged mediastinal adenopathy  Most likely is inflammatory or reactive in etiology.Large right pleural effusion  is significantly increased in size compared to prior exam. There is adjacent subsegmental atelectasis the right upper lobe and   right lower lobe. Focal loculated pleural effusion is seen anteriorly and medially in the right hemi thorax adjacent to the right cardiac border. Substantial thickening of the musculature of the right chest wall is noted  consistent with inflammation, with possible fluid collection forming in this area as well.   SUBJECTIVE:  Post VATS  VITAL SIGNS: Temp:  [98.6 F (37 C)-99.1 F (37.3 C)] 98.6 F (37 C) (10/25 1353) Pulse Rate:  [94-109] 99 (10/25 1400) Resp:  [16-26] 26 (10/25 1400) BP: (111-150)/(57-77) 126/66 (10/25 1400) SpO2:  [95 %-100 %] 99 % (10/25 1400) Arterial Line BP: (88-147)/(52-66) 147/66 (10/25 1400) FiO2 (%):  [100 %] 100 % (10/25 1353) Weight:  [116.5 kg (256 lb 14.4 oz)] 116.5 kg (256 lb 14.4 oz) (10/25 0514)  PHYSICAL EXAMINATION: General:  Young male in NAD, sedated post VATS  Neuro:  Alert, oriented, non-focal HEENT:  [place]/AT, PERRL, no JVD Cardiovascular:  RRR, no MRG Lungs:  resps even non labored on vent, R chest tube with serosanguinous dng, diminished R base, few scattered rhonchi  Abdomen:  Soft, non-tender Musculoskeletal:  No acute deformity or ROM limitation Skin:  Grossly intact   Recent Labs Lab 08/28/16 0436 08/28/16 2112 08/29/16 0802 08/29/16 1230  NA 136 137 138 139  K 3.9 4.5 4.3 4.3  CL 100* 105 104  --   CO2 27 23 25   --   BUN 13 15 11   --   CREATININE 0.95 0.93 0.85  --   GLUCOSE 84 92 89  --     Recent Labs Lab 08/28/16 0436 08/28/16 2112 08/29/16 0930 08/29/16 1230  HGB 11.9* 11.9* 10.9* 10.2*  HCT 36.4* 36.6* 33.3* 30.0*  WBC 24.1* 23.5* 21.0*  --   PLT 364 356 314  --    Ct Chest Wo Contrast  Result Date: 08/28/2016 CLINICAL DATA:  Chest pain, shortness of breath,  right-sided pleural effusion. EXAM: CT CHEST WITHOUT CONTRAST TECHNIQUE: Multidetector CT imaging of the chest was performed following the standard protocol without IV contrast. COMPARISON:  Radiograph of August 27, 2016. CT scan of August 21, 2016. FINDINGS: Cardiovascular: There is no evidence of thoracic aortic aneurysm. Normal cardiac size. Mediastinum/Nodes: Stable mildly enlarged mediastinal adenopathy is noted, which most likely is inflammatory or reactive in  etiology. Lungs/Pleura: No pneumothorax is noted. Minimal left basilar subsegmental atelectasis is noted. Minimal left pleural effusion is noted. Large right pleural effusion is noted with adjacent subsegmental atelectasis of the right upper lobe and atelectasis of the right lower lobe. Focal loculated pleural effusion is seen anteriorly and medially adjacent to the right cardiac border. There is noted substantial thickening of the musculature of the right chest wall consistent with inflammation, with possible fluid collection forming in this area as well. Upper Abdomen: No definite abnormality seen in the visualized portion of the upper abdomen. Musculoskeletal: Minimally displaced fracture seen involving the anterior portion of right fifth rib, best seen on image number 87 of series 3. IMPRESSION: Minimally displaced fracture involving anterior portion of right fifth rib. Stable mildly enlarged mediastinal adenopathy is noted which most likely is inflammatory or reactive in etiology. Large right pleural effusion is noted which is significantly increased in size compared to prior exam. There is adjacent subsegmental atelectasis the right upper lobe been atelectasis of the right lower lobe. Focal loculated pleural effusion is seen anteriorly and medially in the right hemi thorax adjacent to the right cardiac border. Substantial thickening of the musculature of the right chest wall is noted consistent with inflammation, with possible fluid collection forming in this area as well. Electronically Signed   By: Lupita Raider, M.D.   On: 08/28/2016 07:09    ASSESSMENT / PLAN:  25 y.o. male with history of substance and IV drug use with large R empyema likely r/t rib fx in setting bystander CPR after drug OD.  Remains on vent post R VATS 10/25.    Right empyema s/p VATS 10/25--   PLAN -  abx as above  Continue vent support overnight  SBT in am  F/u CXR  Chest tube per CVTS  F/u culture  Propofol, fent gtt   WUA in am  Goal RASS -1   ?CAP --  PLAN -  Cont abx as above  F/u CXR  F/u CBC     Dirk Dress, NP 08/29/2016  2:40 PM Pager: [phone] or [phone]

## 2016-08-29 NOTE — Brief Op Note (Signed)
08/21/2016 - 08/29/2016  1:27 PM  PATIENT:  Bryan Jennings  25 y.o. male  PRE-OPERATIVE DIAGNOSIS:  Right Empyema  POST-OPERATIVE DIAGNOSIS:  Right Empyema  PROCEDURE:  Procedure(s): VIDEO ASSISTED THORACOSCOPY (VATS)/ DRAINAGE EMPYEMA (Right) THORACOTOMY MAJOR (Right)  SURGEON:  Surgeon(s) and Role:    * Kerin PernaPeter Van Trigt, MD - Primary  PHYSICIAN ASSISTANT:  Jari Favreessa Dexter Sauser, PA-C  ANESTHESIA:   general  EBL:  Total I/O In: 750 [IV Piggyback:750] Out: 800 [Urine:800]  BLOOD ADMINISTERED:none  DRAINS: routine   LOCAL MEDICATIONS USED:  NONE  SPECIMEN:  Source of Specimen:  pleural peel, pleural fluid  DISPOSITION OF SPECIMEN:  PATHOLOGY  COUNTS:  YES  TOURNIQUET:  * No tourniquets in log *  DICTATION: .Other Dictation: Dictation Number pending  PLAN OF CARE: Admit to inpatient   PATIENT DISPOSITION:  ICU - intubated and hemodynamically stable.   Delay start of Pharmacological VTE agent (>24hrs) due to surgical blood loss or risk of bleeding: yes

## 2016-08-29 NOTE — Anesthesia Procedure Notes (Signed)
Central Venous Catheter Insertion Performed by: anesthesiologist Patient location: Pre-op. Preanesthetic checklist: patient identified, IV checked, site marked, risks and benefits discussed, surgical consent, monitors and equipment checked, pre-op evaluation, timeout performed and anesthesia consent Position: Trendelenburg Lidocaine 1% used for infiltration Landmarks identified Catheter size: 8.5 Fr Central line was placed.Double lumen Procedure performed using ultrasound guided technique. Attempts: 1 Following insertion, dressing applied and line sutured. Post procedure assessment: blood return through all ports. Patient tolerated the procedure well with no immediate complications.

## 2016-08-29 NOTE — Progress Notes (Signed)
PROGRESS NOTE  Bryan Jennings ZOX:[name] DOB: 05/31/1991 DOA: 08/21/2016 PCP: No PCP Per Patient  Brief History:  24 y.o.malewith medical history significant of HTN and polysubstance abuse presenting with rib pain and SOB. Patient had an episode of heroin overdose, and reported CPR.  the patient was found unresponsive by a bystander and CPR was initiated. EMS was activated, and the patient woke up with Narcan but refused to go to the emergency department on 08/17/2016. Because of increasing pain shortness breath, the patient presented to emergency department on 08/21/2016.  Patient admitted for Sepsis 2/2 aspiration PNA treated with abx with poss opioid withdrawals. Patient was evaluated by psych for detox whom recommended outpatient therapy. During hospital stay patient as been up and down with his improvement. Patient was initially treated with Rocephin and Azithro (completed 5 days), Rocephin was switched to Zosyn, subsequently to Clindamycin when clinically improvement was noted. On hospital day 6 patient decompensated, having increase in WOB and pain, CXR repeated, CT showed rib fractures with increase in pleural effusion. CCM and TCTS consulted.  Assessment/Plan: Sepsis -Present at the time of admission -Secondary to aspiration pneumonia -Sepsis physiology improved -Continue IV antibiotics  Loculated right pleural effusion -Concern about hemothorax versus parapneumonic effusion -08/27/2016 CT chest--increased size of right pleural effusion with loculations -Appreciate Pulm  -appreciate Dr. Donata ClayVan Trigt -08/29/16--VATS-->follow intra-op cultures -continue zosyn -Blood and urine cultures negative -O2 supplement as needed -strep pneumo negative  -Flutter Valve  -Albuterol PRN\  Acute respiratory failure with hypoxia -secondary to pneumonia and pleural effusion -stable on 2 L [place] -upper airway wheeze likely due to VCD vs anxiety  5th Rib fracture minimally displaced    -Pain med - robaxin, ultram, Toradol  -Avoid escalation of opioids - patient want to detox   Polysubstance abuse  -no signs of withdrawal -HIV negative -Psych recommended outpatient rehab -Symptomatic care for now  -Psych evaluation appreciated  -Outpatient therapy, He may benefit from methadone or Suboxone when out in the community   Anxiety  -Continue Cymbalta -Will need outpatient psych  Elevated BP - likely due to pain - Continue to monitor   Hypokalemia - Resolved       Antimicrobials:  Azithromycin 10/17 -> 10/23  Rocephin 10/17 -> 10/19  clinda 10/22-->10/24  Zosyn 10/24 -> Disposition Plan:   Not stable for d/c Family Communication:  no Family at bedside--Total time spent 35 minutes.  Greater than 50% spent face to face counseling and coordinating care.   Consultants:  TCTS, pulmonary  Code Status:  FULL   DVT Prophylaxis:   [place] Lovenox       Subjective: Patient is of some shortness of breath particularly with exertion but better than the time of admission. Denies any nausea, vomiting, diarrhea, abdominal pain, dysuria, hematuria, fevers, chills, headache, neck pain.  Objective: Vitals:   08/28/16 1105 08/28/16 1150 08/28/16 2153 08/29/16 0514  BP:  (!) 142/74 (!) 146/77 140/75  Pulse:  100 (!) 109 (!) 107  Resp:  18 18 19   Temp:  99.3 F (37.4 C) 99 F (37.2 C) 99.1 F (37.3 C)  TempSrc:  Oral Oral Oral  SpO2: 91% 95% 95% 97%  Weight:    116.5 kg (256 lb 14.4 oz)  Height:        Intake/Output Summary (Last 24 hours) at 08/29/16 0731 Last data filed at 08/29/16 0600  Gross per 24 hour  Intake  1060 ml  Output              450 ml  Net              610 ml   Weight change: -0.862 kg (-1 lb 14.4 oz) Exam:   General:  Pt is alert, follows commands appropriately, not in acute distress  HEENT: No icterus, No thrush, No neck mass, [place]/AT  Cardiovascular: RRR, S1/S2, no rubs, no gallops  Respiratory: Diminished  breath sounds right base with right basilar crackles. Left clear to auscultation. No wheezing.  Abdomen: Soft/+BS, non tender, non distended, no guarding  Extremities: No edema, No lymphangitis, No petechiae, No rashes, no synovitis   Data Reviewed: I have personally reviewed following labs and imaging studies Basic Metabolic Panel:  Recent Labs Lab 08/25/16 1358 08/26/16 0328 08/27/16 0403 08/28/16 0436 08/28/16 2112  NA 135 136 136 136 137  K 3.5 3.9 3.6 3.9 4.5  CL 102 102 101 100* 105  CO2 24 25 24 27 23   GLUCOSE 108* 95 78 84 92  BUN 16 16 14 13 15   CREATININE 0.79 0.68 0.75 0.95 0.93  CALCIUM 8.0* 8.1* 8.1* 8.0* 8.1*   Liver Function Tests:  Recent Labs Lab 08/28/16 2112  AST 54*  ALT 44  ALKPHOS 65  BILITOT 0.5  PROT 6.3*  ALBUMIN 2.1*   No results for input(s): LIPASE, AMYLASE in the last 168 hours. No results for input(s): AMMONIA in the last 168 hours. Coagulation Profile:  Recent Labs Lab 08/28/16 0436 08/28/16 2112  INR 1.13 1.08   CBC:  Recent Labs Lab 08/25/16 1358 08/26/16 0328 08/27/16 0403 08/28/16 0436 08/28/16 2112  WBC 19.5* 18.0* 24.6* 24.1* 23.5*  NEUTROABS 15.5* 14.6* 20.8* 20.5*  --   HGB 12.1* 12.3* 11.7* 11.9* 11.9*  HCT 35.1* 36.7* 34.8* 36.4* 36.6*  MCV 86.0 85.9 86.8 89.2 88.4  PLT 391 403* 368 364 356   Cardiac Enzymes:  Recent Labs Lab 08/22/16 0827 08/22/16 1527  TROPONINI <0.03 <0.03   BNP: Invalid input(s): POCBNP CBG: No results for input(s): GLUCAP in the last 168 hours. HbA1C: No results for input(s): HGBA1C in the last 72 hours. Urine analysis:    Component Value Date/Time   COLORURINE AMBER (A) 08/29/2016 0338   APPEARANCEUR CLOUDY (A) 08/29/2016 0338   LABSPEC 1.028 08/29/2016 0338   PHURINE 5.5 08/29/2016 0338   GLUCOSEU NEGATIVE 08/29/2016 0338   HGBUR NEGATIVE 08/29/2016 0338   BILIRUBINUR NEGATIVE 08/29/2016 0338   KETONESUR NEGATIVE 08/29/2016 0338   PROTEINUR NEGATIVE 08/29/2016  0338   NITRITE NEGATIVE 08/29/2016 0338   LEUKOCYTESUR NEGATIVE 08/29/2016 0338   Sepsis Labs: @LABRCNTIP (procalcitonin:4,lacticidven:4) ) Recent Results (from the past 240 hour(s))  Blood Culture (routine x 2)     Status: None   Collection Time: 08/21/16 10:35 PM  Result Value Ref Range Status   Specimen Description BLOOD LEFT ANTECUBITAL  Final   Special Requests BOTTLES DRAWN AEROBIC AND ANAEROBIC 5CC  Final   Culture NO GROWTH 5 DAYS  Final   Report Status 08/26/2016 FINAL  Final  Blood Culture (routine x 2)     Status: None   Collection Time: 08/21/16 10:40 PM  Result Value Ref Range Status   Specimen Description BLOOD LEFT HAND  Final   Special Requests BOTTLES DRAWN AEROBIC AND ANAEROBIC 5CC  Final   Culture NO GROWTH 5 DAYS  Final   Report Status 08/26/2016 FINAL  Final  Urine culture     Status: None  Collection Time: 08/22/16  2:40 AM  Result Value Ref Range Status   Specimen Description URINE, RANDOM  Final   Special Requests NONE  Final   Culture NO GROWTH 1 DAY  Final   Report Status 08/23/2016 FINAL  Final  Surgical pcr screen     Status: None   Collection Time: 08/29/16 12:20 AM  Result Value Ref Range Status   MRSA, PCR NEGATIVE NEGATIVE Final   Staphylococcus aureus NEGATIVE NEGATIVE Final    Comment:        The Xpert SA Assay (FDA approved for NASAL specimens in patients over 43 years of age), is one component of a comprehensive surveillance program.  Test performance has been validated by [facility] for patients greater than or equal to 11 year old. It is not intended to diagnose infection nor to guide or monitor treatment.      Scheduled Meds: . cefUROXime (ZINACEF)  IV  1.5 g Intravenous to SS-Proc  . DULoxetine  60 mg Oral Daily  . enoxaparin (LOVENOX) injection  60 mg Subcutaneous Q24H  . gabapentin  300 mg Oral TID  . nystatin  5 mL Oral QID  . piperacillin-tazobactam (ZOSYN)  IV  3.375 g Intravenous Q8H  . sodium chloride flush  3 mL  Intravenous Q12H   Continuous Infusions:   Procedures/Studies: Dg Chest 2 View  Result Date: 08/27/2016 CLINICAL DATA:  Shortness of breath for 1 week.  Pneumonia. EXAM: CHEST  2 VIEW COMPARISON:  08/24/2016 chest radiograph. FINDINGS: Stable cardiomediastinal silhouette with top-normal heart size. No pneumothorax. Moderate right pleural effusion is increased. No left pleural effusion. There is worsening consolidation and volume loss in the mid to lower right lung. No consolidative airspace disease in the left lung. IMPRESSION: 1. Moderate right pleural effusion, increased. 2. Worsening consolidation and volume loss in the mid to lower right lung, likely combination of pneumonia and atelectasis. Recommend follow-up PA and lateral post treatment chest radiographs to document resolution. Electronically Signed   By: Delbert Phenix M.D.   On: 08/27/2016 08:26   Ct Chest Wo Contrast  Result Date: 08/28/2016 CLINICAL DATA:  Chest pain, shortness of breath, right-sided pleural effusion. EXAM: CT CHEST WITHOUT CONTRAST TECHNIQUE: Multidetector CT imaging of the chest was performed following the standard protocol without IV contrast. COMPARISON:  Radiograph of August 27, 2016. CT scan of August 21, 2016. FINDINGS: Cardiovascular: There is no evidence of thoracic aortic aneurysm. Normal cardiac size. Mediastinum/Nodes: Stable mildly enlarged mediastinal adenopathy is noted, which most likely is inflammatory or reactive in etiology. Lungs/Pleura: No pneumothorax is noted. Minimal left basilar subsegmental atelectasis is noted. Minimal left pleural effusion is noted. Large right pleural effusion is noted with adjacent subsegmental atelectasis of the right upper lobe and atelectasis of the right lower lobe. Focal loculated pleural effusion is seen anteriorly and medially adjacent to the right cardiac border. There is noted substantial thickening of the musculature of the right chest wall consistent with  inflammation, with possible fluid collection forming in this area as well. Upper Abdomen: No definite abnormality seen in the visualized portion of the upper abdomen. Musculoskeletal: Minimally displaced fracture seen involving the anterior portion of right fifth rib, best seen on image number 87 of series 3. IMPRESSION: Minimally displaced fracture involving anterior portion of right fifth rib. Stable mildly enlarged mediastinal adenopathy is noted which most likely is inflammatory or reactive in etiology. Large right pleural effusion is noted which is significantly increased in size compared to prior exam. There is  adjacent subsegmental atelectasis the right upper lobe been atelectasis of the right lower lobe. Focal loculated pleural effusion is seen anteriorly and medially in the right hemi thorax adjacent to the right cardiac border. Substantial thickening of the musculature of the right chest wall is noted consistent with inflammation, with possible fluid collection forming in this area as well. Electronically Signed   By: Lupita Raider, M.D.   On: 08/28/2016 07:09   Ct Angio Chest Pe W/cm &/or Wo Cm  Result Date: 08/21/2016 CLINICAL DATA:  25 year old male with tachycardia and tachypnea. EXAM: CT ANGIOGRAPHY CHEST WITH CONTRAST TECHNIQUE: Multidetector CT imaging of the chest was performed using the standard protocol during bolus administration of intravenous contrast. Multiplanar CT image reconstructions and MIPs were obtained to evaluate the vascular anatomy. CONTRAST:  80 cc Isovue 370 COMPARISON:  Chest radiograph dated 08/21/2016 FINDINGS: Evaluation of this exam is limited due to respiratory motion artifact. Cardiovascular: The thoracic aorta appears unremarkable. No dissection or aneurysm. The origins of the great vessels of the aortic arch appear patent. Evaluation of the pulmonary arteries is limited due to respiratory motion artifact. No definite central pulmonary artery embolus identified.  Top-normal cardiac size. No pericardial effusion. Mediastinum/Nodes: There is no hilar or mediastinal adenopathy. The esophagus is grossly unremarkable. No thyroid nodules identified. Lungs/Pleura: There is a small right pleural effusion. Patchy area of consolidative change involving the right lung base in the right middle and right lower lobe may represent atelectasis versus pneumonia. Left lung base linear and hazy densities may be atelectatic changes or infiltrate. There is no pleural effusion on the left. No pneumothorax. The central airways are patent. Upper Abdomen: No acute abnormality. Musculoskeletal: No chest wall abnormality. No acute or significant osseous findings. Review of the MIP images confirms the above findings. IMPRESSION: Limited evaluation for pulmonary embolism due to respiratory motion artifact. No definite central pulmonary artery embolus identified. Small right pleural effusion with right lung base atelectasis versus pneumonia. Small patchy left lung base density may represent atelectatic changes versus infiltrate. Clinical correlation and follow-up to resolution recommended. Electronically Signed   By: Elgie Collard M.D.   On: 08/21/2016 22:37   Dg Chest Port 1 View  Result Date: 08/24/2016 CLINICAL DATA:  SOB,RIGHT CHEST PAIN,SHOULDER PAIN,FEVER 100 EXAM: PORTABLE CHEST - 1 VIEW COMPARISON:  08/21/2016 FINDINGS: Persistent right pleural effusion with some interval increase in the consolidation/ atelectasis in the right lower and mid lung. Some increase in streaky atelectasis or infiltrate laterally at the left lung base. Mild central pulmonary vascular congestion is now evident. Heart size upper limits normal for technique. No pneumothorax. Visualized bones unremarkable. IMPRESSION: 1. Worsening bibasilar airspace opacities right greater than left. 2. Stable right pleural effusion. 3. Cardiomegaly with central pulmonary vascular congestion. Electronically Signed   By: Corlis Leak  M.D.   On: 08/24/2016 08:58   Dg Chest Portable 1 View  Result Date: 08/21/2016 CLINICAL DATA:  Dyspnea and chest pain for 2 days. Diagnosed with right rib fracture 08/18/2016. EXAM: PORTABLE CHEST 1 VIEW COMPARISON:  08/18/2016. FINDINGS: There is new platelike atelectasis at the right lung base. There is no focal pulmonary consolidation pneumothorax. Heart and mediastinal contours are unremarkable. Low lung volumes on current exam. No acute displaced appearing fracture is identified. With IMPRESSION: Right lower lobe atelectasis. Electronically Signed   By: Tollie Eth M.D.   On: 08/21/2016 19:35    Luster Hechler, DO  [facility] Pager [phone]  If 7PM-7AM, please contact night-coverage [email or web address] Password [email or web address]  08/29/2016, 7:31 AM   LOS: 8 days

## 2016-08-29 NOTE — Progress Notes (Signed)
Patient ID: Bryan Jennings, male   DOB: 1991-02-26, 25 y.o.   MRN: [id number] EVENING ROUNDS NOTE :     [address]       [place] [id number]             [phone]                 Day of Surgery Procedure(s) (LRB): VIDEO ASSISTED THORACOSCOPY (VATS)/ DRAINAGE EMPYEMA (Right) THORACOTOMY MAJOR (Right)  Total Length of Stay:  LOS: 8 days  BP (!) 104/54 (BP Location: Right Arm)   Pulse (!) 46   Temp 97.6 F (36.4 C) (Oral)   Resp 18   Ht 5\' 10"  (1.778 m)   Wt 256 lb 14.4 oz (116.5 kg) Comment: Scale B  SpO2 100%   BMI 36.86 kg/m   .Intake/Output      10/25 0701 - 10/26 0700   P.O. 0   I.V. (mL/kg) 2166.4 (18.6)   NG/GT 105   IV Piggyback 800   Total Intake(mL/kg) 3071.4 (26.4)   Urine (mL/kg/hr) 1975 (1.2)   Drains 60 (0)   Chest Tube 360 (0.2)   Total Output 2395   Net +676.4         . sodium chloride 100 mL/hr at 08/29/16 2100  . bupivacaine ON-Q pain pump    . fentaNYL infusion INTRAVENOUS 100 mcg/hr (08/29/16 2100)  . propofol (DIPRIVAN) infusion 25 mcg/kg/min (08/29/16 2100)     Lab Results  Component Value Date   WBC 18.9 (H) 08/29/2016   HGB 10.2 (L) 08/29/2016   HCT 31.8 (L) 08/29/2016   PLT 302 08/29/2016   GLUCOSE 89 08/29/2016   ALT 42 08/29/2016   AST 45 (H) 08/29/2016   NA 139 08/29/2016   K 4.3 08/29/2016   CL 104 08/29/2016   CREATININE 0.85 08/29/2016   BUN 11 08/29/2016   CO2 25 08/29/2016   INR 1.08 08/28/2016   Sedated on vent after VATS /thoracotomy today  Delight OvensEdward B Talib Headley MD  Beeper [phone] Office [phone] 08/29/2016 9:08 PM

## 2016-08-29 NOTE — Transfer of Care (Signed)
Immediate Anesthesia Transfer of Care Note  Patient: Bryan Jennings  Procedure(s) Performed: Procedure(s): VIDEO ASSISTED THORACOSCOPY (VATS)/ DRAINAGE EMPYEMA (Right) THORACOTOMY MAJOR (Right)  Patient Location: SICU  Anesthesia Type:General  Level of Consciousness: sedated and Patient remains intubated per anesthesia plan  Airway & Oxygen Therapy: Patient remains intubated per anesthesia plan and Patient placed on Ventilator (see vital sign flow sheet for setting)  Post-op Assessment: Report given to RN and Post -op Vital signs reviewed and stable  Post vital signs: Reviewed and stable  Last Vitals:  Vitals:   08/29/16 0900 08/29/16 1353  BP: 131/77 (!) 150/66  Pulse: 96 96  Resp: 18 16  Temp: 37.2 C     Last Pain:  Vitals:   08/29/16 0900  TempSrc: Oral  PainSc:       Patients Stated Pain Goal: 2 (08/27/16 0300)  Complications: No apparent anesthesia complications

## 2016-08-29 NOTE — Anesthesia Postprocedure Evaluation (Signed)
Anesthesia Post Note  Patient: Bryan Jennings  Procedure(s) Performed: Procedure(s) (LRB): VIDEO ASSISTED THORACOSCOPY (VATS)/ DRAINAGE EMPYEMA (Right) THORACOTOMY MAJOR (Right)  Patient location during evaluation: SICU Anesthesia Type: General Level of consciousness: sedated Pain management: pain level controlled Vital Signs Assessment: post-procedure vital signs reviewed and stable Respiratory status: patient remains intubated per anesthesia plan Cardiovascular status: stable Anesthetic complications: no    Last Vitals:  Vitals:   08/29/16 1355 08/29/16 1400  BP: (!) 111/57 126/66  Pulse: 94 99  Resp: 19 (!) 26  Temp:      Last Pain:  Vitals:   08/29/16 1353  TempSrc: Axillary  PainSc:                  Bryan KentJudd, Abbey Veith Jennings

## 2016-08-29 NOTE — Anesthesia Procedure Notes (Signed)
Procedure Name: Intubation Date/Time: 08/29/2016 1:20 PM Performed by: Rogelia BogaMUELLER, Slayde Brault P Pre-anesthesia Checklist: Patient identified, Emergency Drugs available, Suction available, Patient being monitored and Timeout performed Patient Re-evaluated:Patient Re-evaluated prior to inductionTube type: Subglottic suction tube Tube size: 8.0 mm Number of attempts: 1 Placement Confirmation: positive ETCO2 Secured at: 22 cm Tube secured with: Tape Dental Injury: Teeth and Oropharynx as per pre-operative assessment  Comments: Double Lumen EBT exchanged over [facility] C[facility], 8.0 Subglottic ETT easliy placed over cook , +ETCO2, VSS

## 2016-08-29 NOTE — Progress Notes (Signed)
Patient went OR around 0900, alert and oriented, v/s stable. Report given to the OR nurse.

## 2016-08-29 NOTE — Progress Notes (Signed)
The patient was examined and preop studies reviewed. There has been no change from the prior exam and the patient is ready for surgery. Plan Right VATS and drain hemothorax- empyema on Brunetta GeneraJ Litts

## 2016-08-29 NOTE — Procedures (Signed)
Pt placed on vent at this time, initial settings VT 600, RR 12, fio2 50%, peak pressures increased >40, spo2 90%.  CRNA reported pt on 550/16/100% in OR, changes made at this time to these settings and pt tolerating well, RT will monitor

## 2016-08-30 ENCOUNTER — Inpatient Hospital Stay (HOSPITAL_COMMUNITY): Payer: Self-pay

## 2016-08-30 ENCOUNTER — Encounter (HOSPITAL_COMMUNITY): Payer: Self-pay | Admitting: Cardiothoracic Surgery

## 2016-08-30 DIAGNOSIS — R0600 Dyspnea, unspecified: Secondary | ICD-10-CM

## 2016-08-30 DIAGNOSIS — Z0189 Encounter for other specified special examinations: Secondary | ICD-10-CM

## 2016-08-30 LAB — BASIC METABOLIC PANEL
Anion gap: 6 (ref 5–15)
BUN: 11 mg/dL (ref 6–20)
CHLORIDE: 105 mmol/L (ref 101–111)
CO2: 27 mmol/L (ref 22–32)
CREATININE: 0.81 mg/dL (ref 0.61–1.24)
Calcium: 7.9 mg/dL — ABNORMAL LOW (ref 8.9–10.3)
GFR calc Af Amer: >60 mL/min (ref >60)
GFR calc non Af Amer: >60 mL/min (ref >60)
GLUCOSE: 91 mg/dL (ref 65–99)
POTASSIUM: 4.4 mmol/L (ref 3.5–5.1)
Sodium: 138 mmol/L (ref 135–145)

## 2016-08-30 LAB — BLOOD GAS, ARTERIAL
ACID-BASE EXCESS: 1 mmol/L (ref 0.0–2.0)
BICARBONATE: 25.5 mmol/L (ref 20.0–28.0)
Drawn by: 426241
FIO2: 80
LHR: 18 {breaths}/min
O2 SAT: 98.7 %
PEEP/CPAP: 8 cmH2O
PH ART: 7.384 (ref 7.350–7.450)
Patient temperature: 98.6
VT: 550 mL
pCO2 arterial: 43.8 mmHg (ref 32.0–48.0)
pO2, Arterial: 148 mmHg — ABNORMAL HIGH (ref 83.0–108.0)

## 2016-08-30 LAB — ECHOCARDIOGRAM COMPLETE
Height: 70 in
Weight: 4035.3 oz

## 2016-08-30 LAB — BODY FLUID CELL COUNT WITH DIFFERENTIAL
Eos, Fluid: 1 %
Lymphs, Fluid: 5 %
Monocyte-Macrophage-Serous Fluid: 62 % (ref 50–90)
Neutrophil Count, Fluid: 32 % — ABNORMAL HIGH (ref 0–25)
Total Nucleated Cell Count, Fluid: 1950 cu mm — ABNORMAL HIGH (ref 0–1000)

## 2016-08-30 LAB — POCT I-STAT, CHEM 8
BUN: 10 mg/dL (ref 6–20)
CALCIUM ION: 1.08 mmol/L — AB (ref 1.15–1.40)
CHLORIDE: 101 mmol/L (ref 101–111)
CREATININE: 0.9 mg/dL (ref 0.61–1.24)
Glucose, Bld: 126 mg/dL — ABNORMAL HIGH (ref 65–99)
HCT: 36 % — ABNORMAL LOW (ref 39.0–52.0)
Hemoglobin: 12.2 g/dL — ABNORMAL LOW (ref 13.0–17.0)
Potassium: 3.7 mmol/L (ref 3.5–5.1)
SODIUM: 139 mmol/L (ref 135–145)
TCO2: 25 mmol/L (ref 0–100)

## 2016-08-30 LAB — ACID FAST SMEAR (AFB, MYCOBACTERIA): Acid Fast Smear: NEGATIVE

## 2016-08-30 LAB — CBC
HCT: 28.6 % — ABNORMAL LOW (ref 39.0–52.0)
Hemoglobin: 9.1 g/dL — ABNORMAL LOW (ref 13.0–17.0)
MCH: 29.3 pg (ref 26.0–34.0)
MCHC: 31.8 g/dL (ref 30.0–36.0)
MCV: 92 fL (ref 78.0–100.0)
PLATELETS: 265 10*3/uL (ref 150–400)
RBC: 3.11 MIL/uL — AB (ref 4.22–5.81)
RDW: 13.4 % (ref 11.5–15.5)
WBC: 14.3 10*3/uL — AB (ref 4.0–10.5)

## 2016-08-30 LAB — GLUCOSE, CAPILLARY: GLUCOSE-CAPILLARY: 100 mg/dL — AB (ref 65–99)

## 2016-08-30 LAB — HEMOGLOBIN A1C
Hgb A1c MFr Bld: 5.8 % — ABNORMAL HIGH (ref 4.8–5.6)
Mean Plasma Glucose: 120 mg/dL

## 2016-08-30 MED ORDER — POTASSIUM CHLORIDE 10 MEQ/50ML IV SOLN
INTRAVENOUS | Status: AC
  Administered 2016-08-30: 10 meq
  Filled 2016-08-30: qty 50

## 2016-08-30 MED ORDER — POTASSIUM CHLORIDE 10 MEQ/50ML IV SOLN
10.0000 meq | INTRAVENOUS | Status: AC
  Administered 2016-08-30 (×3): 10 meq via INTRAVENOUS
  Filled 2016-08-30 (×2): qty 50

## 2016-08-30 MED ORDER — FUROSEMIDE 10 MG/ML IJ SOLN
20.0000 mg | Freq: Two times a day (BID) | INTRAMUSCULAR | Status: DC
  Administered 2016-08-30 – 2016-09-04 (×11): 20 mg via INTRAVENOUS
  Filled 2016-08-30 (×11): qty 2

## 2016-08-30 MED ORDER — FENTANYL CITRATE (PF) 100 MCG/2ML IJ SOLN
25.0000 ug | INTRAMUSCULAR | Status: DC | PRN
  Administered 2016-08-30 – 2016-09-02 (×17): 25 ug via INTRAVENOUS
  Filled 2016-08-30 (×17): qty 2

## 2016-08-30 MED ORDER — OXYCODONE HCL 5 MG PO TABS
10.0000 mg | ORAL_TABLET | ORAL | Status: DC | PRN
  Administered 2016-08-30 – 2016-09-01 (×7): 10 mg via ORAL
  Filled 2016-08-30 (×7): qty 2

## 2016-08-30 MED ORDER — ORAL CARE MOUTH RINSE
15.0000 mL | Freq: Two times a day (BID) | OROMUCOSAL | Status: DC
  Administered 2016-08-30 – 2016-09-04 (×6): 15 mL via OROMUCOSAL

## 2016-08-30 MED ORDER — KETOROLAC TROMETHAMINE 15 MG/ML IJ SOLN
15.0000 mg | Freq: Four times a day (QID) | INTRAMUSCULAR | Status: DC
  Administered 2016-08-30 – 2016-09-01 (×7): 15 mg via INTRAVENOUS
  Filled 2016-08-30 (×7): qty 1

## 2016-08-30 MED ORDER — CHLORHEXIDINE GLUCONATE 0.12 % MT SOLN
15.0000 mL | Freq: Two times a day (BID) | OROMUCOSAL | Status: DC
  Administered 2016-08-30 – 2016-09-07 (×11): 15 mL via OROMUCOSAL
  Filled 2016-08-30 (×8): qty 15

## 2016-08-30 NOTE — Op Note (Signed)
NAMRigoberto Noel:  Weigold, Jomo                ACCOUNT NO.:  [id number]  MEDICAL RECORD NO.:  [id number]  LOCATION:  2S08C                        FACILITY:  [facility]  PHYSICIAN:  Kerin PernaPeter Van Trigt, M.D.  DATE OF BIRTH:  24-Apr-1991  DATE OF PROCEDURE:  08/29/2016 DATE OF DISCHARGE:                              OPERATIVE REPORT   OPERATION:  Right video-assisted thoracoscopic surgery - mini thoracotomy for drainage of empyema and decortication of right lower lobe.  PREOPERATIVE DIAGNOSIS:  Right empyema, entrapped right lower lobe.  POSTOPERATIVE DIAGNOSIS:  Right empyema, entrapped right lower lobe.  SURGEON:  Kerin PernaPeter Van Trigt, M.D.  ASSISTANT:  Jari Favreessa Conte, PA-C.  ANESTHESIA:  General by Dr. Blima DessertBen Judd.  CLINICAL NOTE:  The patient is a 25 year old Caucasian male with history of drug abuse, previous drug overdose with loss of consciousness and reversal with Narcan by EMT.  He received CPR.  This was in the field and it was unknown if he could have aspirated.  He presented to the hospital about a week later with shortness of breath, right chest pain and opacification of the right hemithorax.  He was admitted by the Medical Service, placed on antibiotics for pneumonia.  He quickly accumulated a large right pleural effusion associated with respiratory insufficiency, tachypnea and a CT scan was performed.  This showed a probable empyema, that was worsening fairly quickly.  A thoracic surgical evaluation was requested.  I examined the patient yesterday on August 28, 2016, in the evening and he was with respirations over 30 per minute and no significant breath sounds on the right side.  I discussed right VATS, drainage of empyema and decortication of the lung with the patient.  He understood that this was the only way to clear his right chest of infection.  I discussed the risks of bleeding, recurrent infection, prolonged air leak, ventilator dependence, and death.  He agreed to proceed with surgery  under what I felt was an informed consent.  OPERATIVE FINDINGS:  The patient is short, obese body habitus made exposure very difficult.  The incision was extended in order to perform an adequate drainage and decortication.  No blood products were administered during the surgery.  OPERATIVE PROCEDURE:  The patient was brought to the operating room and placed supine on the operating table after general anesthesia was induced.  The patient has been previously assessed and marked in the preop holding area and informed consent was signed.  The right chest was prepped and draped, and the patient was reviewed with a proper time-out. Double-lumen tube had been placed by the Anesthesia Team.  When they tried to collapse the right lung and ventilate only left-sided, his saturations dropped.  He was continued on dual lung ventilation until the right chest was entered.  A small incision was made in the fifth interspace anterior to the tip of the right scapula.  The patient's chest wall was several inches thick. The pleural space was entered and serosanguineous blood immediately drained.  This was sent for cultures and cytology.  The incision was extended and approximately a liter of fluid was removed through the suction canister.  The ribs were gently spread with a retractor.  The lung had a fibrinous exudate over the right lower lobe and right hemidiaphragm extending along the right posterior chest wall and also up involving the middle lobe.  Loculated pockets of fluid were encountered, opened and drained.  There was even a pocket in the chest wall anteriorly, which apparently drained through the ribs and this was opened, drained, irrigated, and at the time of closure, separate Al Pimple drain was placed in the space.  The visceral pleura of the right lower lobe had a peel and this was removed with tedious dissection.  After considerable time, the right lung was mobilized and all pockets  of infection opened and fibrinous exudate removed.  Warm saline was used to irrigate the right thorax. Two chest tubes were then placed posteriorly and anteriorly to drain the right hemothorax.  They were brought out through separate incisions and secured to the skin.  The lungs were re-expanded after two pericostal sutures were placed to reapproximate the ribs.  When the lungs were adequately reinflated, the pericostal sutures were tied.  The chest wall was closed in layers using interrupted #1 Vicryl.  The subcutaneous and skin layers were closed with running Vicryl.  An On-Q catheter was placed in the chest wall of 0.5% Marcaine and secured to the skin and connected to the reservoir.  The chest tubes were connected to an underwater seal Pleur-evac drainage system.  The patient was then turned supine.  The endotracheal tube was exchanged from a double-lumen to a single-lumen and the patient remained ventilated on his trip to the ICU after surgery.     Kerin Perna, M.D.     PV/MEDQ  D:  08/29/2016  T:  08/30/2016  Job:  [id number]

## 2016-08-30 NOTE — Progress Notes (Signed)
1 Day Post-Op Procedure(s) (LRB): VIDEO ASSISTED THORACOSCOPY (VATS)/ DRAINAGE EMPYEMA (Right) THORACOTOMY MAJOR (Right) Subjective: Right VATS for drainage of empyema, decortication right lower lobe Patient extubated today Broad-spectrum antibiotics continually until OR cultures returned Ambulated in hallway Pain adequately controlled Objective: Vital signs in last 24 hours: Temp:  [97.2 F (36.2 C)-99.7 F (37.6 C)] 98.9 F (37.2 C) (10/26 1626) Pulse Rate:  [43-105] 100 (10/26 1800) Cardiac Rhythm: Sinus tachycardia (10/26 1800) Resp:  [11-36] 20 (10/26 1800) BP: (96-133)/(45-77) 121/70 (10/26 1800) SpO2:  [95 %-100 %] 96 % (10/26 1800) Arterial Line BP: (96-126)/(43-61) 116/51 (10/26 0300) FiO2 (%):  [40 %-80 %] 40 % (10/26 0854) Weight:  [252 lb 3.3 oz (114.4 kg)] 252 lb 3.3 oz (114.4 kg) (10/26 0200)  Hemodynamic parameters for last 24 hours:  stable  Intake/Output from previous day: 10/25 0701 - 10/26 0700 In: 5049.4 [I.V.:3504.4; NG/GT:245; IV Piggyback:1300] Out: 2905 [Urine:2350; Drains:105; Chest Tube:450] Intake/Output this shift: Total I/O In: 1097.1 [P.O.:360; I.V.:437.1; IV Piggyback:300] Out: 2695 [Urine:2575; Chest Tube:120]       Exam    General- alert and comfortable   Lungs- clear without rales, wheezes   Cor- regular rate and rhythm, no murmur , gallop   Abdomen- soft, non-tender   Extremities - warm, non-tender, minimal edema   Neuro- oriented, appropriate, no focal weakness   Lab Results:  Recent Labs  08/29/16 1505 08/30/16 0415  WBC 18.9* 14.3*  HGB 10.2* 9.1*  HCT 31.8* 28.6*  PLT 302 265   BMET:  Recent Labs  08/29/16 0802 08/29/16 1230 08/30/16 0415  NA 138 139 138  K 4.3 4.3 4.4  CL 104  --  105  CO2 25  --  27  GLUCOSE 89  --  91  BUN 11  --  11  CREATININE 0.85  --  0.81  CALCIUM 8.3*  --  7.9*    PT/INR:  Recent Labs  08/28/16 2112  LABPROT 14.1  INR 1.08   ABG    Component Value Date/Time   PHART 7.384  08/30/2016 0515   HCO3 25.5 08/30/2016 0515   TCO2 28 08/29/2016 1644   ACIDBASEDEF 2.0 08/29/2016 1230   O2SAT 98.7 08/30/2016 0515   CBG (last 3)   Recent Labs  08/29/16 1507 08/30/16 0801  GLUCAP 100* 100*    Assessment/Plan: S/P Procedure(s) (LRB): VIDEO ASSISTED THORACOSCOPY (VATS)/ DRAINAGE EMPYEMA (Right) THORACOTOMY MAJOR (Right) Mobilize Diuresis Leave chest tubes in place today   LOS: 9 days    [name] 08/30/2016

## 2016-08-30 NOTE — Progress Notes (Signed)
  Echocardiogram 2D Echocardiogram has been performed.  Leta JunglingCooper, Chene Kasinger M 08/30/2016, 1:41 PM

## 2016-08-30 NOTE — Progress Notes (Signed)
Name: Layla Gramm MRN: [id number] DOB: [birth year]    ADMISSION DATE:  08/21/2016 CONSULTATION DATE:  08/27/2016  REFERRING MD :  Dr. Randel Pigg  CHIEF COMPLAINT:  Effusion  HISTORY OF PRESENT ILLNESS:  25 year old male with PMH of HTN and substance abuse including IVDA. Apparently he overdosed on heroin 10/13 and a bystander performed CPR. He woke up with EMS by narcan and declined hospitalization at that time. He felt as though he had cracked a rib and has had chest pain. He presented to the emergency department a few days later 10/18 with CC SOB and chest pain. He was admitted to the hospitalist team for sepsis/CAP. He was treated with CAP antibiotics. Unfortunately he did not have much improvement in his symptoms and WBC continued to rise. He was broadened to Zosyn. 10/23 CXR demonstrated large R pleural effusion. Pulmonary consulted for possible thora.  SIGNIFICANT EVENTS  10/13>> CPR/ Narcan for Heroin OD>> Refused hospitalization at time 10/18>> Admit for SOB/ CP>> Sepsis/ CAP 10/25>> R VATS,intubated.   STUDIES:  CT chest 10/17 > Limited evaluation for pulmonary embolism due to respiratory motion artifact. No definite central pulmonary artery embolus identified. Small right pleural effusion with right lung base atelectasis versus pneumonia. Small patchy left lung base density may represent atelectatic changes versus infiltrate.  CT chest 10/23 >>>Minimally displaced fracture  anterior portion of right fifth rib.Stable mildly enlarged mediastinal adenopathy  Most likely is inflammatory or reactive in etiology.Large right pleural effusion  is significantly increased in size compared to prior exam. There is adjacent subsegmental atelectasis the right upper lobe and   right lower lobe. Focal loculated pleural effusion is seen anteriorly and medially in the right hemi thorax adjacent to the right cardiac border. Substantial thickening of the musculature of the right chest wall is  noted consistent with inflammation, with possible fluid collection forming in this area as well. 10/26 CXR right side empyema not improving radiographically.   Cultures  resp cx 10/25 bacterial and fungal >> AFB 10/25 >>    SUBJECTIVE:  Remains intubated, PEEP 5, FIO2 40%. Has some pain on right chest.   VITAL SIGNS: Temp:  [97.2 F (36.2 C)-98.7 F (37.1 C)] 97.2 F (36.2 C) (10/26 0808) Pulse Rate:  [43-99] 67 (10/26 0854) Resp:  [11-26] 24 (10/26 0854) BP: (96-150)/(45-66) 104/49 (10/26 0854) SpO2:  [98 %-100 %] 98 % (10/26 0854) Arterial Line BP: (88-147)/(43-67) 116/51 (10/26 0300) FiO2 (%):  [40 %-100 %] 40 % (10/26 0854) Weight:  [252 lb 3.3 oz (114.4 kg)] 252 lb 3.3 oz (114.4 kg) (10/26 0200)  PHYSICAL EXAMINATION: General:  Young male in NAD, awake, follows commands. Intubated.  Neuro:  Alert, awake  HEENT:  [place]/AT, PERRL, no JVD Cardiovascular:  RRR, no MRG Lungs:  Diminished BS on right side.  Abdomen:  Soft, non-tender Musculoskeletal:  No acute deformity or ROM limitation Skin:  Grossly intact   Recent Labs Lab 08/28/16 2112 08/29/16 0802 08/29/16 1230 08/30/16 0415  NA 137 138 139 138  K 4.5 4.3 4.3 4.4  CL 105 104  --  105  CO2 23 25  --  27  BUN 15 11  --  11  CREATININE 0.93 0.85  --  0.81  GLUCOSE 92 89  --  91    Recent Labs Lab 08/29/16 0930 08/29/16 1230 08/29/16 1505 08/30/16 0415  HGB 10.9* 10.2* 10.2* 9.1*  HCT 33.3* 30.0* 31.8* 28.6*  WBC 21.0*  --  18.9* 14.3*  PLT 314  --  302 265   Dg Chest Port 1 View  Result Date: 08/30/2016 CLINICAL DATA:  25 year old male post VATS.  Subsequent encounter. EXAM: PORTABLE CHEST 1 VIEW COMPARISON:  08/29/2016 chest x-ray. FINDINGS: Right-sided chest tubes in place.  No pneumothorax. Consolidation lung bases greater on the right. This may represent pleural fluid, atelectasis and/ or infiltrate. Pulmonary vascular congestion/ pulmonary edema. Endotracheal tube tip 3.5 cm above the carina.  Nasogastric tube courses below the diaphragm. Tip is not included on the present exam. Right central line proximal to mid superior vena cava. IMPRESSION: Worsening airspace disease greater on the right. This may represent pulmonary edema with pleural effusions and basal atelectasis although basilar infiltrates also consideration. Right chest tubes in place without evidence of pneumothorax. Electronically Signed   By: Lacy DuverneySteven  Olson M.D.   On: 08/30/2016 07:42   Dg Chest Port 1 View  Result Date: 08/29/2016 CLINICAL DATA:  Lung empyema. EXAM: PORTABLE CHEST 1 VIEW COMPARISON:  CT scan and radiograph of August 27, 2016. FINDINGS: Stable cardiomediastinal silhouette. No pneumothorax is noted. Interval placement of endotracheal tube, which is projected over tracheal air shadow with distal tip 4 cm above the carina. Nasogastric tube is noted with distal tip in expected position of cavoatrial junction. Right internal jugular catheter is noted with distal tip in expected position of the SVC. Right-sided chest tube is noted. Right pleural effusion noted on prior exam is significantly smaller, although large residual opacity remains most consistent with combination of atelectasis or infiltrate with associated effusion. Bony thorax is unremarkable. IMPRESSION: Nasogastric tube tip seen in expected position of gastroesophageal junction. Endotracheal tube in grossly good position. Right internal jugular catheter tip in grossly good position. Interval placement of left-sided chest tube with significant reduction in pleural effusion, although significant opacity remains concerning for atelectasis or infiltrate with associated effusion. Electronically Signed   By: Lupita RaiderJames  Green Jr, M.D.   On: 08/29/2016 15:22   Dg Abd Portable 1v  Result Date: 08/29/2016 CLINICAL DATA:  Check nasogastric catheter placement EXAM: PORTABLE ABDOMEN - 1 VIEW COMPARISON:  None. FINDINGS: Scattered large and small bowel gas is noted. A  nasogastric catheter is noted within the stomach. No bony abnormality is seen. IMPRESSION: NG within the stomach. Electronically Signed   By: Alcide CleverMark  Lukens M.D.   On: 08/29/2016 17:54    ASSESSMENT / PLAN:  25 y.o. male with history of substance and IV drug use with large R empyema likely r/t rib fx in setting bystander CPR after drug OD.  Remains on vent post R VATS 10/25.   Right empyema s/p VATS 10/25--  Likely traumatic, could be infectious in a IVDA patient vs untreated PNA as recently had CAP.   Has chest tube, with good amount of drainage.  Chest tube management per CVTS F/up on cultures.  Continue abx vanc and zosyn for now.  Consider getting blood culture to rule out bacteremia as in an IVDU.   Acute hypoxic resp failure 2/2 to R empyema Currently on peep 5, fio2 40%. Could also have some atelectasis due to pain from rib fracture.   Recently treated CAP now with empyema (traumatic vs infectious) Cont broad spectrum abx for now as above.  Try to reduce sedatives.   Rib fracture  Titrate down fentanyl. May still need some opiate in system due chronic IVDU.  Avoid sedatives. Try voltaren gel.    Hyacinth Meekerasrif Ahmed, PGY-3   STAFF NOTE: I, Rory Percyaniel Armonte Tortorella, MD FACP have personally reviewed patient's available data, including medical  history, events of note, physical examination and test results as part of my evaluation. I have discussed with resident/NP and other care providers such as pharmacist, RN and RRT. In addition, I personally evaluated patient and elicited key findings of: awake, FC, improved aeration lungs rt apical, s/p vats for empyema per OP Note, pcxr improved apical, abg reviewed, keep same mV, wean aggressive CPAP 5 ps5, goal 30 min assess rsbi , upright position needed, bun 11, consider kvo, this is possible empyema, its not clear to me, assess cell count diff on fluid, send BC, continued vanc / zosyn, IVDA - get echo, pre test probability high for risk endocarditis,  updated pt in room The patient is critically ill with multiple organ systems failure and requires high complexity decision making for assessment and support, frequent evaluation and titration of therapies, application of advanced monitoring technologies and extensive interpretation of multiple databases.   Critical Care Time devoted to patient care services described in this note is 35 Minutes. This time reflects time of care of this signee: Rory Percy, MD FACP. This critical care time does not reflect procedure time, or teaching time or supervisory time of PA/NP/Med student/Med Resident etc but could involve care discussion time. Rest per NP/medical resident whose note is outlined above and that I agree with   Mcarthur Rossetti. Tyson Alias, MD, FACP Pgr: [phone] [facility] 08/30/2016 10:08 AM

## 2016-08-30 NOTE — Progress Notes (Signed)
Art line waveform dampened, no blood return through Safe-set. Called RT to bedside to assess, A-line clotted off. Removed, no bleeding noted to site, small hematoma. Pressure held for 5 minutes. Will continue to monitor.  Herma ArdMOSELEY, Ambra Haverstick F, RN

## 2016-08-30 NOTE — Progress Notes (Signed)
Pt extubated per MD order. Pt tol well. Positive leak test. No stridor or distress noted. Pt placed on 4 L [place]. VS stable. Will cont to monitor.

## 2016-08-31 ENCOUNTER — Inpatient Hospital Stay (HOSPITAL_COMMUNITY): Payer: Self-pay

## 2016-08-31 LAB — CBC
HEMATOCRIT: 29.9 % — AB (ref 39.0–52.0)
HEMOGLOBIN: 9.6 g/dL — AB (ref 13.0–17.0)
MCH: 29.2 pg (ref 26.0–34.0)
MCHC: 32.4 g/dL (ref 30.0–36.0)
MCV: 90.1 fL (ref 78.0–100.0)
Platelets: 425 10*3/uL — ABNORMAL HIGH (ref 150–400)
RBC: 3.32 MIL/uL — ABNORMAL LOW (ref 4.22–5.81)
RDW: 13.1 % (ref 11.5–15.5)
WBC: 14.5 10*3/uL — AB (ref 4.0–10.5)

## 2016-08-31 LAB — COMPREHENSIVE METABOLIC PANEL
ALBUMIN: 1.8 g/dL — AB (ref 3.5–5.0)
ALK PHOS: 46 U/L (ref 38–126)
ALT: 33 U/L (ref 17–63)
AST: 44 U/L — AB (ref 15–41)
Anion gap: 8 (ref 5–15)
BILIRUBIN TOTAL: 0.7 mg/dL (ref 0.3–1.2)
BUN: 11 mg/dL (ref 6–20)
CALCIUM: 8 mg/dL — AB (ref 8.9–10.3)
CO2: 26 mmol/L (ref 22–32)
CREATININE: 0.99 mg/dL (ref 0.61–1.24)
Chloride: 104 mmol/L (ref 101–111)
GFR calc Af Amer: >60 mL/min (ref >60)
GLUCOSE: 105 mg/dL — AB (ref 65–99)
POTASSIUM: 3.7 mmol/L (ref 3.5–5.1)
Sodium: 138 mmol/L (ref 135–145)
TOTAL PROTEIN: 5.4 g/dL — AB (ref 6.5–8.1)

## 2016-08-31 LAB — FUNGUS STAIN

## 2016-08-31 LAB — GLUCOSE, CAPILLARY: GLUCOSE-CAPILLARY: 93 mg/dL (ref 65–99)

## 2016-08-31 LAB — FUNGAL STAIN REFLEX

## 2016-08-31 MED ORDER — CEFTRIAXONE SODIUM 1 G IJ SOLR
1.0000 g | INTRAMUSCULAR | Status: DC
  Administered 2016-08-31: 1 g via INTRAVENOUS
  Filled 2016-08-31 (×2): qty 10

## 2016-08-31 MED ORDER — SENNA 8.6 MG PO TABS
1.0000 | ORAL_TABLET | Freq: Two times a day (BID) | ORAL | Status: DC
  Administered 2016-08-31 – 2016-09-04 (×5): 8.6 mg via ORAL
  Filled 2016-08-31 (×11): qty 1

## 2016-08-31 MED ORDER — SODIUM CHLORIDE 0.9% FLUSH: 10.0000 mL | INTRAVENOUS | Status: DC | PRN

## 2016-08-31 MED ORDER — FENTANYL BOLUS VIA INFUSION
25.0000 ug | INTRAVENOUS | Status: DC | PRN
  Filled 2016-08-31: qty 100

## 2016-08-31 MED ORDER — POTASSIUM CHLORIDE ER 10 MEQ PO TBCR
20.0000 meq | EXTENDED_RELEASE_TABLET | Freq: Once | ORAL | Status: AC
  Administered 2016-08-31: 20 meq via ORAL
  Filled 2016-08-31 (×2): qty 2

## 2016-08-31 MED ORDER — SODIUM CHLORIDE 0.9% FLUSH
10.0000 mL | Freq: Two times a day (BID) | INTRAVENOUS | Status: DC
  Administered 2016-08-31: 20 mL
  Administered 2016-08-31: 30 mL
  Administered 2016-09-01 (×2): 10 mL
  Administered 2016-09-02: 20 mL
  Administered 2016-09-03: 10 mL

## 2016-08-31 NOTE — Progress Notes (Signed)
2 Days Post-Op Procedure(s) (LRB): VIDEO ASSISTED THORACOSCOPY (VATS)/ DRAINAGE EMPYEMA (Right) THORACOTOMY MAJOR (Right) Subjective: Right VATS for empyema and decortication right lower lobe Pain well controlled Continue broad-spectrum antibiotics-operative cultures nonrevealing Both chest tubes remained today  Objective: Vital signs in last 24 hours: Temp:  [98.2 F (36.8 C)-99.4 F (37.4 C)] 98.5 F (36.9 C) (10/27 1912) Pulse Rate:  [68-110] 85 (10/27 1912) Cardiac Rhythm: Normal sinus rhythm (10/27 1908) Resp:  [14-33] 32 (10/27 1912) BP: (97-157)/(55-95) 134/75 (10/27 1912) SpO2:  [90 %-100 %] 98 % (10/27 1912)  Hemodynamic parameters for last 24 hours:    Intake/Output from previous day: 10/26 0701 - 10/27 0700 In: 1957.1 [P.O.:360; I.V.:547.1; IV Piggyback:1050] Out: 3510 [Urine:3280; Drains:60; Chest Tube:170] Intake/Output this shift: Total I/O In: -  Out: 475 [Urine:475]       Exam    General- alert and comfortable   Lungs- clear without rales, wheezes   Cor- regular rate and rhythm, no murmur , gallop   Abdomen- soft, non-tender   Extremities - warm, non-tender, minimal edema   Neuro- oriented, appropriate, no focal weakness   Lab Results:  Recent Labs  08/30/16 0415 08/30/16 1839 08/31/16 0500  WBC 14.3*  --  14.5*  HGB 9.1* 12.2* 9.6*  HCT 28.6* 36.0* 29.9*  PLT 265  --  425*   BMET:  Recent Labs  08/30/16 0415 08/30/16 1839 08/31/16 0500  NA 138 139 138  K 4.4 3.7 3.7  CL 105 101 104  CO2 27  --  26  GLUCOSE 91 126* 105*  BUN 11 10 11   CREATININE 0.81 0.90 0.99  CALCIUM 7.9*  --  8.0*    PT/INR:  Recent Labs  08/28/16 2112  LABPROT 14.1  INR 1.08   ABG    Component Value Date/Time   PHART 7.384 08/30/2016 0515   HCO3 25.5 08/30/2016 0515   TCO2 25 08/30/2016 1839   ACIDBASEDEF 2.0 08/29/2016 1230   O2SAT 98.7 08/30/2016 0515   CBG (last 3)   Recent Labs  08/29/16 1507 08/30/16 0801  GLUCAP 100* 100*     Assessment/Plan: S/P Procedure(s) (LRB): VIDEO ASSISTED THORACOSCOPY (VATS)/ DRAINAGE EMPYEMA (Right) THORACOTOMY MAJOR (Right) Mobilize continue current care continue antibiotics   LOS: 10 days    [name] 08/31/2016

## 2016-08-31 NOTE — Progress Notes (Signed)
Pt admitted from unit 2S via wheelchair and christine RN.  Pt ambulated to chair with steady gait.  Pt connected to telemetry and chest tubes x2 at 20cm suction.  No air leak noted. CHG wipes administered.  Pt given info about unit 3S.  Pt in no acute distress at this point.

## 2016-08-31 NOTE — Progress Notes (Signed)
Utilization review completed.  L. J. Jakorian Marengo RN, BSN, CM 

## 2016-08-31 NOTE — Progress Notes (Addendum)
Name: Bryan Jennings MRN: [id number] DOB: 05-19-1991    ADMISSION DATE:  08/21/2016 CONSULTATION DATE:  08/27/2016  REFERRING MD :  Dr. Randel Pigg  CHIEF COMPLAINT:  Effusion  HISTORY OF PRESENT ILLNESS:  25 year old male with PMH of HTN and substance abuse including IVDA. Apparently he overdosed on heroin 10/13 and a bystander performed CPR. He woke up with EMS by narcan and declined hospitalization at that time. He felt as though he had cracked a rib and has had chest pain. He presented to the emergency department a few days later 10/18 with CC SOB and chest pain. He was admitted to the hospitalist team for sepsis/CAP. He was treated with CAP antibiotics. Unfortunately he did not have much improvement in his symptoms and WBC continued to rise. He was broadened to Zosyn. 10/23 CXR demonstrated large R pleural effusion. Pulmonary consulted for possible thora.  SIGNIFICANT EVENTS  10/13>> CPR/ Narcan for Heroin OD>> Refused hospitalization at time 10/18>> Admit for SOB/ CP>> Sepsis/ CAP 10/25>> R VATS,intubated.  10/26 >> extubated.   STUDIES:  CT chest 10/17 > Limited evaluation for pulmonary embolism due to respiratory motion artifact. No definite central pulmonary artery embolus identified. Small right pleural effusion with right lung base atelectasis versus pneumonia. Small patchy left lung base density may represent atelectatic changes versus infiltrate.  CT chest 10/23 >>>Minimally displaced fracture  anterior portion of right fifth rib.Stable mildly enlarged mediastinal adenopathy  Most likely is inflammatory or reactive in etiology.Large right pleural effusion  is significantly increased in size compared to prior exam. There is adjacent subsegmental atelectasis the right upper lobe and   right lower lobe. Focal loculated pleural effusion is seen anteriorly and medially in the right hemi thorax adjacent to the right cardiac border. Substantial thickening of the musculature of the  right chest wall is noted consistent with inflammation, with possible fluid collection forming in this area as well. 10/26 CXR right side empyema not improving radiographically.  10/26 ECHO - no vegetations. Normal EF, trivial pericardial effusion.  10/27 cxr - unchanged right sided effusion.  Cultures  resp cx 10/25 bacterial and fungal >> AFB 10/25 >>  bcx 10/26 >>   SUBJECTIVE:  Extubated. Doing well. Pain is improving but still has rib pain.   VITAL SIGNS: Temp:  [98.2 F (36.8 C)-99.7 F (37.6 C)] 98.9 F (37.2 C) (10/27 0357) Pulse Rate:  [59-107] 68 (10/27 0700) Resp:  [14-36] 22 (10/27 0700) BP: (97-133)/(49-95) 131/75 (10/27 0700) SpO2:  [95 %-100 %] 100 % (10/27 0700) FiO2 (%):  [40 %] 40 % (10/26 0854)  PHYSICAL EXAMINATION: General:  Young male in NAD, awake, follows commands. Neuro:  Alert, awake  HEENT:  [place]/AT, PERRL, no JVD Cardiovascular:  RRR, no MRG Lungs:  Diminished BS on right side.  Abdomen:  Soft, non-tender Musculoskeletal:  No acute deformity or ROM limitation Skin:  Grossly intact   Recent Labs Lab 08/29/16 0802  08/30/16 0415 08/30/16 1839 08/31/16 0500  NA 138  < > 138 139 138  K 4.3  < > 4.4 3.7 3.7  CL 104  --  105 101 104  CO2 25  --  27  --  26  BUN 11  --  11 10 11   CREATININE 0.85  --  0.81 0.90 0.99  GLUCOSE 89  --  91 126* 105*  < > = values in this interval not displayed.  Recent Labs Lab 08/29/16 1505 08/30/16 0415 08/30/16 1839 08/31/16 0500  HGB 10.2*  9.1* 12.2* 9.6*  HCT 31.8* 28.6* 36.0* 29.9*  WBC 18.9* 14.3*  --  14.5*  PLT 302 265  --  425*   Dg Chest Port 1 View  Result Date: 08/30/2016 CLINICAL DATA:  25 year old male post VATS.  Subsequent encounter. EXAM: PORTABLE CHEST 1 VIEW COMPARISON:  08/29/2016 chest x-ray. FINDINGS: Right-sided chest tubes in place.  No pneumothorax. Consolidation lung bases greater on the right. This may represent pleural fluid, atelectasis and/ or infiltrate. Pulmonary  vascular congestion/ pulmonary edema. Endotracheal tube tip 3.5 cm above the carina. Nasogastric tube courses below the diaphragm. Tip is not included on the present exam. Right central line proximal to mid superior vena cava. IMPRESSION: Worsening airspace disease greater on the right. This may represent pulmonary edema with pleural effusions and basal atelectasis although basilar infiltrates also consideration. Right chest tubes in place without evidence of pneumothorax. Electronically Signed   By: Lacy Duverney M.D.   On: 08/30/2016 07:42   Dg Chest Port 1 View  Result Date: 08/29/2016 CLINICAL DATA:  Lung empyema. EXAM: PORTABLE CHEST 1 VIEW COMPARISON:  CT scan and radiograph of August 27, 2016. FINDINGS: Stable cardiomediastinal silhouette. No pneumothorax is noted. Interval placement of endotracheal tube, which is projected over tracheal air shadow with distal tip 4 cm above the carina. Nasogastric tube is noted with distal tip in expected position of cavoatrial junction. Right internal jugular catheter is noted with distal tip in expected position of the SVC. Right-sided chest tube is noted. Right pleural effusion noted on prior exam is significantly smaller, although large residual opacity remains most consistent with combination of atelectasis or infiltrate with associated effusion. Bony thorax is unremarkable. IMPRESSION: Nasogastric tube tip seen in expected position of gastroesophageal junction. Endotracheal tube in grossly good position. Right internal jugular catheter tip in grossly good position. Interval placement of left-sided chest tube with significant reduction in pleural effusion, although significant opacity remains concerning for atelectasis or infiltrate with associated effusion. Electronically Signed   By: Lupita Raider, M.D.   On: 08/29/2016 15:22   Dg Abd Portable 1v  Result Date: 08/29/2016 CLINICAL DATA:  Check nasogastric catheter placement EXAM: PORTABLE ABDOMEN - 1 VIEW  COMPARISON:  None. FINDINGS: Scattered large and small bowel gas is noted. A nasogastric catheter is noted within the stomach. No bony abnormality is seen. IMPRESSION: NG within the stomach. Electronically Signed   By: Alcide Clever M.D.   On: 08/29/2016 17:54    ASSESSMENT / PLAN:  25 y.o. male with history of substance and IV drug use with large R empyema likely r/t rib fx in setting bystander CPR after drug OD.  Remains on vent post R VATS 10/25.   Right empyema s/p VATS 10/25--  Likely traumatic, could be infectious in a IVDA patient vs untreated PNA as recently had CAP.  Cell count 1950 WBC, 32% neutrophil, 62% monocyte-macrophage.  Has chest tube, with good amount of drainage.  Chest tube management per CVTS F/up on cultures.  Continue abx vanc and zosyn for now.  ECHO negative for vegetations.   Acute hypoxic resp failure 2/2 to R empyema Doing much better. Extubated.  Required some oxygen with ambulation. On RA at rest. Will likely continue to improve as effusion size decreases  Recently treated CAP now with empyema (traumatic vs infectious) Cont broad spectrum abx for now as above.   Rib fracture  Titrate down fentanyl. May still need some opiate in system due chronic IVDU.  Avoid sedatives. Try voltaren gel.  Heroine abuse Last used 2 weeks ago, tried to quit in the past. [facility] is [facility]. Wants to be on suboxone. We discussed the risk of using IVDU and he understands it can be lethal. Will ask social worker to discuss the resources with him.    Hyacinth Meekerasrif Ahmed, PGY-3    STAFF NOTE: I, Rory Percyaniel Feinstein, MD FACP have personally reviewed patient's available data, including medical history, events of note, physical examination and test results as part of my evaluation. I have discussed with resident/NP and other care providers such as pharmacist, RN and RRT. In addition, I personally evaluated patient and elicited key findings of: awake,  follows commands well, no distress, reduced BS, chest tube present rt, on RA, neg 1.3 ,liters neg noted,  Remains culture neg, NO need isolation with AFB sent in OR, ambulate, AFB just back neg, few staph noted in culture - this is concerning for pathogen in this setting, maintain vanc, follow sens pattern, consider narrow off zosyn to ceftriaxone, and follow staph species , in end may just need monotherapy forr this staph, will follow up monday, call if needed, IS, echo neg veg, no roll TEE unless BC pos, consider transition out of icu, per cvts  Mcarthur Rossettianiel J. Tyson AliasFeinstein, MD, FACP Pgr: [phone] [facility] 08/31/2016 10:46 AM

## 2016-08-31 NOTE — Progress Notes (Signed)
Peripherally Inserted Central Catheter/Midline Placement  The IV Nurse has discussed with the patient and/or persons authorized to consent for the patient, the purpose of this procedure and the potential benefits and risks involved with this procedure.  The benefits include less needle sticks, lab draws from the catheter, and the patient may be discharged home with the catheter. Risks include, but not limited to, infection, bleeding, blood clot (thrombus formation), and puncture of an artery; nerve damage and irregular heartbeat and possibility to perform a PICC exchange if needed/ordered by physician.  Alternatives to this procedure were also discussed.  Bard Power PICC patient education guide, fact sheet on infection prevention and patient information card has been provided to patient /or left at bedside.    PICC/Midline Placement Documentation  PICC Double Lumen 08/31/16 PICC Right Brachial 44 cm 3 cm (Active)  Indication for Insertion or Continuance of Line Limited venous access - need for IV therapy >5 days (PICC only) 08/31/2016  8:00 AM  Exposed Catheter (cm) 3 cm 08/31/2016  8:00 AM  Dressing Change Due 09/07/16 08/31/2016  8:00 AM       [name], [name] 08/31/2016, 8:12 AM

## 2016-09-01 ENCOUNTER — Inpatient Hospital Stay (HOSPITAL_COMMUNITY): Payer: Self-pay

## 2016-09-01 LAB — BASIC METABOLIC PANEL
Anion gap: 8 (ref 5–15)
BUN: 10 mg/dL (ref 6–20)
CO2: 26 mmol/L (ref 22–32)
Calcium: 8.2 mg/dL — ABNORMAL LOW (ref 8.9–10.3)
Chloride: 104 mmol/L (ref 101–111)
Creatinine, Ser: 0.72 mg/dL (ref 0.61–1.24)
GFR calc Af Amer: >60 mL/min (ref >60)
GFR calc non Af Amer: >60 mL/min (ref >60)
Glucose, Bld: 95 mg/dL (ref 65–99)
Potassium: 4.2 mmol/L (ref 3.5–5.1)
Sodium: 138 mmol/L (ref 135–145)

## 2016-09-01 LAB — CULTURE, RESPIRATORY W GRAM STAIN
Culture: NORMAL
Special Requests: NORMAL

## 2016-09-01 LAB — TYPE AND SCREEN
ABO/RH(D): A POS
Antibody Screen: NEGATIVE
Unit division: 0
Unit division: 0

## 2016-09-01 LAB — CBC
HCT: 30.5 % — ABNORMAL LOW (ref 39.0–52.0)
Hemoglobin: 9.8 g/dL — ABNORMAL LOW (ref 13.0–17.0)
MCH: 28.6 pg (ref 26.0–34.0)
MCHC: 32.1 g/dL (ref 30.0–36.0)
MCV: 88.9 fL (ref 78.0–100.0)
Platelets: 501 10*3/uL — ABNORMAL HIGH (ref 150–400)
RBC: 3.43 MIL/uL — ABNORMAL LOW (ref 4.22–5.81)
RDW: 12.8 % (ref 11.5–15.5)
WBC: 12.9 10*3/uL — ABNORMAL HIGH (ref 4.0–10.5)

## 2016-09-01 LAB — VANCOMYCIN, TROUGH: Vancomycin Tr: 11 ug/mL — ABNORMAL LOW (ref 15–20)

## 2016-09-01 MED ORDER — VANCOMYCIN HCL 10 G IV SOLR
1250.0000 mg | Freq: Three times a day (TID) | INTRAVENOUS | Status: DC
Start: 2016-09-01 — End: 2016-09-07
  Administered 2016-09-01 – 2016-09-07 (×17): 1250 mg via INTRAVENOUS
  Filled 2016-09-01 (×22): qty 1250

## 2016-09-01 MED ORDER — OXYCODONE HCL 5 MG PO TABS
10.0000 mg | ORAL_TABLET | ORAL | Status: DC | PRN
  Administered 2016-09-01 – 2016-09-07 (×26): 10 mg via ORAL
  Filled 2016-09-01 (×27): qty 2

## 2016-09-01 NOTE — Progress Notes (Deleted)
Patient unable to void. Bladder scan performed showing >542ml. In and Out cath to be performed. Awaiting urine team nurse from [facility]; states she can be on the unit in about 30mins.   

## 2016-09-01 NOTE — Progress Notes (Signed)
Patient's chest tube removed this afternoon at 1430. At 1800 patient told RN he has had worsening discomfort/chest pain on his right side from the last 30 minutes. Denies SOB and O2 sats have remained stable. Upon assessment pt's lung sounds appeared diminished with expiratory wheezing. Doree Fudgeonielle Zimmerman, PA notified. [facility] Will monitor.  Leanna BattlesEckelmann, Kashmir Lysaght Eileen, RN.

## 2016-09-01 NOTE — Progress Notes (Signed)
CRITICAL VALUE ALERT  Critical value received:  Pleural fluid + rare staph aureus   Date of notification:  09/01/16  Time of notification:  1018  Critical value read back:Yes.    Nurse who received alert:  Hortense Ramalybill Kaiesha Tonner, RN  MD notified (1st page):  Doree Fudgeonielle Zimmerman, PA  Time of first page:  1025  MD notified (2nd page):  Time of second page:  Responding MD:  Doree Fudgeonielle Zimmerman, PA  Time MD responded:  1026

## 2016-09-01 NOTE — Progress Notes (Addendum)
[address]       [place] [id number]             [phone]       3 Days Post-Op Procedure(s) (LRB): VIDEO ASSISTED THORACOSCOPY (VATS)/ DRAINAGE EMPYEMA (Right) THORACOTOMY MAJOR (Right)  Subjective: Patient states slowly feeling better. Had bowel movement this am.  Objective: Vital signs in last 24 hours: Temp:  [98.1 F (36.7 C)-99.4 F (37.4 C)] 98.2 F (36.8 C) (10/28 0707) Pulse Rate:  [85-110] 94 (10/28 0707) Cardiac Rhythm: Normal sinus rhythm (10/28 0704) Resp:  [18-37] 30 (10/28 0707) BP: (134-157)/(75-95) 141/83 (10/28 0707) SpO2:  [90 %-99 %] 98 % (10/28 0707)      Intake/Output from previous day: 10/27 0701 - 10/28 0700 In: 2040 [P.O.:1440; I.V.:200; IV Piggyback:400] Out: 2355 [Urine:2200; Drains:25; Chest Tube:130]   Physical Exam:  Cardiovascular: RRR Pulmonary: Clear to auscultation on left and diminished left base Abdomen: Soft, non tender, bowel sounds present. Wounds: Clean and dry.  No erythema or signs of infection. Chest Tubes: to water seal, no air leak.  Lab Results: CBC: Recent Labs  08/31/16 0500 09/01/16 0415  WBC 14.5* 12.9*  HGB 9.6* 9.8*  HCT 29.9* 30.5*  PLT 425* 501*   BMET:  Recent Labs  08/31/16 0500 09/01/16 0415  NA 138 138  K 3.7 4.2  CL 104 104  CO2 26 26  GLUCOSE 105* 95  BUN 11 10  CREATININE 0.99 0.72  CALCIUM 8.0* 8.2*    PT/INR: No results for input(s): LABPROT, INR in the last 72 hours. ABG:  INR: Will add last result for INR, ABG once components are confirmed Will add last 4 CBG results once components are confirmed  Assessment/Plan:  1. CV - SR in the 90's. 2.  Pulmonary - On room air. Chest tubes with 130 cc of output last 24 hours. Chest tubes are to water seal. There is no air leak. CXR this am appears to show no pneumothorax, consolidation on right (effusion,empyema). Remove one chest tube. Encourage incentive spirometer. 3. ID-On Ceftriaxone and Vanco for right  empyema. AFB and fungal negative and blood culture shows no growth to date. Right pleural fluid few MRSA and no anaerobes. Will stop Ceftriaxone. 4. Anemia-H and H stable at 9.8 and 30.5 5. 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  Test performance has been validated by [facility] for patients greater than or equal to 26 year old. It is not intended to diagnose infection nor to guide or monitor treatment.   Body fluid culture     Status: None (Preliminary result)   Collection Time: 08/29/16 11:17 AM  Result Value Ref Range Status   Specimen Description FLUID RIGHT PLEURAL  Final   Special Requests POF ZINACEF ZOSYN AND CLINDAMYCIN  Final   Gram Stain   Final    MODERATE WBC PRESENT, PREDOMINANTLY PMN  NO ORGANISMS SEEN    Culture   Final    RARE STAPHYLOCOCCUS AUREUS CRITICAL RESULT CALLED TO, READ BACK BY AND VERIFIED WITH: C. ECKELMANN, NURSE AT 1018 ON [id number] BY Lucienne CapersS. YARBROUGH    Report Status PENDING  Incomplete  Fungus Stain     Status: None   Collection Time: 08/29/16 11:17 AM  Result Value Ref Range Status   FUNGUS STAIN Final report  Final    Comment: (NOTE) Performed At: [facility] [address] [place], [place] [id number] Mila HomerHancock William F MD XN:[phone]    Fungal Source FLUID  Final    Comment: PLEURAL RIGHT POF ZINACEF ZOSYN AND CLINDAMYCIN   Acid Fast Smear (AFB)     Status: None    Collection Time: 08/29/16 11:17 AM  Result Value Ref Range Status   AFB Specimen Processing Concentration  Final   Acid Fast Smear Negative  Final    Comment: (NOTE) Performed At: [facility] [address] [place], [place] [id number] Mila HomerHancock William F MD SE:[phone]    Source (AFB) FLUID  Final    Comment: RIGHT PLEURAL POF ZINACEF ZOSYN AND CLINDAMYCIN   Fungal Stain reflex     Status: None   Collection Time: 08/29/16 11:17 AM  Result Value Ref Range Status   Fungal stain result 1 Comment  Final    Comment: (NOTE) KOH/Calcofluor preparation:  no fungus observed. Performed At: [facility] [address] [place], [place] [id number] Mila HomerHancock William F MD SW:[phone]   Aerobic/Anaerobic Culture (surgical/deep wound)     Status: None (Preliminary result)   Collection Time: 08/29/16 11:28 AM  Result Value Ref Range Status   Specimen Description TISSUE RIGHT PLEURAL  Final   Special Requests POF ZINACEF ZOSYN AND CLINDAMYCIN  Final   Gram Stain   Final    FEW WBC PRESENT, PREDOMINANTLY PMN RARE GRAM NEGATIVE RODS RARE GRAM POSITIVE COCCI IN CLUSTERS IN PAIRS    Culture   Final    FEW METHICILLIN RESISTANT STAPHYLOCOCCUS AUREUS NO ANAEROBES ISOLATED; CULTURE IN PROGRESS FOR 5 DAYS    Report Status PENDING  Incomplete   Organism ID, Bacteria METHICILLIN RESISTANT STAPHYLOCOCCUS AUREUS  Final      Susceptibility   Methicillin resistant staphylococcus aureus - MIC*    CIPROFLOXACIN >=8 RESISTANT Resistant     ERYTHROMYCIN <=0.25 SENSITIVE Sensitive     GENTAMICIN <=0.5 SENSITIVE Sensitive     OXACILLIN >=4 RESISTANT Resistant     TETRACYCLINE <=1 SENSITIVE Sensitive     VANCOMYCIN <=0.5 SENSITIVE Sensitive     TRIMETH/SULFA <=10 SENSITIVE Sensitive     CLINDAMYCIN <=0.25 SENSITIVE Sensitive     RIFAMPIN <=0.5 SENSITIVE Sensitive     Inducible Clindamycin NEGATIVE Sensitive     * FEW METHICILLIN RESISTANT STAPHYLOCOCCUS AUREUS  Culture, respiratory  (NON-Expectorated)     Status: None (Preliminary result)   Collection Time: 08/29/16  4:22 PM  Result Value Ref Range Status   Specimen Description TRACHEAL ASPIRATE  Final   Special Requests Normal  Final   Gram Stain   Final    ABUNDANT WBC PRESENT, PREDOMINANTLY PMN NO ORGANISMS SEEN    Culture CULTURE REINCUBATED FOR BETTER GROWTH  Final   Report Status PENDING  Incomplete  Culture, blood (routine x 2)     Status: None (Preliminary result)   Collection Time: 08/30/16 11:30 AM  Result Value Ref Range Status   Specimen Description BLOOD RIGHT ARM  Final   Special Requests IN PEDIATRIC BOTTLE 1CC  Final  Culture NO GROWTH < 24 HOURS  Final   Report Status PENDING  Incomplete  Culture, blood (routine x 2)     Status: None (Preliminary result)   Collection Time: 08/30/16 12:55 PM  Result Value Ref Range Status   Specimen Description BLOOD RIGHT THUMB  Final   Special Requests IN PEDIATRIC BOTTLE  1.5CC  Final   Culture NO GROWTH < 24 HOURS  Final   Report Status PENDING  Incomplete    I have seen and examined Bryan Jennings and agree with the above assessment  and plan.  Delight Ovens MD Beeper [phone] Office [phone] 09/01/2016 12:22 PM

## 2016-09-01 NOTE — Progress Notes (Signed)
Pharmacy Antibiotic Note  Bryan Jennings is a 25 y.o. male admitted on 08/21/2016 with empyema s/p R VATS on 10/24.  History of IVDA noted. Pharmacy has been consulted for vancomycin dosing. Afebrile, WBC trending down to 12.9, CrCl > 100 ml/min  10/28 VT subtherapeutic at 11  Plan: Increase vancomycin to 1.25g IV q8h. Goal trough 15-20 Monitor clinical progress, renal function, abx plan/LOT Check VT at steady state   Height: 5\' 10"  (177.8 cm) Weight: 252 lb 3.3 oz (114.4 kg) IBW/kg (Calculated) : 73  Temp (24hrs), Avg:98.4 F (36.9 C), Min:98.1 F (36.7 C), Max:99.3 F (37.4 C)   Recent Labs Lab 08/29/16 0802 08/29/16 0930 08/29/16 1505 08/30/16 0415 08/30/16 1839 08/31/16 0500 09/01/16 0415 09/01/16 1430  WBC  --  21.0* 18.9* 14.3*  --  14.5* 12.9*  --   CREATININE 0.85  --   --  0.81 0.90 0.99 0.72  --   VANCOTROUGH  --   --   --   --   --   --   --  11*    Estimated Creatinine Clearance: 178.9 mL/min (by C-G formula based on SCr of 0.72 mg/dL).    No Known Allergies  Antimicrobials this admission:  10/17 azithro >> 10/23 10/17 CTX >> 10/20 10/20 zosyn >>10/22, 10/24>>10/27 Clinda 10/22>>10/24 10/25 vanc>>  Dose adjustments this admission:  10/28 VT 11: 1g>1.25g q8h  Microbiology results:  10/18 BCx: neg 10/18 UCx: neg 10/25 mrsa pcr: neg 10/25 R pleural tissue cx: few MRSA 10/25 R pleural fluid: pending - rare S. aureus 10/25 fungal cx: 10/25 TA: pending - no organisms seen 10/26 BCx: NGTD   Mackie Paienee Karolyn Messing, PharmD PGY1 Pharmacy Resident Pager: [phone] 09/01/2016 3:31 PM

## 2016-09-02 ENCOUNTER — Inpatient Hospital Stay (HOSPITAL_COMMUNITY): Payer: Self-pay

## 2016-09-02 LAB — BODY FLUID CULTURE

## 2016-09-02 MED ORDER — TRAMADOL HCL 50 MG PO TABS
50.0000 mg | ORAL_TABLET | ORAL | Status: DC | PRN
Start: 2016-09-02 — End: 2016-09-03
  Administered 2016-09-02 – 2016-09-03 (×3): 50 mg via ORAL
  Filled 2016-09-02 (×3): qty 1

## 2016-09-02 NOTE — Discharge Instructions (Signed)
Thoracotomy, Care After °Refer to this sheet in the next few weeks. These instructions provide you with information on caring for yourself after your procedure. Your health care provider may also give you more specific instructions. Your treatment has been planned according to current medical practices, but problems sometimes occur. Call your health care provider if you have any problems or questions after your procedure. °WHAT TO EXPECT AFTER YOUR PROCEDURE °After your procedure, it is typical to have the following sensations: °· You may feel pain at the incision site. °· You may be constipated from the pain medicine given and the change in your level of activity. °· You may feel extremely tired. °HOME CARE INSTRUCTIONS °· Take over-the-counter or prescription medicines for pain, discomfort, or fever only as directed by your health care provider. It is very important to take pain relieving medicine before your pain becomes severe. You will be able to breathe and cough more comfortably if your pain is well controlled. °· Take deep breaths. Deep breathing helps to keep your lungs inflated and protects against a lung infection (pneumonia). °· Cough frequently. Even though coughing may cause discomfort, coughing is important to clear mucus (phlegm) and expand your lungs. Coughing helps prevent pneumonia. If it hurts to cough, hold a pillow against your chest when you cough. This may help with the discomfort. °· Continue to use an incentive spirometer as directed. The use of an incentive spirometer helps to keep your lungs inflated and protects against pneumonia. °· Change the bandages over your incision as needed or as directed by your health care provider. °· Remove the bandages over your chest tube site as directed by your health care provider. °· Resume your normal diet as directed. It is important to have adequate protein, calories, vitamins, and minerals to promote healing. °· Prevent constipation. °¨ Eat  high-fiber foods such as whole grain cereals and breads, brown rice, beans, and fresh fruits and vegetables. °¨ Drink enough water and fluids to keep your urine clear or pale yellow. Avoid drinking beverages containing caffeine. Beverages containing caffeine can cause dehydration and harden your stool. °¨ Talk to your health care provider about taking a stool softener or laxative. °· Avoid lifting until you are instructed otherwise. °· Do not drive until directed by your health care provider.  Do not drive while taking pain medicines (narcotics). °· Do not bathe, swim, or use a hot tub until directed by your health care provider. You may shower instead. Gently wash the area of your incision with water and soap as directed. Do not use anything else to clean your incision except as directed by your health care provider. °· Do not use any tobacco products including cigarettes, chewing tobacco, or electronic cigarettes. °· Avoid secondhand smoke. °· Schedule an appointment for stitch (suture) or staple removal as directed. °· Schedule and attend all follow-up visits as directed by your health care provider. It is important to keep all your appointments. °· Participate in pulmonary rehabilitation as directed by your health care provider. °· Do not travel by airplane for 2 weeks after your chest tube is removed. °SEEK MEDICAL CARE IF: °· You are bleeding from your wounds. °· Your heartbeat seems irregular. °· You have redness, swelling, or increasing pain in the wounds. °· There is pus coming from your wounds. °· There is a bad smell coming from the wound or dressing. °· You have a fever or chills. °· You have nausea or are vomiting. °· You have muscle aches. °SEEK   IMMEDIATE MEDICAL CARE IF: °· You have a rash. °· You have difficulty breathing. °· You have a reaction or side effect to medicines given. °· You have persistent nausea. °· You have lightheadedness or feel faint. °· You have shortness of breath or chest  pain. °· You have persistent pain. °  °This information is not intended to replace advice given to you by your health care provider. Make sure you discuss any questions you have with your health care provider. °  °Document Released: 04/06/2011 Document Revised: 03/08/2015 Document Reviewed: 06/10/2013 °[employer]vier Interactive Patient Education ©2016 [employer] ° °

## 2016-09-02 NOTE — Discharge Summary (Signed)
Physician Discharge Summary       [address]       [place] [id number]             [phone]    Patient ID: Bryan Jennings MRN: [id number] DOB/AGE: 1991-03-09 24 y.o.  Admit date: 08/21/2016 Discharge date: 09/07/2016  Admission Diagnoses: 1. Sepsis (HCC) 2. Community acquired pneumonia 3. Right empyema 4. Trapped right lower lobe  Active Diagnoses:  1. Polysubstance dependence including opioid type drug, episodic abuse (HCC) 2. Opiate withdrawal (HCC) 3. Essential hypertension 4. Opioid-induced mood disorder (HCC) 5. Anemia  Procedure (s):  Right video-assisted thoracoscopic surgery - mini thoracotomy for drainage of empyema and decortication of right lower Lobe by Dr. Donata ClayVan Trigt on 08/29/2016.  Pathology: Pleura, peel, Right - FIBRINOPURULENT MATERIAL. - THERE IS NO EVIDENCE OF MALIGNANCY.  History of Presenting Illness: This is a 25 year old male with a past medical history of hypertension and substance abuse including IVDA. Apparently, he overdosed on heroin 10/13 and a bystander performed CPR. He woke up with EMS by narcan and declined hospitalization at that time. He felt as though he had cracked a rib and has had chest pain. He presented to the emergency department a few days later 10/18 with a chief complaint of shortness of breath and chest pain. He was admitted to the hospitalist team for sepsis/CAP. He was treated with antibiotics. Unfortunately, he did not have much improvement in his symptoms and WBC continued to rise. He was then put on Zosyn. Chest x ray on 08/27/2016 demonstrated a large right pleural effusion. CT of the chest showed a large right pleural effusion is noted which is significantly increased in size compared to prior exam. There is adjacent subsegmental atelectasis the right upper lobe been atelectasis of the right lower lobe. Focal loculated pleural effusion is seen anteriorly and medially in the right hemi thorax adjacent to  the right cardiac border. Minimally displaced fracture involving anterior portion of right fifth rib and stable mildly enlarged mediastinal adenopathy. Dr Donata ClayVan Trigt was consulted for right VATS and drainage of empyema.  Dr. Donata ClayVan Trigt discussed the need for right VATS, drainage of empyema, and decortication. Potential risks, benefits and complications of the surgery were discussed with the patient and he agreed to proceed.  Brief Hospital Course:  The patient remained afebrile and hemodynamically stable. A line and foley were removed early in the post operative course. Chest tube output gradually decreased. Daily chest x rays were obtained and remained stable. One chest tube was removed on 10/28 and one was on 09/02/2016.  Patient is ambulating on room air. He has a history of IVDU and we tried to limit narcotics as much as possible. Patient is tolerating a diet and has had a bowel movement. Wounds are clean and dry. Final chest x ray showed Persistent volume loss on the right consistent with residual pleural fluid -empyema as well as right mid and lower lung atelectasis or pneumonia. Patient is felt surgically stable for discharge today. He is planning to stay with his brother who has been clean for 4 years. He plans to attend NA meetings weekly. He is aware that he will be given limited supply of scheduled medication and will need to follow-up with a primary care or addiction specialist for refills.   Latest Vital Signs: Blood pressure 135/72, pulse 97, temperature 98.6 F (37 C), temperature source Oral, resp. rate (!) 30, height 5\' 10"  (1.778 m), weight 252 lb 3.3 oz (114.4  kg), SpO2 94 %.  Physical Exam: Cardiovascular: RRR Pulmonary: Clear to auscultation on left and diminished left base Abdomen: Soft, non tender, bowel sounds present. Wounds: Clean and dry.  No erythema or signs of infection. Chest Tubes: to water seal, no air leak.   Discharge Condition:Stable and discharged to  home.  Recent laboratory studies:  Lab Results  Component Value Date   WBC 8.8 09/05/2016   HGB 9.5 (L) 09/05/2016   HCT 29.8 (L) 09/05/2016   MCV 88.7 09/05/2016   PLT 424 (H) 09/05/2016   Lab Results  Component Value Date   NA 139 09/07/2016   K 3.6 09/07/2016   CL 110 09/07/2016   CO2 23 09/07/2016   CREATININE 0.67 09/07/2016   GLUCOSE 86 09/07/2016    Diagnostic Studies:  Ct Chest Wo Contrast  Result Date: 08/28/2016 CLINICAL DATA:  Chest pain, shortness of breath, right-sided pleural effusion. EXAM: CT CHEST WITHOUT CONTRAST TECHNIQUE: Multidetector CT imaging of the chest was performed following the standard protocol without IV contrast. COMPARISON:  Radiograph of August 27, 2016. CT scan of August 21, 2016. FINDINGS: Cardiovascular: There is no evidence of thoracic aortic aneurysm. Normal cardiac size. Mediastinum/Nodes: Stable mildly enlarged mediastinal adenopathy is noted, which most likely is inflammatory or reactive in etiology. Lungs/Pleura: No pneumothorax is noted. Minimal left basilar subsegmental atelectasis is noted. Minimal left pleural effusion is noted. Large right pleural effusion is noted with adjacent subsegmental atelectasis of the right upper lobe and atelectasis of the right lower lobe. Focal loculated pleural effusion is seen anteriorly and medially adjacent to the right cardiac border. There is noted substantial thickening of the musculature of the right chest wall consistent with inflammation, with possible fluid collection forming in this area as well. Upper Abdomen: No definite abnormality seen in the visualized portion of the upper abdomen. Musculoskeletal: Minimally displaced fracture seen involving the anterior portion of right fifth rib, best seen on image number 87 of series 3. IMPRESSION: Minimally displaced fracture involving anterior portion of right fifth rib. Stable mildly enlarged mediastinal adenopathy is noted which most likely is inflammatory  or reactive in etiology. Large right pleural effusion is noted which is significantly increased in size compared to prior exam. There is adjacent subsegmental atelectasis the right upper lobe been atelectasis of the right lower lobe. Focal loculated pleural effusion is seen anteriorly and medially in the right hemi thorax adjacent to the right cardiac border. Substantial thickening of the musculature of the right chest wall is noted consistent with inflammation, with possible fluid collection forming in this area as well. Electronically Signed   By: Lupita Raider, M.D.   On: 08/28/2016 07:09   Ct Angio Chest Pe W/cm &/or Wo Cm  Result Date: 08/21/2016 CLINICAL DATA:  25 year old male with tachycardia and tachypnea. EXAM: CT ANGIOGRAPHY CHEST WITH CONTRAST TECHNIQUE: Multidetector CT imaging of the chest was performed using the standard protocol during bolus administration of intravenous contrast. Multiplanar CT image reconstructions and MIPs were obtained to evaluate the vascular anatomy. CONTRAST:  80 cc Isovue 370 COMPARISON:  Chest radiograph dated 08/21/2016 FINDINGS: Evaluation of this exam is limited due to respiratory motion artifact. Cardiovascular: The thoracic aorta appears unremarkable. No dissection or aneurysm. The origins of the great vessels of the aortic arch appear patent. Evaluation of the pulmonary arteries is limited due to respiratory motion artifact. No definite central pulmonary artery embolus identified. Top-normal cardiac size. No pericardial effusion. Mediastinum/Nodes: There is no hilar or mediastinal adenopathy. The esophagus is  grossly unremarkable. No thyroid nodules identified. Lungs/Pleura: There is a small right pleural effusion. Patchy area of consolidative change involving the right lung base in the right middle and right lower lobe may represent atelectasis versus pneumonia. Left lung base linear and hazy densities may be atelectatic changes or infiltrate. There is no  pleural effusion on the left. No pneumothorax. The central airways are patent. Upper Abdomen: No acute abnormality. Musculoskeletal: No chest wall abnormality. No acute or significant osseous findings. Review of the MIP images confirms the above findings. IMPRESSION: Limited evaluation for pulmonary embolism due to respiratory motion artifact. No definite central pulmonary artery embolus identified. Small right pleural effusion with right lung base atelectasis versus pneumonia. Small patchy left lung base density may represent atelectatic changes versus infiltrate. Clinical correlation and follow-up to resolution recommended. Electronically Signed   By: Elgie Collard M.D.   On: 08/21/2016 22:37   Dg Chest Port 1 View  Result Date: 09/02/2016 CLINICAL DATA:  Follow-up pneumothorax EXAM: PORTABLE CHEST 1 VIEW COMPARISON:  Chest radiograph from one day prior. FINDINGS: Right PICC terminates over the right atrium. Right chest tube terminates in the medial upper right pleural space. Stable cardiomediastinal silhouette with top-normal heart size. No pneumothorax. Stable small to moderate right pleural effusion. No left pleural effusion. Low lung volumes. Patchy right lung base opacity is stable. No overt pulmonary edema. IMPRESSION: 1. No pneumothorax. 2. Stable small to moderate subpulmonic right pleural effusion. 3. Low lung volumes. Stable patchy right lung base opacity, favor atelectasis. Electronically Signed   By: Delbert Phenix M.D.   On: 09/02/2016 09:41    Discharge Medications:   Medication List    STOP taking these medications   DULoxetine 30 MG capsule Commonly known as:  CYMBALTA   hydrochlorothiazide 25 MG tablet Commonly known as:  HYDRODIURIL   hydrOXYzine 25 MG tablet Commonly known as:  ATARAX/VISTARIL   traMADol 50 MG tablet Commonly known as:  ULTRAM     TAKE these medications   ALPRAZolam 0.25 MG tablet Commonly known as:  XANAX Take 1 tablet (0.25 mg total) by mouth 2  (two) times daily.   gabapentin 300 MG capsule Commonly known as:  NEURONTIN Take 1 capsule (300 mg total) by mouth 3 (three) times daily. For withdrawal related anxiety symptoms   ibuprofen 800 MG tablet Commonly known as:  ADVIL,MOTRIN Take 800 mg by mouth every 6 (six) hours as needed for moderate pain.   linezolid 600 MG tablet Commonly known as:  ZYVOX Take 1 tablet (600 mg total) by mouth 2 (two) times daily.   metoprolol tartrate 25 MG tablet Commonly known as:  LOPRESSOR Take 0.5 tablets (12.5 mg total) by mouth 2 (two) times daily.   Oxycodone HCl 10 MG Tabs Take 1 tablet (10 mg total) by mouth every 6 (six) hours as needed.       Follow Up Appointments: Follow-up Information    [facility] Follow up on 09/12/2016.   Why:  11:00 am for follow up apt.  Contact information: [address] [place] [address] [phone]       Mikey Bussing, MD .   Specialty:  Cardiothoracic Surgery Why:  PA/LAT CXR to be taken (at [facility] which is in the same building as Dr. Zenaida Niece Trigt's office) one hour prior to appointment time. Office will call or mail appointment date and time.  Contact information: [address] [place] [address] [phone]  Signed: Bernadette Hoitessa N ContePA-C 09/07/2016, 9:15 AM

## 2016-09-02 NOTE — Progress Notes (Addendum)
      [address]       Bryan Jennings,[place] [id number]             [phone]       4 Days Post-Op Procedure(s) (LRB): VIDEO ASSISTED THORACOSCOPY (VATS)/ DRAINAGE EMPYEMA (Right) THORACOTOMY MAJOR (Right)  Subjective: Patient continues to feel better.  Objective: Vital signs in last 24 hours: Temp:  [98 F (36.7 C)-98.8 F (37.1 C)] 98 F (36.7 C) (10/29 0653) Pulse Rate:  [84-100] 87 (10/29 0653) Cardiac Rhythm: Normal sinus rhythm (10/29 0718) Resp:  [25-34] 25 (10/29 0653) BP: (121-146)/(66-85) 146/85 (10/29 0653) SpO2:  [95 %-98 %] 96 % (10/29 0653)      Intake/Output from previous day: 10/28 0701 - 10/29 0700 In: 1470.8 [P.O.:1180; I.V.:90.8; IV Piggyback:200] Out: 0865 [[id number] [Urine:4675; Drains:20; Chest Tube:50]   Physical Exam:  Cardiovascular: RRR Pulmonary: Clear to auscultation on left and diminished left base Abdomen: Soft, non tender, bowel sounds present. Wounds: Clean and dry.  No erythema or signs of infection. Chest Tube: to water seal, no air leak.  Lab Results: CBC:  Recent Labs  08/31/16 0500 09/01/16 0415  WBC 14.5* 12.9*  HGB 9.6* 9.8*  HCT 29.9* 30.5*  PLT 425* 501*   BMET:   Recent Labs  08/31/16 0500 09/01/16 0415  NA 138 138  K 3.7 4.2  CL 104 104  CO2 26 26  GLUCOSE 105* 95  BUN 11 10  CREATININE 0.99 0.72  CALCIUM 8.0* 8.2*    PT/INR: No results for input(s): LABPROT, INR in the last 72 hours. ABG:  INR: Will add last result for INR, ABG once components are confirmed Will add last 4 CBG results once components are confirmed  Assessment/Plan:  1. CV - SR in the 90's. 2.  Pulmonary - On room air. Chest tube with 50 cc of output last 24 hours. Chest tube is to water seal. There is no air leak. CXR this am appears stable. Will remove chest tube. Encourage incentive spirometer. Check CXR in am 3. ID-On Vanco for right empyema.  Right pleural fluid few MRSA and no anaerobes.  4. Anemia-H and H stable at 9.8 and  30.5 5. History of IVDU  ZIMMERMAN,Bryan Jennings MPA-C 09/02/2016,8:55 AM  minimial drainage from tube  D/c today I have seen and examined Bryan Jennings and agree with the above assessment  and plan.  Delight OvensEdward B Levander Katzenstein MD [place] [phone] Office [phone] 09/02/2016 10:27 AM

## 2016-09-02 NOTE — Progress Notes (Signed)
Removed chest tube from right flank. Pt tolerated well

## 2016-09-03 ENCOUNTER — Encounter (HOSPITAL_COMMUNITY): Payer: Self-pay | Admitting: *Deleted

## 2016-09-03 ENCOUNTER — Inpatient Hospital Stay (HOSPITAL_COMMUNITY): Payer: Self-pay

## 2016-09-03 LAB — AEROBIC/ANAEROBIC CULTURE W GRAM STAIN (SURGICAL/DEEP WOUND)

## 2016-09-03 LAB — VANCOMYCIN, TROUGH: Vancomycin Tr: 19 ug/mL (ref 15–20)

## 2016-09-03 MED ORDER — IOPAMIDOL (ISOVUE-300) INJECTION 61%
INTRAVENOUS | Status: AC
  Administered 2016-09-03: 75 mL
  Filled 2016-09-03: qty 75

## 2016-09-03 MED ORDER — ALPRAZOLAM 0.5 MG PO TABS
0.5000 mg | ORAL_TABLET | Freq: Two times a day (BID) | ORAL | Status: DC | PRN
  Administered 2016-09-03 – 2016-09-07 (×8): 0.5 mg via ORAL
  Filled 2016-09-03 (×8): qty 1

## 2016-09-03 MED ORDER — ZOLPIDEM TARTRATE 5 MG PO TABS
5.0000 mg | ORAL_TABLET | Freq: Every evening | ORAL | Status: DC | PRN
  Administered 2016-09-03: 5 mg via ORAL
  Filled 2016-09-03: qty 1

## 2016-09-03 MED ORDER — KETOROLAC TROMETHAMINE 10 MG PO TABS
10.0000 mg | ORAL_TABLET | Freq: Four times a day (QID) | ORAL | Status: DC | PRN
  Administered 2016-09-03 – 2016-09-07 (×10): 10 mg via ORAL
  Filled 2016-09-03 (×13): qty 1

## 2016-09-03 NOTE — Progress Notes (Signed)
Pharmacy Antibiotic Note  Bryan Jennings is a 25 y.o. male admitted on 08/21/2016 with empyema s/p R VATS on 10/24.  History of IVDA  Pharmacy has been consulted for vancomycin dosing.   Vancomycin trough therapeutic tonight at 19  Plan: Continue Vancomycin 1250 mg IV q8h   Height: 5\' 10"  (177.8 cm) Weight: 252 lb 3.3 oz (114.4 kg) IBW/kg (Calculated) : 73  Temp (24hrs), Avg:98.4 F (36.9 C), Min:98.1 F (36.7 C), Max:98.9 F (37.2 C)   Recent Labs Lab 08/29/16 0802 08/29/16 0930 08/29/16 1505 08/30/16 0415 08/30/16 1839 08/31/16 0500 09/01/16 0415 09/01/16 1430 09/03/16 2212  WBC  --  21.0* 18.9* 14.3*  --  14.5* 12.9*  --   --   CREATININE 0.85  --   --  0.81 0.90 0.99 0.72  --   --   VANCOTROUGH  --   --   --   --   --   --   --  11* 19    Estimated Creatinine Clearance: 178.9 mL/min (by C-G formula based on SCr of 0.72 mg/dL).    No Known Allergies   Geannie RisenGreg Saabir Blyth, PharmD, BCPS  09/03/2016 11:35 PM

## 2016-09-03 NOTE — Care Management Note (Addendum)
Case Management Note  Patient Details  Name: Bryan Jennings MRN: [id number] Date of Birth: 1991-03-27  Subjective/Objective:   pod5 vats  Drainage empyema, thoracotomy, hx if IVDA, has a picc, duration of iv abx to be determine, patient may need to go to snf if need long term iv abx, CSW aware.  Need to determine if can be po or use oritavancin Iv for one time dose.  Patient does not have a pcp, or any insurance.  He had a Programmer, systemsMatch letter from previous NCM but has expired, if he goes home with po 's will need updated Match letter and an apt with [facility] clinc.  He has transportation at dc per patient if he goes home at dc.  NCM will cont to follow for dc needs.  10/31 Letha Capeeborah Raun Routh RN, BSN - NCM spoke with Julian Hyessa PA, she states she is consulting ID to get definite dates for iv abx probably will be 4-6 weeks but will need ID to see, [place] CSW is working on SNF for patient.  NCM will cont to follow for dc needs.                 Action/Plan:   Expected Discharge Date:                  Expected Discharge Plan:  Skilled Nursing Facility  In-House Referral:  Clinical Social Work  Discharge planning Services  CM Consult, Indigent [facility] Clinic, [facility] Program, Medication Assistance  Post Acute Care Choice:    Choice offered to:  Patient  DME Arranged:    DME Agency:     HH Arranged:    HH Agency:     Status of Service:  Completed, signed off  If discussed at [employer] Meetings, dates discussed:    Additional Comments:  Leone Havenaylor, Aliviana Burdell Clinton, RN 09/03/2016, 11:20 AM

## 2016-09-03 NOTE — Progress Notes (Signed)
Name: Bryan Jennings MRN: [id number] DOB: Aug 11, 1991    ADMISSION DATE:  08/21/2016 CONSULTATION DATE:  08/27/2016  REFERRING MD :  Dr. Randel Pigg  CHIEF COMPLAINT:  Effusion  HISTORY OF PRESENT ILLNESS:  25 year old male with PMH of HTN and substance abuse including IVDA. Apparently he overdosed on heroin 10/13 and a bystander performed CPR. He woke up with EMS by narcan and declined hospitalization at that time. He felt as though he had cracked a rib and has had chest pain. He presented to the emergency department a few days later 10/18 with CC SOB and chest pain. He was admitted to the hospitalist team for sepsis/CAP. He was treated with CAP antibiotics. Unfortunately he did not have much improvement in his symptoms and WBC continued to rise. He was broadened to Zosyn. 10/23 CXR demonstrated large R pleural effusion. Pulmonary consulted for possible thora.  Ultimately found to have an empyema and required a R VATS.   SIGNIFICANT EVENTS  10/13  CPR/ Narcan for Heroin OD > Refused hospitalization at time 10/18  Admit for SOB/ CP > Sepsis/ CAP 10/25  R VATS,intubated.  10/26  Extubated 10/29  CT d/c'd   STUDIES:  CT chest 10/17 >> Limited evaluation for pulmonary embolism due to respiratory motion artifact. No definite central pulmonary artery embolus identified. Small right pleural effusion with right lung base atelectasis versus pneumonia. Small patchy left lung base density may represent atelectatic changes versus infiltrate.  CT chest 10/23 >>Minimally displaced fracture  anterior portion of right fifth rib.Stable mildly enlarged mediastinal adenopathy  Most likely is inflammatory or reactive in etiology.Large right pleural effusion  is significantly increased in size compared to prior exam. There is adjacent subsegmental atelectasis the right upper lobe and   right lower lobe. Focal loculated pleural effusion is seen anteriorly and medially in the right hemi thorax adjacent to the  right cardiac border. Substantial thickening of the musculature of the right chest wall is noted consistent with inflammation, with possible fluid collection forming in this area as well. ECHO 10/26 >> no vegetations. Normal EF, trivial pericardial effusion.   CULTURES: Sputum 10/25 >>  AFB 10/25 >>  BCx2 10/26 >>   SUBJECTIVE:   VITAL SIGNS: Temp:  [97.3 F (36.3 C)-98.4 F (36.9 C)] 98.4 F (36.9 C) (10/30 0721) Pulse Rate:  [86-110] 86 (10/30 0721) Resp:  [23-39] 32 (10/30 0721) BP: (133-144)/(73-83) 138/79 (10/30 0721) SpO2:  [92 %-95 %] 95 % (10/30 0721)  PHYSICAL EXAMINATION: General:  Young male in NAD, awake, follows commands. Neuro:  Alert, awake  HEENT:  [place]/AT, PERRL, no JVD Cardiovascular:  RRR, no MRG Lungs:  Diminished BS on right side.  Abdomen:  Soft, non-tender Musculoskeletal:  No acute deformity or ROM limitation Skin:  Grossly intact   Recent Labs Lab 08/30/16 0415 08/30/16 1839 08/31/16 0500 09/01/16 0415  NA 138 139 138 138  K 4.4 3.7 3.7 4.2  CL 105 101 104 104  CO2 27  --  26 26  BUN 11 10 11 10   CREATININE 0.81 0.90 0.99 0.72  GLUCOSE 91 126* 105* 95    Recent Labs Lab 08/30/16 0415 08/30/16 1839 08/31/16 0500 09/01/16 0415  HGB 9.1* 12.2* 9.6* 9.8*  HCT 28.6* 36.0* 29.9* 30.5*  WBC 14.3*  --  14.5* 12.9*  PLT 265  --  425* 501*   Dg Chest 2 View  Result Date: 09/03/2016 CLINICAL DATA:  Chest tube. EXAM: CHEST  2 VIEW COMPARISON:  08/13/2016. FINDINGS:  Interim removal of right chest tube. Right PICC line in stable position. Heart size stable. Low lung volumes with persistent right base pulmonary infiltrate and right pleural effusion. No pneumothorax. No acute bony abnormality. IMPRESSION: 1.  Interim removal of right chest tube.  No pneumothorax.  2. 2. Low lung volumes with persistent right base infiltrate and right pleural effusion. Electronically Signed   By: Maisie Fushomas  Register   On: 09/03/2016 07:20   Dg Chest Port 1  View  Result Date: 09/02/2016 CLINICAL DATA:  Follow-up pneumothorax EXAM: PORTABLE CHEST 1 VIEW COMPARISON:  Chest radiograph from one day prior. FINDINGS: Right PICC terminates over the right atrium. Right chest tube terminates in the medial upper right pleural space. Stable cardiomediastinal silhouette with top-normal heart size. No pneumothorax. Stable small to moderate right pleural effusion. No left pleural effusion. Low lung volumes. Patchy right lung base opacity is stable. No overt pulmonary edema. IMPRESSION: 1. No pneumothorax. 2. Stable small to moderate subpulmonic right pleural effusion. 3. Low lung volumes. Stable patchy right lung base opacity, favor atelectasis. Electronically Signed   By: Delbert PhenixJason A Poff M.D.   On: 09/02/2016 09:41   Dg Chest Port 1 View  Result Date: 09/01/2016 CLINICAL DATA:  Right chest pain, pneumothorax EXAM: PORTABLE CHEST 1 VIEW COMPARISON:  09/01/2016 FINDINGS: Stable right chest tube.  No pneumothorax is seen. Small to moderate right pleural effusion. Associated patchy right mid/lower lung opacities, likely atelectasis. Mild left basilar atelectasis. Heart is top-normal in size. Right arm PICC terminates in the upper right atrium. IMPRESSION: Stable right chest tube.  No pneumothorax is seen. Small to moderate right pleural effusion. Associated patchy right mid/lower lung opacities, likely atelectasis. Mild left basilar atelectasis. Right arm PICC terminates in the upper right atrium. Electronically Signed   By: Charline BillsSriyesh  Krishnan M.D.   On: 09/01/2016 19:02    ASSESSMENT / PLAN:  25 y.o. male with history of substance and IV drug use with large R empyema likely r/t rib fx in setting bystander CPR after drug OD.  Remains on vent post R VATS 10/25.   Right empyema s/p VATS 10/25 --  Likely traumatic, could be infectious in a IVDA patient vs untreated PNA as recently had CAP.  Cell count 1950 WBC, 32% neutrophil, 62% monocyte-macrophage. ECHO negative for  vegetation.   Plan: F/u cultures.  Continue vancomycin, D6/x  Completed zosyn x7 days Monitor fever curve / WBC trend   Acute hypoxic resp failure 2/2 to R empyema  Plan: Wean O2 for sats > 92% Pulmonary hygiene - IS, mobilize. Lasix as renal function / BP permit  Recently treated CAP now with empyema (traumatic vs infectious)  Plan: Cont abx for now Repeat CT chest 10/30  Rib fracture  Oxycodone Q4 PRN, will need some opiate in system due chronic IVDU.  Minimize sedating medications as able  D/C ultram  PRN Toradol for chest pain > monitor renal function closely  Voltaren gel.   Heroine abuse Last used 2 weeks PTA, tried to quit in the past. [facility] is discharged. Wants to be on suboxone. We discussed the risk of using IVDU and he understands it can be lethal.Social work assisting him with resources.     Canary BrimBrandi Ollis, NP-C [facility] Pgr: [phone] or if no answer [phone] 09/03/2016, 10:38 AM   Attending note: I have seen and examined the patient with nurse practitioner/resident and agree with the note. History, labs and imaging reviewed.  25 Y/o with HTN, IVDA, OD on heroin, CAP rt empyema. S/p VATS MRSA in the cultures Doing well Still on vancomycin Pain control with toradol Follow renal function.  Rest of plan as above.  Chilton GreathousePraveen Dajon Lazar MD [facility] Pager [phone] If no answer or after 3pm call: [phone] 09/03/2016, 1:24 PM

## 2016-09-03 NOTE — Progress Notes (Addendum)
[address]       [place] [address]             [phone]      5 Days Post-Op Procedure(s) (LRB): VIDEO ASSISTED THORACOSCOPY (VATS)/ DRAINAGE EMPYEMA (Right) THORACOTOMY MAJOR (Right) Subjective: Feeling better, mild DOE  Objective: Vital signs in last 24 hours: Temp:  [97.3 F (36.3 C)-98.4 F (36.9 C)] 98.4 F (36.9 C) (10/30 0721) Pulse Rate:  [86-110] 86 (10/30 0721) Cardiac Rhythm: Sinus tachycardia (10/30 0800) Resp:  [23-39] 32 (10/30 0721) BP: (133-144)/(73-83) 138/79 (10/30 0721) SpO2:  [92 %-95 %] 95 % (10/30 0721)  Hemodynamic parameters for last 24 hours:    Intake/Output from previous day: 10/29 0701 - 10/30 0700 In: 1830 [P.O.:1080; IV Piggyback:750] Out: 2500 [Urine:2500] Intake/Output this shift: Total I/O In: 260 [I.V.:10; IV Piggyback:250] Out: 300 [Urine:300]  General appearance: alert, cooperative and no distress Heart: regular rate and rhythm Lungs: dim right>left base Abdomen: benign Extremities: no edema Wound: incis healing well  Lab Results:  Recent Labs  09/01/16 0415  WBC 12.9*  HGB 9.8*  HCT 30.5*  PLT 501*   BMET:  Recent Labs  09/01/16 0415  NA 138  K 4.2  CL 104  CO2 26  GLUCOSE 95  BUN 10  CREATININE 0.72  CALCIUM 8.2*    PT/INR: No results for input(s): LABPROT, INR in the last 72 hours. ABG    Component Value Date/Time   PHART 7.384 08/30/2016 0515   HCO3 25.5 08/30/2016 0515   TCO2 25 08/30/2016 1839   ACIDBASEDEF 2.0 08/29/2016 1230   O2SAT 98.7 08/30/2016 0515   CBG (last 3)   Recent Labs  08/31/16 2156  GLUCAP 93   Results for orders placed or performed during the hospital encounter of 08/21/16  Blood Culture (routine x 2)     Status: None   Collection Time: 08/21/16 10:35 PM  Result Value Ref Range Status   Specimen Description BLOOD LEFT ANTECUBITAL  Final   Special Requests BOTTLES DRAWN AEROBIC AND ANAEROBIC 5CC  Final   Culture NO GROWTH 5 DAYS  Final   Report Status 08/26/2016 FINAL  Final  Blood Culture (routine x 2)     Status: None   Collection Time: 08/21/16 10:40 PM  Result Value Ref Range Status   Specimen Description BLOOD LEFT HAND  Final   Special Requests BOTTLES DRAWN AEROBIC AND ANAEROBIC 5CC  Final   Culture NO GROWTH 5 DAYS  Final   Report Status 08/26/2016 FINAL  Final  Urine culture     Status: None   Collection Time: 08/22/16  2:40 AM  Result Value Ref Range Status   Specimen Description URINE, RANDOM  Final   Special Requests NONE  Final   Culture NO GROWTH 1 DAY  Final   Report Status 08/23/2016 FINAL  Final  Surgical pcr screen     Status: None   Collection Time: 08/29/16 12:20 AM  Result Value Ref Range Status   MRSA, PCR NEGATIVE NEGATIVE Final   Staphylococcus aureus NEGATIVE NEGATIVE Final    Comment:        The Xpert SA Assay (FDA approved for NASAL specimens in patients over 1 years of age), is one component of a comprehensive surveillance program.  Test performance has been validated by [facility] for patients greater than or equal to 16 year old. It is not intended to diagnose infection nor to guide or monitor treatment.   Body  fluid culture     Status: None   Collection Time: 08/29/16 11:17 AM  Result Value Ref Range Status   Specimen Description FLUID RIGHT PLEURAL  Final   Special Requests POF ZINACEF ZOSYN AND CLINDAMYCIN  Final   Gram Stain   Final    MODERATE WBC PRESENT, PREDOMINANTLY PMN NO ORGANISMS SEEN    Culture   Final    RARE METHICILLIN RESISTANT STAPHYLOCOCCUS AUREUS CRITICAL RESULT CALLED TO, READ BACK BY AND VERIFIED WITH: CKathlene November, NURSE AT 1018 ON [id number] BY Lucienne Capers    Report Status 09/02/2016 FINAL  Final   Organism ID, Bacteria METHICILLIN RESISTANT STAPHYLOCOCCUS AUREUS  Final      Susceptibility   Methicillin resistant staphylococcus aureus - MIC*    CIPROFLOXACIN >=8 RESISTANT Resistant     ERYTHROMYCIN <=0.25 SENSITIVE Sensitive     GENTAMICIN <=0.5  SENSITIVE Sensitive     OXACILLIN >=4 RESISTANT Resistant     TETRACYCLINE <=1 SENSITIVE Sensitive     VANCOMYCIN <=0.5 SENSITIVE Sensitive     TRIMETH/SULFA <=10 SENSITIVE Sensitive     CLINDAMYCIN <=0.25 SENSITIVE Sensitive     RIFAMPIN <=0.5 SENSITIVE Sensitive     Inducible Clindamycin NEGATIVE Sensitive     * RARE METHICILLIN RESISTANT STAPHYLOCOCCUS AUREUS  Fungus Stain     Status: None   Collection Time: 08/29/16 11:17 AM  Result Value Ref Range Status   FUNGUS STAIN Final report  Final    Comment: (NOTE) Performed At: [facility] [address] [place], [place] [id number] Mila Homer MD WU:[id number]    Fungal Source FLUID  Final    Comment: PLEURAL RIGHT POF ZINACEF ZOSYN AND CLINDAMYCIN   Acid Fast Smear (AFB)     Status: None   Collection Time: 08/29/16 11:17 AM  Result Value Ref Range Status   AFB Specimen Processing Concentration  Final   Acid Fast Smear Negative  Final    Comment: (NOTE) Performed At: [facility] [address] [place], [place] [id number] Mila Homer MD VH:[id number]    Source (AFB) FLUID  Final    Comment: RIGHT PLEURAL POF ZINACEF ZOSYN AND CLINDAMYCIN   Fungal Stain reflex     Status: None   Collection Time: 08/29/16 11:17 AM  Result Value Ref Range Status   Fungal stain result 1 Comment  Final    Comment: (NOTE) KOH/Calcofluor preparation:  no fungus observed. Performed At: [facility] [address] [place], [place] [id number] Mila Homer MD UV:[id number]   Aerobic/Anaerobic Culture (surgical/deep wound)     Status: None (Preliminary result)   Collection Time: 08/29/16 11:28 AM  Result Value Ref Range Status   Specimen Description TISSUE RIGHT PLEURAL  Final   Special Requests POF ZINACEF ZOSYN AND CLINDAMYCIN  Final   Gram Stain   Final    FEW WBC PRESENT, PREDOMINANTLY PMN RARE GRAM NEGATIVE RODS RARE GRAM POSITIVE COCCI IN CLUSTERS IN PAIRS    Culture   Final    FEW METHICILLIN  RESISTANT STAPHYLOCOCCUS AUREUS NO ANAEROBES ISOLATED; CULTURE IN PROGRESS FOR 5 DAYS    Report Status PENDING  Incomplete   Organism ID, Bacteria METHICILLIN RESISTANT STAPHYLOCOCCUS AUREUS  Final      Susceptibility   Methicillin resistant staphylococcus aureus - MIC*    CIPROFLOXACIN >=8 RESISTANT Resistant     ERYTHROMYCIN <=0.25 SENSITIVE Sensitive     GENTAMICIN <=0.5 SENSITIVE Sensitive     OXACILLIN >=4 RESISTANT Resistant     TETRACYCLINE <=1 SENSITIVE Sensitive  VANCOMYCIN <=0.5 SENSITIVE Sensitive     TRIMETH/SULFA <=10 SENSITIVE Sensitive     CLINDAMYCIN <=0.25 SENSITIVE Sensitive     RIFAMPIN <=0.5 SENSITIVE Sensitive     Inducible Clindamycin NEGATIVE Sensitive     * FEW METHICILLIN RESISTANT STAPHYLOCOCCUS AUREUS  Culture, respiratory (NON-Expectorated)     Status: None   Collection Time: 08/29/16  4:22 PM  Result Value Ref Range Status   Specimen Description TRACHEAL ASPIRATE  Final   Special Requests Normal  Final   Gram Stain   Final    ABUNDANT WBC PRESENT, PREDOMINANTLY PMN NO ORGANISMS SEEN    Culture Consistent with normal respiratory flora.  Final   Report Status 09/01/2016 FINAL  Final  Culture, blood (routine x 2)     Status: None (Preliminary result)   Collection Time: 08/30/16 11:30 AM  Result Value Ref Range Status   Specimen Description BLOOD RIGHT ARM  Final   Special Requests IN PEDIATRIC BOTTLE 1CC  Final   Culture NO GROWTH 3 DAYS  Final   Report Status PENDING  Incomplete  Culture, blood (routine x 2)     Status: None (Preliminary result)   Collection Time: 08/30/16 12:55 PM  Result Value Ref Range Status   Specimen Description BLOOD RIGHT THUMB  Final   Special Requests IN PEDIATRIC BOTTLE  1.5CC  Final   Culture NO GROWTH 3 DAYS  Final   Report Status PENDING  Incomplete      Meds Scheduled Meds: . acetaminophen  1,000 mg Oral Q6H   Or  . acetaminophen (TYLENOL) oral liquid 160 mg/5 mL  1,000 mg Oral Q6H  . chlorhexidine  15  mL Mouth Rinse BID  . DULoxetine  60 mg Oral Daily  . enoxaparin (LOVENOX) injection  40 mg Subcutaneous Q24H  . furosemide  20 mg Intravenous BID  . gabapentin  300 mg Per Tube TID  . mouth rinse  15 mL Mouth Rinse q12n4p  . senna  1 tablet Oral BID  . sodium chloride flush  10-40 mL Intracatheter Q12H  . vancomycin  1,250 mg Intravenous Q8H   Continuous Infusions: . sodium chloride 10 mL/hr at 08/31/16 0700   PRN Meds:.albuterol, midazolam, ondansetron (ZOFRAN) IV, oxyCODONE, sodium chloride flush, traMADol  Xrays Dg Chest 2 View  Result Date: 09/03/2016 CLINICAL DATA:  Chest tube. EXAM: CHEST  2 VIEW COMPARISON:  08/13/2016. FINDINGS: Interim removal of right chest tube. Right PICC line in stable position. Heart size stable. Low lung volumes with persistent right base pulmonary infiltrate and right pleural effusion. No pneumothorax. No acute bony abnormality. IMPRESSION: 1.  Interim removal of right chest tube.  No pneumothorax.  2. 2. Low lung volumes with persistent right base infiltrate and right pleural effusion. Electronically Signed   By: Maisie Fushomas  Register   On: 09/03/2016 07:20   Dg Chest Port 1 View  Result Date: 09/02/2016 CLINICAL DATA:  Follow-up pneumothorax EXAM: PORTABLE CHEST 1 VIEW COMPARISON:  Chest radiograph from one day prior. FINDINGS: Right PICC terminates over the right atrium. Right chest tube terminates in the medial upper right pleural space. Stable cardiomediastinal silhouette with top-normal heart size. No pneumothorax. Stable small to moderate right pleural effusion. No left pleural effusion. Low lung volumes. Patchy right lung base opacity is stable. No overt pulmonary edema. IMPRESSION: 1. No pneumothorax. 2. Stable small to moderate subpulmonic right pleural effusion. 3. Low lung volumes. Stable patchy right lung base opacity, favor atelectasis. Electronically Signed   By: Jannifer RodneyJason A Poff M.D.  On: 09/02/2016 09:41   Dg Chest Port 1 View  Result Date:  09/01/2016 CLINICAL DATA:  Right chest pain, pneumothorax EXAM: PORTABLE CHEST 1 VIEW COMPARISON:  09/01/2016 FINDINGS: Stable right chest tube.  No pneumothorax is seen. Small to moderate right pleural effusion. Associated patchy right mid/lower lung opacities, likely atelectasis. Mild left basilar atelectasis. Heart is top-normal in size. Right arm PICC terminates in the upper right atrium. IMPRESSION: Stable right chest tube.  No pneumothorax is seen. Small to moderate right pleural effusion. Associated patchy right mid/lower lung opacities, likely atelectasis. Mild left basilar atelectasis. Right arm PICC terminates in the upper right atrium. Electronically Signed   By: Charline BillsSriyesh  Krishnan M.D.   On: 09/01/2016 19:02    Assessment/Plan: S/P Procedure(s) (LRB): VIDEO ASSISTED THORACOSCOPY (VATS)/ DRAINAGE EMPYEMA (Right) THORACOTOMY MAJOR (Right)  1 steady progress 2 on IV abx for MRSA empyema- timing of duration to be determined, if prolonged will need facility that can manage PICC with IVDA hx   LOS: 13 days    GOLD,WAYNE E 09/03/2016  CT scan of chest personally reviewed showing significant improvement in loculated empyema with some residual atelectasis at right base Patient will not need further drainage but increased ambulation and improve nutrition Surgical incisions clean and dry IV antibiotics for bacteremia as recommended by ID

## 2016-09-04 LAB — CULTURE, BLOOD (ROUTINE X 2)
Culture: NO GROWTH
Culture: NO GROWTH

## 2016-09-04 MED ORDER — METOPROLOL TARTRATE 12.5 MG HALF TABLET
12.5000 mg | ORAL_TABLET | Freq: Two times a day (BID) | ORAL | Status: DC
  Administered 2016-09-04 – 2016-09-07 (×6): 12.5 mg via ORAL
  Filled 2016-09-04 (×7): qty 1

## 2016-09-04 NOTE — Progress Notes (Signed)
Name: Bryan CageJames Pointer MRN: [id number] DOB: 10-19-1991    ADMISSION DATE:  08/21/2016 CONSULTATION DATE:  08/27/2016  REFERRING MD :  Dr. Randel PiggSilva Zapata  CHIEF COMPLAINT:  Effusion  HISTORY OF PRESENT ILLNESS:  25 year old male with PMH of HTN and substance abuse including IVDA. Apparently he overdosed on heroin 10/13 and a bystander performed CPR. He woke up with EMS by narcan and declined hospitalization at that time. He felt as though he had cracked a rib and has had chest pain. He presented to the emergency department a few days later 10/18 with CC SOB and chest pain. He was admitted to the hospitalist team for sepsis/CAP. He was treated with CAP antibiotics. Unfortunately he did not have much improvement in his symptoms and WBC continued to rise. He was broadened to Zosyn. 10/23 CXR demonstrated large R pleural effusion. Pulmonary consulted for possible thora.  Ultimately found to have an empyema and required a R VATS.    SUBJECTIVE: Pt reports pain at surgical site - "feels like a bone is sticking out".  Some relief from tramadol.   VITAL SIGNS: Temp:  [98.1 F (36.7 C)-98.9 F (37.2 C)] 98.4 F (36.9 C) (10/31 0700) Pulse Rate:  [87-111] 111 (10/31 0700) Resp:  [31-34] 32 (10/31 0700) BP: (126-143)/(73-91) 126/73 (10/31 0700) SpO2:  [93 %-94 %] 94 % (10/31 0700)  PHYSICAL EXAMINATION: General:  Young male in NAD, awake, follows commands. Neuro:  Alert, awake  HEENT:  [place]/AT, PERRL, no JVD Cardiovascular:  RRR, no MRG Lungs:  Diminished BS on right side, clear on L, surgical sites c/d/i, well approximated.  Abdomen:  Soft, non-tender Musculoskeletal:  No acute deformity or ROM limitation Skin:  Grossly intact   Recent Labs Lab 08/30/16 0415 08/30/16 1839 08/31/16 0500 09/01/16 0415  NA 138 139 138 138  K 4.4 3.7 3.7 4.2  CL 105 101 104 104  CO2 27  --  26 26  BUN 11 10 11 10   CREATININE 0.81 0.90 0.99 0.72  GLUCOSE 91 126* 105* 95    Recent Labs Lab  08/30/16 0415 08/30/16 1839 08/31/16 0500 09/01/16 0415  HGB 9.1* 12.2* 9.6* 9.8*  HCT 28.6* 36.0* 29.9* 30.5*  WBC 14.3*  --  14.5* 12.9*  PLT 265  --  425* 501*   Dg Chest 2 View  Result Date: 09/03/2016 CLINICAL DATA:  Chest tube. EXAM: CHEST  2 VIEW COMPARISON:  08/13/2016. FINDINGS: Interim removal of right chest tube. Right PICC line in stable position. Heart size stable. Low lung volumes with persistent right base pulmonary infiltrate and right pleural effusion. No pneumothorax. No acute bony abnormality. IMPRESSION: 1.  Interim removal of right chest tube.  No pneumothorax.  2. 2. Low lung volumes with persistent right base infiltrate and right pleural effusion. Electronically Signed   By: Maisie Fushomas  Register   On: 09/03/2016 07:20   Ct Chest W Contrast  Result Date: 09/03/2016 CLINICAL DATA:  Followup right lung pneumonia EXAM: CT CHEST WITH CONTRAST TECHNIQUE: Multidetector CT imaging of the chest was performed during intravenous contrast administration. CONTRAST:  75mL ISOVUE-300 IOPAMIDOL (ISOVUE-300) INJECTION 61% COMPARISON:  08/27/2016 FINDINGS: Cardiovascular: Normal heart size.  No pericardial effusion. Mediastinum/Nodes: The trachea appears patent and is midline. Normal appearance of the esophagus. Numerous prominent mediastinal lymph nodes are identified. Right paratracheal node measures 11 mm, image number 46 of series 2. Previously this measured the same. Pre-vascular lymph node measures 1 cm, image 35 of series 2. Previously 8 mm. There is  a sub- carinal lymph node which measures 1.2 cm, image 65 of series 2. Previously 7 mm. Lungs/Pleura: The moderate volume loculated right pleural effusion is decreased in volume when compared with study from 08/27/2016. Gas within the pleural space is identified which may be related to recent chest tube and/or infection. There is also gas identified within the lateral right chest wall which is likely related to instrumentation. Pleural  thickening along the major and minor fissures of the right lung noted. Left lower lobe airspace consolidation identified compatible with persistent pneumonia. Subsegmental areas of atelectasis noted within the left lower lobe, superior segment of right lower lobe and right middle lobe. Gas Upper Abdomen: No acute abnormality. Musculoskeletal: Stable appearance of displaced fracture involving the anterior aspect of the right fifth rib, image 83 of series 2. IMPRESSION: 1. Persistent loculated right pleural effusion is decreased in volume from the previous exam. 2. Right lower lobe airspace consolidation compatible with residual pneumonia. 3. Multifocal areas of lower lobe predominant subsegmental atelectasis noted involving both lungs. 4. Progression of mediastinal adenopathy, likely reactive. 5. Stable appearance of right anterior rib fracture Electronically Signed   By: Signa Kellaylor  Stroud M.D.   On: 09/03/2016 15:45   SIGNIFICANT EVENTS  10/13  CPR/ Narcan for Heroin OD > Refused hospitalization at time 10/18  Admit for SOB/ CP > Sepsis/ CAP 10/25  R VATS,intubated.  10/26  Extubated 10/29  CT d/c'd   STUDIES:  CT chest 10/17 >> Limited evaluation for pulmonary embolism due to respiratory motion artifact. No definite central pulmonary artery embolus identified. Small right pleural effusion with right lung base atelectasis versus pneumonia. Small patchy left lung base density may represent atelectatic changes versus infiltrate.  CT chest 10/23 >> Minimally displaced fracture  anterior portion of right fifth rib.Stable mildly enlarged mediastinal adenopathy  Most likely is inflammatory or reactive in etiology.Large right pleural effusion  is significantly increased in size compared to prior exam. There is adjacent subsegmental atelectasis the right upper lobe and   right lower lobe. Focal loculated pleural effusion is seen anteriorly and medially in the right hemi thorax adjacent to the right cardiac border.  Substantial thickening of the musculature of the right chest wall is noted consistent with inflammation, with possible fluid collection forming in this area as well. ECHO 10/26 >> no vegetations. Normal EF, trivial pericardial effusion. CT Chest 10/30 >> persistent loculated R pleural effusion, decreased in volume from prior, RLL consolidation with residual PNA, multifocal areas of LLL atelectasis, R anterior R fracture  CULTURES: Sputum 10/25 >> normal flora AFB 10/25 >>  Fungus 10/25 >> neg prelim >>  R Pleural Tissue 10/25 >> MRSA R Pleural Fluid 10/25 >> MRSA BCx2 10/26 >>   ASSESSMENT / PLAN:  25 y.o. male with history of substance and IV drug use with large R empyema likely r/t rib fx in setting bystander CPR after drug OD.  Remains on vent post R VATS 10/25.   Right empyema s/p VATS 10/25 --  Likely traumatic, could be infectious in a IVDA patient vs untreated PNA as recently had CAP.  Cell count 1950 WBC, 32% neutrophil, 62% monocyte-macrophage. ECHO negative for vegetation.  Repeat CT eval 10/30 with residual loculated effusion / PNA  Plan: F/u cultures.  Continue vancomycin, D7/x  May need prolonged abx therapy  Completed zosyn x7 days Monitor fever curve / WBC trend  Trend CXR intermittently > monitor for reaccumulation  Acute hypoxic resp failure 2/2 to R empyema  Plan: Pulmonary hygiene -  IS, mobilize. Lasix as renal function / BP permit  Recently treated CAP now with empyema (traumatic vs infectious)  Plan: Cont abx for now  Rib fracture  Oxycodone Q4 PRN, will need some opiate in system due chronic IVDU.  Minimize sedating medications as able  PRN Toradol for chest pain > monitor renal function closely  Voltaren gel.   Heroine abuse Last used 2 weeks PTA, tried to quit in the past. Plans to go to [facility] once he is discharged. Wants to be on suboxone. We discussed the risk of using IVDU and he understands it can be lethal.Social work assisting  him with resources.     Canary Brim, NP-C [facility] Pgr: [phone] or if no answer [phone] 09/04/2016, 10:08 AM    Attending note: I have seen and examined the patient with nurse practitioner/resident and agree with the note. History, labs and imaging reviewed.  25 Y/o with HTN, IVDA, OD on heroin, CAP rt empyema. S/p VATS MRSA in the cultures Doing well Still on vancomycin. Plan for 1 more week per CVTS Pain control with toradol Follow renal function.  Rest of plan as above. PCCM will sign off. Please call [facility] for any medical questions.   Chilton Greathouse MD [facility] Pager [phone] If no answer or after 3pm call: [phone] 09/03/2016, 1:24 PM

## 2016-09-04 NOTE — Clinical Social Work Note (Signed)
Clinical Social Work Assessment  Patient Details  Name: [place] MRN: [id number] Date of Birth: 07-Mar-1991  Date of referral:  09/04/16               Reason for consult:  Facility Placement, Insurance Barriers, Substance Use/ETOH Abuse                Permission sought to share information with:  Facility Medical sales representativeContact Representative, Family Supports Permission granted to share information::  Yes, Verbal Permission Granted  Name::     [place] (Brother) [phone]  Agency::  Skilled Nursing Facilities  Relationship::     Contact Information:     Housing/Transportation Living arrangements for the past 2 months:  Apartment Source of Information:  Patient Patient Interpreter Needed:  None Criminal Activity/Legal Involvement Pertinent to Current Situation/Hospitalization:  No - Comment as needed Significant Relationships:  Parents, Siblings, Dependent Children Lives with:  Siblings Do you feel safe going back to the place where you live?  Yes Need for family participation in patient care:  No (Coment)  Care giving concerns:  Due to IV Drug Abuse History, Patient will need to receive his IV Antibiotics from a Skilled Nursing Facility   Social Worker assessment / plan:  Patient is a 25 YO Caucasian male who [facility] with medical history significant of HTN and polysubstance abuse presenting with rib pain and SOB.  CSW engaged with Patient at his bedside, introduced self, role of CSW, and discussed substance abuse concerns and SNF placement. Patient reports that he overdosed on heroin on 10/13 and a bystander performed CPR for 20 minutes. No further use since then.  Has had pain since the heroin wore off later that night and it is ongoing.  SOB worsened today, wheezing.  He believes that he has broken ribs which are contributing to the pain. Patient reports prior to his relapse this month, he had been clean for 90 days after receiving treatment at the [facility] in  [place]. When questioned about his triggers for relapse, Patient reported that he "went around the wrong people". Patient reports that he has been abusing Heroin since he was 25 years old. He reports that this is his first overdose and reports no desire to return to drugs after this experience. Patient reports that he was diagnosed with anxiety and depression at [place] and received outpatient mental health treatment briefly. Patient reports that he now receives his psychotropic medications from community care in [place]. 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 CSW to contact financial counseling to speak with patient and start Medicaid application. CSW explained SNF placement and Patient is agreeable. CSW also iterated to patient that it may not be local. Patient reports that he needs the IV antibiotics to get better so he understands that he needs to go regardless of location. Patient reports that post discharge from the SNF, he will be staying with his brother who is also in recovery from Heroin (4 years). Patient reports that his father is deceased but notes that he has a good relationship with his mother. Patient also reports that he has a 26 year old daughter. CSW to obtain approval for SNF LOG. CSW has completed FL-2 and will send to appropriate SNFs. CSW continues to follow for disposition.   Employment status:  [employer] Insurance information:  Self Pay (Medicaid Pending) PT Recommendations:  No Follow Up Information / Referral to community resources:  Residential Substance Abuse Treatment  Options, Outpatient Substance Abuse Treatment Options, Skilled Nursing Facility  Patient/Family's Response to care:  Patient is appreciative of care received at this time and denies any concerns.   Patient/Family's Understanding of and Emotional Response to Diagnosis, Current Treatment, and Prognosis:  Patient verbalized strong understanding of diagnosis,  current treatment, and prognosis. Patient agreeable to SNF placement for completion of his IV antibiotics. Patient reports a desire to regain and maintain his sobriety and is hopeful for recovery. Patient reports a desire to get well for himself and his 26 year old daughter.   Emotional Assessment Appearance:  Appears stated age Attitude/Demeanor/Rapport:  Other (cooperative; engaging; appropriate) Affect (typically observed):  Accepting, Appropriate, Calm, Hopeful, Pleasant Orientation:  Oriented to Self, Oriented to Place, Oriented to  Time, Oriented to Situation Alcohol / Substance use:  Illicit Drugs Psych involvement (Current and /or in the community):  No (Comment)  Discharge Needs  Concerns to be addressed:  Substance Abuse Concerns, Financial / Insurance Concerns, Care Coordination Readmission within the last 30 days:  No Current discharge risk:  None Barriers to Discharge:  Inadequate or no insurance   Bryan Germanyshley N Gardner, LCSW 09/04/2016, 1:30 PM

## 2016-09-04 NOTE — NC FL2 (Signed)
[place] MEDICAID FL2 LEVEL OF CARE SCREENING TOOL     IDENTIFICATION  Patient Name: Bryan CageJames Jennings Birthdate: 12-30-1990 Sex: male Admission Date (Current Location): 08/21/2016  [facility] [place]   Facility and Address:  [facility], [address], [place], [place] [address]      Provider Number: [id number]  Attending Physician Name and Address:  Kerin PernaPeter Van Trigt, MD  Relative Name and Phone Number:  [facility]inChristopher [facility](Brother) [phone]    Current Level of Care: Hospital Recommended Level of Care: Skilled Nursing Facility Prior Approval Number:    Date Approved/Denied:   PASRR Number: [id number]  Discharge Plan: SNF    Current Diagnoses: Patient Active Problem List   Diagnosis Date Noted  . Encounter for imaging study to confirm orogastric (OG) tube placement   . Empyema lung (HCC)   . Substance abuse   . Pleural effusion on right   . Sepsis (HCC) 08/22/2016  . Opiate withdrawal (HCC) 08/22/2016  . Hyponatremia 08/22/2016  . Hypokalemia 08/22/2016  . Essential hypertension 08/22/2016  . Opioid-induced mood disorder (HCC)   . Community acquired pneumonia 08/21/2016  . S/P alcohol detoxification 11/04/2013  . Polysubstance dependence including opioid type drug, episodic abuse (HCC) 11/04/2013  . PTSD (post-traumatic stress disorder) 11/04/2013  . Depressive disorder, not elsewhere classified 11/04/2013    Orientation RESPIRATION BLADDER Height & Weight     Self, Time, Situation, Place  Normal Continent Weight: 252 lb 3.3 oz (114.4 kg) Height:  5\' 10"  (177.8 cm)  BEHAVIORAL SYMPTOMS/MOOD NEUROLOGICAL BOWEL NUTRITION STATUS   (NONE)   Continent Diet (Regular Diet)  AMBULATORY STATUS COMMUNICATION OF NEEDS Skin   Independent Verbally Surgical wounds (Closed right chest incision- liquid skin adhesive )                       Personal Care Assistance Level of Assistance  Bathing, Feeding, Dressing Bathing  Assistance: Independent Feeding assistance: Independent Dressing Assistance: Independent     Functional Limitations Info  Sight, Hearing, Speech Sight Info: Adequate Hearing Info: Adequate Speech Info: Adequate    SPECIAL CARE FACTORS FREQUENCY                       Contractures Contractures Info: Not present    Additional Factors Info  Code Status, Allergies, Psychotropic Code Status Info: FULL Allergies Info: No Known Allergies Psychotropic Info: Cymbalta; Xanax; Atarax/Vistaril; Ambien         Current Medications (09/04/2016):  This is the current hospital active medication list Current Facility-Administered Medications  Medication Dose Route Frequency Provider Last Rate Last Dose  . 0.9 %  sodium chloride infusion   Intravenous Continuous Hyacinth Meekerasrif Ahmed, MD   Stopped at 09/04/16 [id number]  . albuterol (PROVENTIL) (2.5 MG/3ML) 0.083% nebulizer solution 2.5 mg  2.5 mg Nebulization Q4H PRN Lenox PondsEdwin Silva Zapata, MD   2.5 mg at 08/28/16 1105  . ALPRAZolam Prudy Feeler(XANAX) tablet 0.5 mg  0.5 mg Oral BID PRN Kerin PernaPeter Van Trigt, MD   0.5 mg at 09/04/16 0746  . chlorhexidine (PERIDEX) 0.12 % solution 15 mL  15 mL Mouth Rinse BID Kerin PernaPeter Van Trigt, MD   15 mL at 09/04/16 1041  . DULoxetine (CYMBALTA) DR capsule 60 mg  60 mg Oral Daily Sharlene Doryessa N Conte, PA-C   60 mg at 09/04/16 1039  . enoxaparin (LOVENOX) injection 40 mg  40 mg Subcutaneous Q24H Kerin PernaPeter Van Trigt, MD   40 mg at  09/03/16 1810  . furosemide (LASIX) injection 20 mg  20 mg Intravenous BID Kerin PernaPeter Van Trigt, MD   20 mg at 09/04/16 0746  . gabapentin (NEURONTIN) tablet 300 mg  300 mg Per Tube TID Roslynn AmbleJennings E Nestor, MD   300 mg at 09/04/16 1039  . ketorolac (TORADOL) tablet 10 mg  10 mg Oral Q6H PRN Jeanella CrazeBrandi L Ollis, NP   10 mg at 09/04/16 1121  . MEDLINE mouth rinse  15 mL Mouth Rinse q12n4p Kerin PernaPeter Van Trigt, MD   15 mL at 09/03/16 1607  . ondansetron (ZOFRAN) injection 4 mg  4 mg Intravenous Q6H PRN Sharlene Doryessa N Conte, PA-C      . oxyCODONE (Oxy  IR/ROXICODONE) immediate release tablet 10 mg  10 mg Oral Q4H PRN Ardelle Ballsonielle M Zimmerman, PA-C   10 mg at 09/04/16 0746  . senna (SENOKOT) tablet 8.6 mg  1 tablet Oral BID Kerin PernaPeter Van Trigt, MD   8.6 mg at 09/03/16 0824  . vancomycin (VANCOCIN) 1,250 mg in sodium chloride 0.9 % 250 mL IVPB  1,250 mg Intravenous Q8H Renee J Ackley, RPH   1,250 mg at 09/04/16 0747  . zolpidem (AMBIEN) tablet 5 mg  5 mg Oral QHS PRN Rowe ClackWayne E Gold, PA-C   5 mg at 09/03/16 2208     Discharge Medications: Please see discharge summary for a list of discharge medications.  Relevant Imaging Results:  Relevant Lab Results:   Additional Information SS #[id number]; Patient needs IV antibiotics   Bryan GermanyAshley N Gardner, LCSW

## 2016-09-04 NOTE — Progress Notes (Addendum)
       [address]       [place] [id number]             [phone]      6 Days Post-Op Procedure(s) (LRB): VIDEO ASSISTED THORACOSCOPY (VATS)/ DRAINAGE EMPYEMA (Right) THORACOTOMY MAJOR (Right) Subjective: Feeling stronger every day. No issues overnight.   Objective: Vital signs in last 24 hours: Temp:  [98.1 F (36.7 C)-98.9 F (37.2 C)] 98.4 F (36.9 C) (10/31 0700) Pulse Rate:  [87-111] 111 (10/31 0700) Cardiac Rhythm: Sinus tachycardia (10/31 0700) Resp:  [31-34] 32 (10/31 0700) BP: (126-143)/(73-91) 126/73 (10/31 0700) SpO2:  [93 %-94 %] 94 % (10/31 0700)     Intake/Output from previous day: 10/30 0701 - 10/31 0700 In: 2210 [P.O.:960; I.V.:500; IV Piggyback:750] Out: 854 [Urine:854] Intake/Output this shift: No intake/output data recorded.  General appearance: alert, cooperative and no distress Heart: regular rate and rhythm Lungs: diminished lower lobes bilaterally Abdomen: soft, non-tender; bowel sounds normal; no masses,  no organomegaly Extremities: extremities normal, atraumatic, no cyanosis or edema Wound: clean and dry  Lab Results: No results for input(s): WBC, HGB, HCT, PLT in the last 72 hours. BMET: No results for input(s): NA, K, CL, CO2, GLUCOSE, BUN, CREATININE, CALCIUM in the last 72 hours.  PT/INR: No results for input(s): LABPROT, INR in the last 72 hours. ABG    Component Value Date/Time   PHART 7.384 08/30/2016 0515   HCO3 25.5 08/30/2016 0515   TCO2 25 08/30/2016 1839   ACIDBASEDEF 2.0 08/29/2016 1230   O2SAT 98.7 08/30/2016 0515   CBG (last 3)  No results for input(s): GLUCAP in the last 72 hours.  Assessment/Plan: S/P Procedure(s) (LRB): VIDEO ASSISTED THORACOSCOPY (VATS)/ DRAINAGE EMPYEMA (Right) THORACOTOMY MAJOR (Right)  1. Progressing well.  2. CV- sinus tachycardia, good blood pressure control 3. Pulm- On room air. Encouraged IS hourly. Chest tubes removed 4. Renal-creatinine stable. Making good  urine 5. On IV antibiotics for bacteremia per Id. Will likely need for week. Consult to case management for possible LTAC placement.  6. Incisions clean and dry with a small amount of dry drainage on the dressing   LOS: 14 days    Sharlene Doryessa N Conte 09/04/2016  patient examined and medical record reviewed,agree with above note. [name] 09/04/2016

## 2016-09-05 DIAGNOSIS — A4102 Sepsis due to Methicillin resistant Staphylococcus aureus: Secondary | ICD-10-CM

## 2016-09-05 DIAGNOSIS — Z9889 Other specified postprocedural states: Secondary | ICD-10-CM

## 2016-09-05 DIAGNOSIS — B9562 Methicillin resistant Staphylococcus aureus infection as the cause of diseases classified elsewhere: Secondary | ICD-10-CM

## 2016-09-05 DIAGNOSIS — B1920 Unspecified viral hepatitis C without hepatic coma: Secondary | ICD-10-CM

## 2016-09-05 LAB — CBC
HCT: 29.8 % — ABNORMAL LOW (ref 39.0–52.0)
Hemoglobin: 9.5 g/dL — ABNORMAL LOW (ref 13.0–17.0)
MCH: 28.3 pg (ref 26.0–34.0)
MCHC: 31.9 g/dL (ref 30.0–36.0)
MCV: 88.7 fL (ref 78.0–100.0)
Platelets: 424 10*3/uL — ABNORMAL HIGH (ref 150–400)
RBC: 3.36 MIL/uL — ABNORMAL LOW (ref 4.22–5.81)
RDW: 13.1 % (ref 11.5–15.5)
WBC: 8.8 10*3/uL (ref 4.0–10.5)

## 2016-09-05 NOTE — Clinical Social Work Note (Signed)
CSW faxed referral [facility]. Per ID, patient may not be in need of IV antibiotics pending blood cultures.  Charlynn CourtSarah Murry Khiev, CSW [phone]

## 2016-09-05 NOTE — Care Management Note (Signed)
Case Management Note  Patient Details  Name: [place] Cage[place] MRN: [id number] Date of Birth: 1990/12/29  Subjective/Objective:   NCM was informed to try linezolid 600mg  q12 po for patient to see if would be able to do for him by match since he has no insurance.  NCM called linezolid patient assistance and with patient consent gave information to them , patient was approved for linezolid at 100%.  Later this after noon was informed by Julian Hyessa PA that they found out patient was in [facility] ER and they need to check blood cx's to determine if he will need to be on iv abx or po abx.  They are awaiting to receive information from [facility].  CSW working on SNF for patient in case will need for iv abx. If does not need iv abx and goes on po [facility]  NCM will cont to follow for dc needs.                  Action/Plan:   Expected Discharge Date:                  Expected Discharge Plan:  Skilled Nursing Facility  In-House Referral:  Clinical Social Work  Discharge planning Services  CM Consult, [facility], [facility], Medication Assistance  Post Acute Care Choice:    Choice offered to:  Patient  DME Arranged:    DME Agency:     HH Arranged:    HH Agency:     Status of Service:  Completed, signed off  If discussed at [employer] Meetings, dates discussed:    Additional Comments:  Leone Havenaylor, Keily Lepp Clinton, RN 09/05/2016, 4:23 PM

## 2016-09-05 NOTE — Progress Notes (Addendum)
[address]       [place] [id number]             [phone]      7 Days Post-Op Procedure(s) (LRB): VIDEO ASSISTED THORACOSCOPY (VATS)/ DRAINAGE EMPYEMA (Right) THORACOTOMY MAJOR (Right) Subjective: conts to feel well  Objective: Vital signs in last 24 hours: Temp:  [98.2 F (36.8 C)-98.9 F (37.2 C)] 98.7 F (37.1 C) (11/01 0700) Pulse Rate:  [80-122] 97 (11/01 0700) Cardiac Rhythm: Normal sinus rhythm (11/01 0700) Resp:  [25-43] 27 (11/01 0700) BP: (109-137)/(58-79) 126/74 (11/01 0700) SpO2:  [94 %-99 %] 96 % (11/01 0700)  Hemodynamic parameters for last 24 hours:    Intake/Output from previous day: 10/31 0701 - 11/01 0700 In: 2190 [P.O.:1440; IV Piggyback:750] Out: 2125 [Urine:2125] Intake/Output this shift: No intake/output data recorded.  General appearance: alert, cooperative and no distress Heart: regular rate and rhythm Lungs: clear to auscultation bilaterally Abdomen: benign Extremities: no edema Wound: incis healing well  Lab Results:  Recent Labs  09/05/16 0352  WBC 8.8  HGB 9.5*  HCT 29.8*  PLT 424*   BMET: No results for input(s): NA, K, CL, CO2, GLUCOSE, BUN, CREATININE, CALCIUM in the last 72 hours.  PT/INR: No results for input(s): LABPROT, INR in the last 72 hours. ABG    Component Value Date/Time   PHART 7.384 08/30/2016 0515   HCO3 25.5 08/30/2016 0515   TCO2 25 08/30/2016 1839   ACIDBASEDEF 2.0 08/29/2016 1230   O2SAT 98.7 08/30/2016 0515   CBG (last 3)  No results for input(s): GLUCAP in the last 72 hours.  Meds Scheduled Meds: . chlorhexidine  15 mL Mouth Rinse BID  . DULoxetine  60 mg Oral Daily  . enoxaparin (LOVENOX) injection  40 mg Subcutaneous Q24H  . gabapentin  300 mg Per Tube TID  . metoprolol tartrate  12.5 mg Oral BID  . senna  1 tablet Oral BID  . vancomycin  1,250 mg Intravenous Q8H   Continuous Infusions: . sodium chloride Stopped (09/04/16 0338)   PRN Meds:.ALPRAZolam,  ketorolac, ondansetron (ZOFRAN) IV, oxyCODONE, zolpidem  Xrays Ct Chest W Contrast  Result Date: 09/03/2016 CLINICAL DATA:  Followup right lung pneumonia EXAM: CT CHEST WITH CONTRAST TECHNIQUE: Multidetector CT imaging of the chest was performed during intravenous contrast administration. CONTRAST:  75mL ISOVUE-300 IOPAMIDOL (ISOVUE-300) INJECTION 61% COMPARISON:  08/27/2016 FINDINGS: Cardiovascular: Normal heart size.  No pericardial effusion. Mediastinum/Nodes: The trachea appears patent and is midline. Normal appearance of the esophagus. Numerous prominent mediastinal lymph nodes are identified. Right paratracheal node measures 11 mm, image number 46 of series 2. Previously this measured the same. Pre-vascular lymph node measures 1 cm, image 35 of series 2. Previously 8 mm. There is a sub- carinal lymph node which measures 1.2 cm, image 65 of series 2. Previously 7 mm. Lungs/Pleura: The moderate volume loculated right pleural effusion is decreased in volume when compared with study from 08/27/2016. Gas within the pleural space is identified which may be related to recent chest tube and/or infection. There is also gas identified within the lateral right chest wall which is likely related to instrumentation. Pleural thickening along the major and minor fissures of the right lung noted. Left lower lobe airspace consolidation identified compatible with persistent pneumonia. Subsegmental areas of atelectasis noted within the left lower lobe, superior segment of right lower lobe and right middle lobe. Gas Upper Abdomen: No acute abnormality. Musculoskeletal: Stable appearance of displaced fracture involving the anterior  aspect of the right fifth rib, image 83 of series 2. IMPRESSION: 1. Persistent loculated right pleural effusion is decreased in volume from the previous exam. 2. Right lower lobe airspace consolidation compatible with residual pneumonia. 3. Multifocal areas of lower lobe predominant subsegmental  atelectasis noted involving both lungs. 4. Progression of mediastinal adenopathy, likely reactive. 5. Stable appearance of right anterior rib fracture Electronically Signed   By: Signa Kellaylor  Stroud M.D.   On: 09/03/2016 15:45    Assessment/Plan: S/P Procedure(s) (LRB): VIDEO ASSISTED THORACOSCOPY (VATS)/ DRAINAGE EMPYEMA (Right) THORACOTOMY MAJOR (Right)  1 steady improvement 2 conts vanco - length of tx yet to be determined 3 SW assisting with post discharge planning 4 no leukocytosis- no fevers   LOS: 15 days    GOLD,WAYNE E 09/05/2016 Patient will need 2 weeks of IV vancomycin total for MRSA empyema Ready to transfer to nursing SNF for completion of IV antibiotics Agree with above assessment and plan Kathlee NationsPeter Van trigt M.D.

## 2016-09-05 NOTE — Progress Notes (Signed)
      [address]       [place] [id number]             [phone]      Spoke with ID regarding antibiotic therapy. The patient shares he [facility]. He shared no one told him he had bacteria in his blood. However, on admission there was documented bacteremia. He did have positive OR fluid cultures for MRSA. We will obtain the records from [facility] to see if he had positive blood cultures. [facility] blood cultures were [facility].

## 2016-09-05 NOTE — Consult Note (Signed)
[facility]       Reason for Consult: Bacteremia and empyema    Referring Physician: Dr. Donata ClayVan Trigt  Principal Problem:   Sepsis [facility](HCC) Active Problems:   Polysubstance dependence including opioid type drug, episodic abuse (HCC)   Community acquired pneumonia   Opiate withdrawal (HCC)   Hyponatremia   Hypokalemia   Essential hypertension   Opioid-induced mood disorder (HCC)   Pleural effusion on right   Empyema lung (HCC)   Substance abuse   Encounter for imaging study to confirm orogastric (OG) tube placement   . chlorhexidine  15 mL Mouth Rinse BID  . DULoxetine  60 mg Oral Daily  . enoxaparin (LOVENOX) injection  40 mg Subcutaneous Q24H  . gabapentin  300 mg Per Tube TID  . metoprolol tartrate  12.5 mg Oral BID  . senna  1 tablet Oral BID  . vancomycin  1,250 mg Intravenous Q8H    Recommendations: If bacteremia, will need further work up including TEE If no bacteremia, I would use linezolid 600 mg po twice a day for 4 weeks with follow up  He will need to stop cymbalta on linezolid due to potential serotonin syndrome so I will stop now Will need further evaluation for hepatitis C as an outpatient  We will arrange follow up in our clinic in 3-4 weeks  Assessment: He has MRSA in empyema and I was consulted for 'bacteremia' though I do not see any positive blood cultures.  Nothing in chart from OSH.  If no bacteremia, I would recommend treatment with oral therapy and linezolid is adequate and has been approved.  It is also sensitive to doxycycline and TMP/SMX.  He does not require IV therapy without bacteremia.   Hepatitis C positive.    Antibiotics: vancomycin  HPI: Bryan Jennings is a 25 y.o. male with IVDU history and recent overdose requiring bystander CPR and subsequent broken rib and initially presented with SOB and fever and work up revealed empyema requiring VATS and culture grew MRSA.  Sugery done on 10/25.  He has had recent IVDU.   He has remained afebrile and improving post surgery.  Consulted for recommendations at discharge.  Taking po, no diarrhea.   CT independently reviewed and improving consolidation.   Review of Systems:  Constitutional: negative for fevers, chills and malaise Gastrointestinal: negative for diarrhea Musculoskeletal: negative for myalgias and arthralgias All other systems reviewed and are negative   Past Medical History:  Diagnosis Date  . Hypertension   . Substance abuse     Social History  Substance Use Topics  . Smoking status: Current Every Day Smoker    Packs/day: 1.00    Years: 12.00    Types: Cigarettes  . Smokeless tobacco: Never Used  . Alcohol use Yes     Comment: etoh abuse - reports unsure when he last used, "I just don't like it"    Family History  Problem Relation Age of Onset  . Diabetes Mother   . Hypertension Mother   . CAD Father [id number]    No Known Allergies  Physical Exam: Constitutional: in no apparent distress and alert  Vitals:   09/05/16 0700 09/05/16 1138  BP: 126/74 121/61  Pulse: 97 84  Resp: (!) 27 (!) 28  Temp: 98.7 F (37.1 C) 97.9 F (36.6 C)   EYES: anicteric ENMT:no thrush Cardiovascular: Cor RRR Respiratory: CTA B; normal respiratory effort; some pleuritic discomfort GI: Bowel sounds are normal, liver is not enlarged,  spleen is not enlarged Musculoskeletal: no pedal edema noted Skin: negatives: no rash Hematologic: no cervical lad  Lab Results  Component Value Date   WBC 8.8 09/05/2016   HGB 9.5 (L) 09/05/2016   HCT 29.8 (L) 09/05/2016   MCV 88.7 09/05/2016   PLT 424 (H) 09/05/2016    Lab Results  Component Value Date   CREATININE 0.72 09/01/2016   BUN 10 09/01/2016   NA 138 09/01/2016   K 4.2 09/01/2016   CL 104 09/01/2016   CO2 26 09/01/2016    Lab Results  Component Value Date   ALT 33 08/31/2016   AST 44 (H) 08/31/2016   ALKPHOS 46 08/31/2016     Microbiology: Recent Results (from the past 240 hour(s))    Surgical pcr screen     Status: None   Collection Time: 08/29/16 12:20 AM  Result Value Ref Range Status   MRSA, PCR NEGATIVE NEGATIVE Final   Staphylococcus aureus NEGATIVE NEGATIVE Final    Comment:        The Xpert SA Assay (FDA approved for NASAL specimens in patients over 421 years of age), is one component of a comprehensive surveillance program.  Test performance has been validated by [facility] for patients greater than or equal to 26 year old. It is not intended to diagnose infection nor to guide or monitor treatment.   Body fluid culture     Status: None   Collection Time: 08/29/16 11:17 AM  Result Value Ref Range Status   Specimen Description FLUID RIGHT PLEURAL  Final   Special Requests POF ZINACEF ZOSYN AND CLINDAMYCIN  Final   Gram Stain   Final    MODERATE WBC PRESENT, PREDOMINANTLY PMN NO ORGANISMS SEEN    Culture   Final    RARE METHICILLIN RESISTANT STAPHYLOCOCCUS AUREUS CRITICAL RESULT CALLED TO, READ BACK BY AND VERIFIED WITH: Kathlene November. ECKELMANN, NURSE AT 1018 ON [id number] BY Lucienne CapersS. YARBROUGH    Report Status 09/02/2016 FINAL  Final   Organism ID, Bacteria METHICILLIN RESISTANT STAPHYLOCOCCUS AUREUS  Final      Susceptibility   Methicillin resistant staphylococcus aureus - MIC*    CIPROFLOXACIN >=8 RESISTANT Resistant     ERYTHROMYCIN <=0.25 SENSITIVE Sensitive     GENTAMICIN <=0.5 SENSITIVE Sensitive     OXACILLIN >=4 RESISTANT Resistant     TETRACYCLINE <=1 SENSITIVE Sensitive     VANCOMYCIN <=0.5 SENSITIVE Sensitive     TRIMETH/SULFA <=10 SENSITIVE Sensitive     CLINDAMYCIN <=0.25 SENSITIVE Sensitive     RIFAMPIN <=0.5 SENSITIVE Sensitive     Inducible Clindamycin NEGATIVE Sensitive     * RARE METHICILLIN RESISTANT STAPHYLOCOCCUS AUREUS  Fungus Stain     Status: None   Collection Time: 08/29/16 11:17 AM  Result Value Ref Range Status   FUNGUS STAIN Final report  Final    Comment: (NOTE) Performed At: [facility] [address] [place], [place]  [id number] Mila HomerHancock William F MD BJ:[id number]:[phone]    Fungal Source FLUID  Final    Comment: PLEURAL RIGHT POF ZINACEF ZOSYN AND CLINDAMYCIN   Acid Fast Smear (AFB)     Status: None   Collection Time: 08/29/16 11:17 AM  Result Value Ref Range Status   AFB Specimen Processing Concentration  Final   Acid Fast Smear Negative  Final    Comment: (NOTE) Performed At: [facility]  [address] [place], [place] [id number] Mila HomerHancock William F MD GE:[id number]:[phone]    Source (AFB) FLUID  Final    Comment: RIGHT  PLEURAL POF ZINACEF ZOSYN AND CLINDAMYCIN   Fungal Stain reflex     Status: None   Collection Time: 08/29/16 11:17 AM  Result Value Ref Range Status   Fungal stain result 1 Comment  Final    Comment: (NOTE) KOH/Calcofluor preparation:  no fungus observed. Performed At: [facility] [address] [place], [place] [id number] Mila Homer MD ZH:[id number]   Aerobic/Anaerobic Culture (surgical/deep wound)     Status: None   Collection Time: 08/29/16 11:28 AM  Result Value Ref Range Status   Specimen Description TISSUE RIGHT PLEURAL  Final   Special Requests POF ZINACEF ZOSYN AND CLINDAMYCIN  Final   Gram Stain   Final    FEW WBC PRESENT, PREDOMINANTLY PMN RARE GRAM NEGATIVE RODS RARE GRAM POSITIVE COCCI IN CLUSTERS IN PAIRS    Culture   Final    FEW METHICILLIN RESISTANT STAPHYLOCOCCUS AUREUS NO ANAEROBES ISOLATED    Report Status 09/03/2016 FINAL  Final   Organism ID, Bacteria METHICILLIN RESISTANT STAPHYLOCOCCUS AUREUS  Final      Susceptibility   Methicillin resistant staphylococcus aureus - MIC*    CIPROFLOXACIN >=8 RESISTANT Resistant     ERYTHROMYCIN <=0.25 SENSITIVE Sensitive     GENTAMICIN <=0.5 SENSITIVE Sensitive     OXACILLIN >=4 RESISTANT Resistant     TETRACYCLINE <=1 SENSITIVE Sensitive     VANCOMYCIN <=0.5 SENSITIVE Sensitive     TRIMETH/SULFA <=10 SENSITIVE Sensitive     CLINDAMYCIN <=0.25 SENSITIVE Sensitive     RIFAMPIN <=0.5 SENSITIVE  Sensitive     Inducible Clindamycin NEGATIVE Sensitive     * FEW METHICILLIN RESISTANT STAPHYLOCOCCUS AUREUS  Culture, respiratory (NON-Expectorated)     Status: None   Collection Time: 08/29/16  4:22 PM  Result Value Ref Range Status   Specimen Description TRACHEAL ASPIRATE  Final   Special Requests Normal  Final   Gram Stain   Final    ABUNDANT WBC PRESENT, PREDOMINANTLY PMN NO ORGANISMS SEEN    Culture Consistent with normal respiratory flora.  Final   Report Status 09/01/2016 FINAL  Final  Culture, blood (routine x 2)     Status: None   Collection Time: 08/30/16 11:30 AM  Result Value Ref Range Status   Specimen Description BLOOD RIGHT ARM  Final   Special Requests IN PEDIATRIC BOTTLE 1CC  Final   Culture NO GROWTH 5 DAYS  Final   Report Status 09/04/2016 FINAL  Final  Culture, blood (routine x 2)     Status: None   Collection Time: 08/30/16 12:55 PM  Result Value Ref Range Status   Specimen Description BLOOD RIGHT THUMB  Final   Special Requests IN PEDIATRIC BOTTLE  1.5CC  Final   Culture NO GROWTH 5 DAYS  Final   Report Status 09/04/2016 FINAL  Final    Staci Righter, MD [facility] [email or web address] [phone] pager  [phone] cell 09/05/2016, 12:09 PM

## 2016-09-05 NOTE — Progress Notes (Signed)
Name: Bryan Jennings MRN: [id number] DOB: 20-Jan-1991    ADMISSION DATE:  08/21/2016 CONSULTATION DATE:  08/27/2016  REFERRING MD :  Dr. Randel Pigg  CHIEF COMPLAINT:  Effusion  HISTORY OF PRESENT ILLNESS:  25 year old male with PMH of HTN and substance abuse including IVDA. Apparently he overdosed on heroin 10/13 and a bystander performed CPR. He woke up with EMS by narcan and declined hospitalization at that time. He felt as though he had cracked a rib and has had chest pain. He presented to the emergency department a few days later 10/18 with CC SOB and chest pain. He was admitted to the hospitalist team for sepsis/CAP. He was treated with CAP antibiotics. Unfortunately he did not have much improvement in his symptoms and WBC continued to rise. He was broadened to Zosyn. 10/23 CXR demonstrated large R pleural effusion. Pulmonary consulted for possible thora.  Ultimately found to have an empyema and required a R VATS.    SUBJECTIVE: Pt reports improved pain at site.  Hopeful to be able to go home instead of LTAC/SNF.  His brother is willing to let him stay with him at d/c.     VITAL SIGNS: Temp:  [98.2 F (36.8 C)-98.9 F (37.2 C)] 98.7 F (37.1 C) (11/01 0700) Pulse Rate:  [80-122] 97 (11/01 0700) Resp:  [25-43] 27 (11/01 0700) BP: (109-137)/(58-79) 126/74 (11/01 0700) SpO2:  [94 %-99 %] 96 % (11/01 0700)  PHYSICAL EXAMINATION: General:  Young male in NAD, awake, follows commands. Neuro:  Alert, awake  HEENT:  [place]/AT, PERRL, no JVD Cardiovascular:  RRR, no MRG Lungs:  Diminished BS on right side, clear on L, surgical sites c/d/i, well approximated.  Abdomen:  Soft, non-tender Musculoskeletal:  No acute deformity or ROM limitation Skin:  Grossly intact   Recent Labs Lab 08/30/16 0415 08/30/16 1839 08/31/16 0500 09/01/16 0415  NA 138 139 138 138  K 4.4 3.7 3.7 4.2  CL 105 101 104 104  CO2 27  --  26 26  BUN 11 10 11 10   CREATININE 0.81 0.90 0.99 0.72  GLUCOSE 91 126*  105* 95    Recent Labs Lab 08/31/16 0500 09/01/16 0415 09/05/16 0352  HGB 9.6* 9.8* 9.5*  HCT 29.9* 30.5* 29.8*  WBC 14.5* 12.9* 8.8  PLT 425* 501* 424*   Ct Chest W Contrast  Result Date: 09/03/2016 CLINICAL DATA:  Followup right lung pneumonia EXAM: CT CHEST WITH CONTRAST TECHNIQUE: Multidetector CT imaging of the chest was performed during intravenous contrast administration. CONTRAST:  75mL ISOVUE-300 IOPAMIDOL (ISOVUE-300) INJECTION 61% COMPARISON:  08/27/2016 FINDINGS: Cardiovascular: Normal heart size.  No pericardial effusion. Mediastinum/Nodes: The trachea appears patent and is midline. Normal appearance of the esophagus. Numerous prominent mediastinal lymph nodes are identified. Right paratracheal node measures 11 mm, image number 46 of series 2. Previously this measured the same. Pre-vascular lymph node measures 1 cm, image 35 of series 2. Previously 8 mm. There is a sub- carinal lymph node which measures 1.2 cm, image 65 of series 2. Previously 7 mm. Lungs/Pleura: The moderate volume loculated right pleural effusion is decreased in volume when compared with study from 08/27/2016. Gas within the pleural space is identified which may be related to recent chest tube and/or infection. There is also gas identified within the lateral right chest wall which is likely related to instrumentation. Pleural thickening along the major and minor fissures of the right lung noted. Left lower lobe airspace consolidation identified compatible with persistent pneumonia. Subsegmental areas of  atelectasis noted within the left lower lobe, superior segment of right lower lobe and right middle lobe. Gas Upper Abdomen: No acute abnormality. Musculoskeletal: Stable appearance of displaced fracture involving the anterior aspect of the right fifth rib, image 83 of series 2. IMPRESSION: 1. Persistent loculated right pleural effusion is decreased in volume from the previous exam. 2. Right lower lobe airspace  consolidation compatible with residual pneumonia. 3. Multifocal areas of lower lobe predominant subsegmental atelectasis noted involving both lungs. 4. Progression of mediastinal adenopathy, likely reactive. 5. Stable appearance of right anterior rib fracture Electronically Signed   By: Signa Kellaylor  Stroud M.D.   On: 09/03/2016 15:45   SIGNIFICANT EVENTS  10/13  CPR/ Narcan for Heroin OD > Refused hospitalization at time 10/18  Admit for SOB/ CP > Sepsis/ CAP 10/25  R VATS,intubated.  10/26  Extubated 10/29  CT d/c'd   STUDIES:  CT chest 10/17 >> Limited evaluation for pulmonary embolism due to respiratory motion artifact. No definite central pulmonary artery embolus identified. Small right pleural effusion with right lung base atelectasis versus pneumonia. Small patchy left lung base density may represent atelectatic changes versus infiltrate.  CT chest 10/23 >> Minimally displaced fracture  anterior portion of right fifth rib.Stable mildly enlarged mediastinal adenopathy  Most likely is inflammatory or reactive in etiology.Large right pleural effusion  is significantly increased in size compared to prior exam. There is adjacent subsegmental atelectasis the right upper lobe and   right lower lobe. Focal loculated pleural effusion is seen anteriorly and medially in the right hemi thorax adjacent to the right cardiac border. Substantial thickening of the musculature of the right chest wall is noted consistent with inflammation, with possible fluid collection forming in this area as well. ECHO 10/26 >> no vegetations. Normal EF, trivial pericardial effusion. CT Chest 10/30 >> persistent loculated R pleural effusion, decreased in volume from prior, RLL consolidation with residual PNA, multifocal areas of LLL atelectasis, R anterior R fracture  CULTURES: BCx2 10/17 >> neg Sputum 10/25 >> normal flora AFB 10/25 >>  Fungus 10/25 >> neg prelim >>  R Pleural Tissue 10/25 >> MRSA R Pleural Fluid 10/25 >>  MRSA BCx2 10/26 >> neg  ANTIBIOTICS:  Vanco 10/25 >>    ASSESSMENT / PLAN:  25 y.o. male with history of substance and IV drug use with large R empyema likely r/t rib fx in setting bystander CPR after drug OD.  Remains on vent post R VATS 10/25.   Right MRSA Empyema s/p VATS 10/25 --  Likely traumatic, could be infectious in a IVDA patient vs untreated PNA as recently had CAP.  Cell count 1950 WBC, 32% neutrophil, 62% monocyte-macrophage. ECHO negative for vegetation.  Repeat CT eval 10/30 with residual loculated effusion / PNA  Plan: Continue vancomycin, D8/x  Discussed case with ID, will need minimum of 4 weeks of antibiotic therapy D/C options for abx:  1) d/c on oral linezolid with weekly/biweekly monitoring of platelets or 2) IV vanco with SNF admit Have discussed above with RN CM to see if we can get match assistance for linezolid Stressed importance with patient regarding follow up etc. Also educated him regarding bacteremia risks/symptoms.  [facility] office for follow up CXR's Completed zosyn x7 days Monitor fever curve / WBC trend  Trend CXR intermittently > monitor for reaccumulation   Acute hypoxic resp failure 2/2 to R empyema  Plan: Pulmonary hygiene - IS, mobilize. Lasix as renal function /  BP permit  Recently treated CAP now with empyema (traumatic vs infectious)  Plan: Cont abx for now  Rib fracture  Oxycodone Q4 PRN, will need some opiate in system due chronic IVDU.  Consider slow wean to off while inpatient  Minimize sedating medications as able  PRN Toradol for chest pain > monitor renal function closely  Voltaren gel.   Heroine abuse Last used 2 weeks PTA, tried to quit in the past.  We discussed the risk of using IVDU and he understands it can be lethal. Social work assisting him with resources.     PCCM will be available PRN.  Please call if we can be of further assistance.    Canary BrimBrandi Ollis,  NP-C [facility] Pgr: [phone] or if no answer [phone] 09/05/2016, 9:54 AM   Attending note: I have seen and examined the patient with nurse practitioner/resident and agree with the note. History, labs and imaging reviewed.  25 Y/o with HTN, IVDA, OD on heroin, CAP rt empyema. S/p VATS MRSA in the cultures Doing well Still on vancomycin.  Discussed with ID. He can be discharged with PO linezolid for 4 weeks   PCCM will sign off. Please call with any questions.   Chilton GreathousePraveen Deloras Reichard MD [facility] Pager [phone] If no answer or after 3pm call: [phone] 09/05/2016, 2:54 PM

## 2016-09-06 MED ORDER — OXYCODONE HCL 10 MG PO TABS: 10.0000 mg | ORAL_TABLET | Freq: Four times a day (QID) | ORAL | 0 refills | Status: DC | PRN

## 2016-09-06 MED ORDER — LINEZOLID 600 MG PO TABS: 600.0000 mg | ORAL_TABLET | Freq: Two times a day (BID) | ORAL | 0 refills | Status: DC

## 2016-09-06 MED ORDER — LINEZOLID 600 MG PO TABS
600.0000 mg | ORAL_TABLET | Freq: Two times a day (BID) | ORAL | Status: DC
  Administered 2016-09-07: 600 mg via ORAL
  Filled 2016-09-06 (×2): qty 1

## 2016-09-06 MED ORDER — SODIUM CHLORIDE 0.9% FLUSH: 10.0000 mL | INTRAVENOUS | Status: DC | PRN

## 2016-09-06 MED ORDER — OXYCODONE HCL 10 MG PO TABS: 10.0000 mg | ORAL_TABLET | ORAL | 0 refills | Status: DC | PRN

## 2016-09-06 MED ORDER — METOPROLOL TARTRATE 25 MG PO TABS: 12.5000 mg | ORAL_TABLET | Freq: Two times a day (BID) | ORAL | 1 refills | Status: DC

## 2016-09-06 MED FILL — ZYVOX 600 MG TABLET: 600 | 30 days supply | Qty: 60 | Fill #0

## 2016-09-06 NOTE — Progress Notes (Signed)
Patient complaining that his pain meds are not helping. Paged MD to notify. No new orders received. Gave patient hot packs to help. Will continue to monitor.

## 2016-09-06 NOTE — Progress Notes (Addendum)
Pharmacy Antibiotic Note  Bryan CageJames Wissmann is a 25 y.o. male with MRSA empyema s/p R VATS on 10/24.  History of IVDA  Pharmacy has been consulted for vancomycin dosing. Plans noted for 4 weeks of po zyvox. -Last SCr 0.72 and has been stable  -Spoke with Dr. Alla GermanVantright: continue vancomycin until discharge then begin po zyvox. Discontinue Cymbalta (due to risk of serotonin syndrome with Zyvox)  Plan: -Continue Vancomycin 1250 mg IV q8h -discontinue cymbalta -Weekly vancomycin level while   Height: 5\' 10"  (177.8 cm) Weight: 252 lb 3.3 oz (114.4 kg) IBW/kg (Calculated) : 73  Temp (24hrs), Avg:98.5 F (36.9 C), Min:97.8 F (36.6 C), Max:99.2 F (37.3 C)   Recent Labs Lab 08/30/16 1839 08/31/16 0500 09/01/16 0415 09/01/16 1430 09/03/16 2212 09/05/16 0352  WBC  --  14.5* 12.9*  --   --  8.8  CREATININE 0.90 0.99 0.72  --   --   --   VANCOTROUGH  --   --   --  11* 19  --     Estimated Creatinine Clearance: 178.9 mL/min (by C-G formula based on SCr of 0.72 mg/dL).    No Known Allergies   Harland Germanndrew Jett Kulzer, Pharm D 09/06/2016 1:44 PM

## 2016-09-06 NOTE — Progress Notes (Signed)
NCM awaiting disposition, either home with po abx or to snf with iv abx. Per ID patient can be on po zyvox, which has been approved for patient.  Waiting to here from CVTS.

## 2016-09-06 NOTE — Clinical Social Work Note (Signed)
Per RNCM, patient will discharge home on oral medications.   CSW signing off. Consult again if any social work needs arise.  Charlynn CourtSarah Triva Hueber, CSW [phone]

## 2016-09-06 NOTE — Progress Notes (Addendum)
      [address]       [place] [id number]             [phone]      8 Days Post-Op Procedure(s) (LRB): VIDEO ASSISTED THORACOSCOPY (VATS)/ DRAINAGE EMPYEMA (Right) THORACOTOMY MAJOR (Right) Subjective: Shares that he is having more incisional pain this morning and that his lungs burn when he takes a deep breath.   Objective: Vital signs in last 24 hours: Temp:  [97.9 F (36.6 C)-99.2 F (37.3 C)] 98.3 F (36.8 C) (11/02 0454) Pulse Rate:  [79-98] 98 (11/02 0454) Cardiac Rhythm: Sinus tachycardia (11/02 0731) Resp:  [28-37] 28 (11/02 0454) BP: (121-130)/(61-75) 130/75 (11/02 0454) SpO2:  [92 %-97 %] 92 % (11/02 0454)      Intake/Output from previous day: 11/01 0701 - 11/02 0700 In: 1470 [P.O.:720; IV Piggyback:750] Out: 725 [Urine:725] Intake/Output this shift: No intake/output data recorded.  General appearance: alert, cooperative and no distress Heart: regular rate and rhythm Lungs: clear to auscultation, diminished breath sounds in the right lower lobe Abdomen: soft, non-tender; bowel sounds normal; no masses,  no organomegaly Extremities: extremities normal, atraumatic, no cyanosis or edema Wound: c/d/i without signs of infection  Lab Results:  Recent Labs  09/05/16 0352  WBC 8.8  HGB 9.5*  HCT 29.8*  PLT 424*   BMET: No results for input(s): NA, K, CL, CO2, GLUCOSE, BUN, CREATININE, CALCIUM in the last 72 hours.  PT/INR: No results for input(s): LABPROT, INR in the last 72 hours. ABG    Component Value Date/Time   PHART 7.384 08/30/2016 0515   HCO3 25.5 08/30/2016 0515   TCO2 25 08/30/2016 1839   ACIDBASEDEF 2.0 08/29/2016 1230   O2SAT 98.7 08/30/2016 0515   CBG (last 3)  No results for input(s): GLUCAP in the last 72 hours.  Assessment/Plan: S/P Procedure(s) (LRB): VIDEO ASSISTED THORACOSCOPY (VATS)/ DRAINAGE EMPYEMA (Right) THORACOTOMY MAJOR (Right)  1. Steady improvement 2. Empyema-will require 2 weeks of IV  antibiotics.  3. No positive blood cultures at [place] or at [place]. Per ID oral antibiotics is okay. 4. No leukocytosis and no fevers. 5. Pain control- Toradol and oxycodone  Plan: Discharge when SNF bed for remainder of IV Vancomycin. Oral Linezolid per Id. Case management and social work on board to coordinate care. Repeat CT scan of the chest before discharge?   LOS: 16 days    Sharlene Doryessa N Conte 09/06/2016  may go home on po Linezolid 600 bid for 2 weeks but needs to stay on iv Vanc while in hospital Last CT chest ok - no need to repeat P Zenaida NieceVan trigt

## 2016-09-07 ENCOUNTER — Inpatient Hospital Stay (HOSPITAL_COMMUNITY): Payer: Self-pay

## 2016-09-07 LAB — BASIC METABOLIC PANEL
Anion gap: 6 (ref 5–15)
BUN: 9 mg/dL (ref 6–20)
CO2: 23 mmol/L (ref 22–32)
Calcium: 7.4 mg/dL — ABNORMAL LOW (ref 8.9–10.3)
Chloride: 110 mmol/L (ref 101–111)
Creatinine, Ser: 0.67 mg/dL (ref 0.61–1.24)
GFR calc Af Amer: >60 mL/min (ref >60)
GFR calc non Af Amer: >60 mL/min (ref >60)
Glucose, Bld: 86 mg/dL (ref 65–99)
Potassium: 3.6 mmol/L (ref 3.5–5.1)
Sodium: 139 mmol/L (ref 135–145)

## 2016-09-07 MED ORDER — OXYCODONE HCL 10 MG PO TABS: 10.0000 mg | ORAL_TABLET | Freq: Four times a day (QID) | ORAL | 0 refills | Status: DC | PRN

## 2016-09-07 MED ORDER — ALPRAZOLAM 0.25 MG PO TABS: 0.2500 mg | ORAL_TABLET | Freq: Two times a day (BID) | ORAL | 0 refills | Status: DC

## 2016-09-07 MED FILL — METOPROLOL TARTRATE 25 MG T: 25 | 30 days supply | Qty: 30 | Fill #0

## 2016-09-07 NOTE — Progress Notes (Signed)
      [address]       [place] [id number]             [phone]      9 Days Post-Op Procedure(s) (LRB): VIDEO ASSISTED THORACOSCOPY (VATS)/ DRAINAGE EMPYEMA (Right) THORACOTOMY MAJOR (Right) Subjective: Feeling much better today  Objective: Vital signs in last 24 hours: Temp:  [98 F (36.7 C)-98.9 F (37.2 C)] 98.6 F (37 C) (11/03 0716) Pulse Rate:  [96-125] 97 (11/03 0716) Cardiac Rhythm: Normal sinus rhythm (11/03 0716) Resp:  [19-35] 30 (11/03 0716) BP: (118-155)/(59-89) 135/72 (11/03 0716) SpO2:  [93 %-98 %] 94 % (11/02 2351)  Hemodynamic parameters for last 24 hours:    Intake/Output from previous day: 11/02 0701 - 11/03 0700 In: 870 [P.O.:120; IV Piggyback:750] Out: 1300 [Urine:1300] Intake/Output this shift: Total I/O In: 250 [IV Piggyback:250] Out: -   General appearance: alert, cooperative and no distress Heart: regular rate and rhythm Lungs: clear to auscultation bilaterally Abdomen: soft, non-tender; bowel sounds normal; no masses,  no organomegaly Extremities: extremities normal, atraumatic, no cyanosis or edema Wound: c/d/i without drainage  Lab Results:  Recent Labs  09/05/16 0352  WBC 8.8  HGB 9.5*  HCT 29.8*  PLT 424*   BMET:  Recent Labs  09/07/16 0432  NA 139  K 3.6  CL 110  CO2 23  GLUCOSE 86  BUN 9  CREATININE 0.67  CALCIUM 7.4*    PT/INR: No results for input(s): LABPROT, INR in the last 72 hours. ABG    Component Value Date/Time   PHART 7.384 08/30/2016 0515   HCO3 25.5 08/30/2016 0515   TCO2 25 08/30/2016 1839   ACIDBASEDEF 2.0 08/29/2016 1230   O2SAT 98.7 08/30/2016 0515   CBG (last 3)  No results for input(s): GLUCAP in the last 72 hours.  Assessment/Plan: S/P Procedure(s) (LRB): VIDEO ASSISTED THORACOSCOPY (VATS)/ DRAINAGE EMPYEMA (Right) THORACOTOMY MAJOR (Right)  1. Steady improvement 2. Empyema-will require 4 weeks PO antibiotics  3. No positive blood cultures at [facility] or at  [facility]. Per ID oral antibiotics is okay. 4. No leukocytosis and no fevers. 5. Pain control- Toradol and oxycodone 6. CXR: Persistent volume loss on the right consistent with residual pleural fluid -empyema as well as right mid and lower lung atelectasis or pneumonia.  Discharge today.   LOS: 17 days    Sharlene Doryessa N Kayliee Atienza 09/07/2016

## 2016-09-07 NOTE — Progress Notes (Signed)
Pharmacy Antibiotic Note  Bryan Jennings is a 25 y.o. male with MRSA empyema s/p R VATS on 10/24.  History of IVDA  Pharmacy has been consulted for vancomycin dosing. Plans noted for 4 weeks of po zyvox. -SCr= 0.67, CrCl > 100  -Spoke with Dr. Alla GermanVantright: continue vancomycin until discharge then begin po zyvox. Discontinue Cymbalta (due to risk of serotonin syndrome with Zyvox)  Plan: -Continue Vancomycin 1250 mg IV q8h -Weekly vancomycin level while inpatient   Height: 5\' 10"  (177.8 cm) Weight: 252 lb 3.3 oz (114.4 kg) IBW/kg (Calculated) : 73  Temp (24hrs), Avg:98.4 F (36.9 C), Min:98 F (36.7 C), Max:98.9 F (37.2 C)   Recent Labs Lab 09/01/16 0415 09/01/16 1430 09/03/16 2212 09/05/16 0352 09/07/16 0432  WBC 12.9*  --   --  8.8  --   CREATININE 0.72  --   --   --  0.67  VANCOTROUGH  --  11* 19  --   --     Estimated Creatinine Clearance: 178.9 mL/min (by C-G formula based on SCr of 0.67 mg/dL).    No Known Allergies   Bryan Jennings, Pharm D 09/07/2016 10:30 AM

## 2016-09-07 NOTE — Progress Notes (Signed)
abx will not be ready at pharmacy until after 12 noon.  NCM will pick up at outpatient pharmacy for patient.

## 2016-09-12 ENCOUNTER — Inpatient Hospital Stay: Payer: Self-pay | Admitting: Critical Care Medicine

## 2016-09-18 ENCOUNTER — Other Ambulatory Visit: Payer: Self-pay | Admitting: Cardiothoracic Surgery

## 2016-09-18 DIAGNOSIS — J90 Pleural effusion, not elsewhere classified: Secondary | ICD-10-CM

## 2016-09-19 ENCOUNTER — Other Ambulatory Visit: Payer: Self-pay | Admitting: *Deleted

## 2016-09-19 ENCOUNTER — Ambulatory Visit (INDEPENDENT_AMBULATORY_CARE_PROVIDER_SITE_OTHER): Payer: Self-pay | Admitting: Cardiothoracic Surgery

## 2016-09-19 ENCOUNTER — Ambulatory Visit
Admission: RE | Admit: 2016-09-19 | Discharge: 2016-09-19 | Disposition: A | Discharge status: Home / Self Care | Payer: No Typology Code available for payment source | Source: Ambulatory Visit | Attending: Cardiothoracic Surgery | Admitting: Cardiothoracic Surgery

## 2016-09-19 ENCOUNTER — Encounter: Payer: Self-pay | Admitting: Cardiothoracic Surgery

## 2016-09-19 VITALS — BP 152/101 | HR 102 | Resp 16 | Ht 70.0 in | Wt 250.0 lb

## 2016-09-19 DIAGNOSIS — J90 Pleural effusion, not elsewhere classified: Secondary | ICD-10-CM

## 2016-09-19 DIAGNOSIS — F419 Anxiety disorder, unspecified: Secondary | ICD-10-CM

## 2016-09-19 DIAGNOSIS — J869 Pyothorax without fistula: Secondary | ICD-10-CM

## 2016-09-19 DIAGNOSIS — Z09 Encounter for follow-up examination after completed treatment for conditions other than malignant neoplasm: Secondary | ICD-10-CM

## 2016-09-19 MED ORDER — GABAPENTIN 300 MG PO CAPS: 300.0000 mg | ORAL_CAPSULE | Freq: Three times a day (TID) | ORAL | 1 refills | Status: DC

## 2016-09-19 NOTE — Progress Notes (Signed)
PCP is No PCP Per Patient Referring Provider is Randel PiggSilva Zapata, Dorma RussellEdwin, MD  Chief Complaint  Patient presents with  . Routine Post Op    f/u s/p R Mini Thoracotomy for EMPYEMA DRAINAGE/DECORT 08/29/16 with a CXR    HPI: 3 weeks postop visit after right VATS-drainage of empyema and MRSA bacteremia Patient taking home oral antibiotic Zyvox-4 week total He has not used any illicit drugs He is taking oxycodone for pain twice daily and will be weaning it to once daily and then off He is having typical post thoracotomy pain on the right side Surgical incisions healing well  Past Medical History:  Diagnosis Date  . Hypertension   . Substance abuse     Past Surgical History:  Procedure Laterality Date  . APPENDECTOMY    . THORACOTOMY Right 08/29/2016   Procedure: THORACOTOMY MAJOR;  Surgeon: Kerin PernaPeter Van Trigt, MD;  [facility];  Service: Thoracic;  Laterality: Right;  Marland Kitchen. VIDEO ASSISTED THORACOSCOPY (VATS)/EMPYEMA Right 08/29/2016   Procedure: VIDEO ASSISTED THORACOSCOPY (VATS)/ DRAINAGE EMPYEMA;  Surgeon: Kerin PernaPeter Van Trigt, MD;  [facility];  Service: Thoracic;  Laterality: Right;    Family History  Problem Relation Age of Onset  . Diabetes Mother   . Hypertension Mother   . CAD Father 6864    Social History Social History  Substance Use Topics  . Smoking status: Current Every Day Smoker    Packs/day: 1.00    Years: 12.00    Types: Cigarettes  . Smokeless tobacco: Never Used  . Alcohol use Yes     Comment: etoh abuse - reports unsure when he last used, "I just don't like it"    Current Outpatient Prescriptions  Medication Sig Dispense Refill  . ibuprofen (ADVIL,MOTRIN) 800 MG tablet Take 800 mg by mouth every 6 (six) hours as needed for moderate pain.    Marland Kitchen. linezolid (ZYVOX) 600 MG tablet Take 1 tablet (600 mg total) by mouth 2 (two) times daily. 60 tablet 0  . metoprolol tartrate (LOPRESSOR) 25 MG tablet Take 0.5 tablets (12.5 mg total) by mouth 2 (two) times daily. 30  tablet 1  . Oxycodone HCl 10 MG TABS Take 1 tablet (10 mg total) by mouth every 6 (six) hours as needed. 30 tablet 0  . ALPRAZolam (XANAX) 0.25 MG tablet Take 1 tablet (0.25 mg total) by mouth 2 (two) times daily. (Patient not taking: Reported on 09/19/2016) 14 tablet 0  . gabapentin (NEURONTIN) 300 MG capsule Take 1 capsule (300 mg total) by mouth 3 (three) times daily. For withdrawal related anxiety symptoms 90 capsule 1   No current facility-administered medications for this visit.     No Known Allergies  Review of Systems   Patient has entered outpatient drug rehabilitation halfway house He is working  BP (!) 152/101 (BP Location: Left Arm, Patient Position: Sitting, Cuff Size: Large)   Pulse (!) 102   Resp 16   Ht 5\' 10"  (1.778 m)   Wt 250 lb (113.4 kg)   BMI 35.87 kg/m  Physical Exam      Exam    General- alert and comfortable   Lungs- clear without rales, wheezes   Cor- regular rate and rhythm, no murmur , gallop   Abdomen- soft, non-tender   Extremities - warm, non-tender, minimal edema   Neuro- oriented, appropriate, no focal weakness   Diagnostic Tests: Chest x-ray without recurrent pleural disease-empyema Lungs are clear Elevated right hemidiaphragm-present preop Impression:   Plan: Finish oral antibiotics He was  given a short refill for pain medicine for his postop thoracotomy pain-he will give the pill bile to his brother He will receive no further pain prescription medications from this office-he understands He'll return as needed   Mikey BussingPeter Van Trigt III, MD [facility] [phone]

## 2016-10-12 LAB — ACID FAST CULTURE WITH REFLEXED SENSITIVITIES (MYCOBACTERIA): Acid Fast Culture: NEGATIVE

## 2016-10-18 ENCOUNTER — Inpatient Hospital Stay: Payer: Self-pay | Admitting: Internal Medicine

## 2016-11-12 ENCOUNTER — Other Ambulatory Visit: Payer: Self-pay | Admitting: *Deleted

## 2016-11-12 DIAGNOSIS — M792 Neuralgia and neuritis, unspecified: Secondary | ICD-10-CM

## 2016-11-12 DIAGNOSIS — F419 Anxiety disorder, unspecified: Secondary | ICD-10-CM

## 2016-11-12 MED ORDER — GABAPENTIN 300 MG PO CAPS: 300.0000 mg | ORAL_CAPSULE | Freq: Three times a day (TID) | ORAL | 1 refills | Status: DC

## 2016-11-25 ENCOUNTER — Encounter (HOSPITAL_COMMUNITY): Payer: Self-pay | Admitting: Emergency Medicine

## 2016-11-25 ENCOUNTER — Emergency Department (HOSPITAL_COMMUNITY)
Admission: EM | Admit: 2016-11-25 | Discharge: 2016-11-25 | Disposition: A | Discharge status: Home / Self Care | Payer: Self-pay | Attending: Emergency Medicine | Admitting: Emergency Medicine

## 2016-11-25 DIAGNOSIS — F17210 Nicotine dependence, cigarettes, uncomplicated: Secondary | ICD-10-CM | POA: Insufficient documentation

## 2016-11-25 DIAGNOSIS — Z760 Encounter for issue of repeat prescription: Secondary | ICD-10-CM | POA: Insufficient documentation

## 2016-11-25 DIAGNOSIS — F419 Anxiety disorder, unspecified: Secondary | ICD-10-CM | POA: Insufficient documentation

## 2016-11-25 DIAGNOSIS — I10 Essential (primary) hypertension: Secondary | ICD-10-CM | POA: Insufficient documentation

## 2016-11-25 LAB — CBC WITH DIFFERENTIAL/PLATELET
BASOS ABS: 0.1 10*3/uL (ref 0.0–0.1)
BASOS PCT: 1 %
Eosinophils Absolute: 0.2 10*3/uL (ref 0.0–0.7)
Eosinophils Relative: 2 %
HEMATOCRIT: 46.8 % (ref 39.0–52.0)
HEMOGLOBIN: 15.9 g/dL (ref 13.0–17.0)
LYMPHS PCT: 26 %
Lymphs Abs: 3.1 10*3/uL (ref 0.7–4.0)
MCH: 30.1 pg (ref 26.0–34.0)
MCHC: 34 g/dL (ref 30.0–36.0)
MCV: 88.6 fL (ref 78.0–100.0)
Monocytes Absolute: 1.2 10*3/uL — ABNORMAL HIGH (ref 0.1–1.0)
Monocytes Relative: 10 %
NEUTROS ABS: 7.2 10*3/uL (ref 1.7–7.7)
NEUTROS PCT: 61 %
Platelets: 346 10*3/uL (ref 150–400)
RBC: 5.28 MIL/uL (ref 4.22–5.81)
RDW: 14.1 % (ref 11.5–15.5)
WBC: 11.7 10*3/uL — ABNORMAL HIGH (ref 4.0–10.5)

## 2016-11-25 LAB — BASIC METABOLIC PANEL
ANION GAP: 10 (ref 5–15)
BUN: 21 mg/dL — ABNORMAL HIGH (ref 6–20)
CALCIUM: 9.1 mg/dL (ref 8.9–10.3)
CHLORIDE: 105 mmol/L (ref 101–111)
CO2: 24 mmol/L (ref 22–32)
Creatinine, Ser: 0.68 mg/dL (ref 0.61–1.24)
GFR calc non Af Amer: >60 mL/min (ref >60)
GLUCOSE: 97 mg/dL (ref 65–99)
POTASSIUM: 3.5 mmol/L (ref 3.5–5.1)
Sodium: 139 mmol/L (ref 135–145)

## 2016-11-25 MED ORDER — METOPROLOL TARTRATE 25 MG PO TABS
12.5000 mg | ORAL_TABLET | Freq: Once | ORAL | Status: AC
  Administered 2016-11-25: 12.5 mg via ORAL
  Filled 2016-11-25: qty 1

## 2016-11-25 MED ORDER — ALPRAZOLAM 0.25 MG PO TABS: 0.2500 mg | ORAL_TABLET | Freq: Two times a day (BID) | ORAL | 0 refills | Status: DC

## 2016-11-25 MED ORDER — GABAPENTIN 300 MG PO CAPS: 300.0000 mg | ORAL_CAPSULE | Freq: Three times a day (TID) | ORAL | 0 refills | Status: DC

## 2016-11-25 MED ORDER — METOPROLOL TARTRATE 25 MG PO TABS: 12.5000 mg | ORAL_TABLET | Freq: Two times a day (BID) | ORAL | 0 refills | Status: DC

## 2016-11-25 NOTE — ED Triage Notes (Signed)
Patient states that he has surgery back in November and states that his BP and Hr was elevated.  Patient states that been having BP and Hr been elevated.   Patient was having panic attack and male visitor gave patient Xanax 0.5mg  couple hours ago.  Patient doesn't have insurance.

## 2016-11-25 NOTE — Discharge Instructions (Signed)
Take your medications as prescribed. Follow-up with a primary care provider's office listed below within the next week for follow-up evaluation and continued management regarding her medical problems.  Please return to the Emergency Department if symptoms worsen or new onset of fever, chest pain, difficulty breathing, lightheadedness, dizziness, vomiting, abdominal pain, numbness, tingling, weakness, CP, seizure.

## 2016-11-25 NOTE — ED Provider Notes (Signed)
[facility]-EMERGENCY DEPT Provider Note   CSN: [id number] Arrival date & time: 11/25/16  1643     History   Chief Complaint Chief Complaint  Patient presents with  . Hypertension  . Tachycardia  . Anxiety    HPI Bryan Jennings is a 26 y.o. male.  HPI   Patient is a 26 year old male with history of hypertension and anxiety who presents the ED with complaint of heart palpitations and elevated blood pressure. Patient reports running out of his medications a few days ago for his anxiety and BP. He states since he has been able to take his medications he has had intermittent heart palpitations which he states typically occur when he becomes more anxious or stressed. He also reports he has been monitoring his heart rate and blood pressure at home and notes his heart rate has been elevated during these episodes; reports a blood pressure 160/100. Patient reports he took his last Xanax pill around 6 PM with improvement of his symptoms. Denies any pain or complaints at this time. Denies fever, lightheadedness, dizziness, visual changes, chest pain, difficulty breathing, abdominal pain, vomiting, numbness, tingling, weakness. Pt requesting refill for xanax, gabapentin and metoprolol.   Past Medical History:  Diagnosis Date  . Hypertension   . Substance abuse     Patient Active Problem List   Diagnosis Date Noted  . Encounter for imaging study to confirm orogastric (OG) tube placement   . Empyema lung (HCC)   . Substance abuse   . Pleural effusion on right   . Sepsis (HCC) 08/22/2016  . Opiate withdrawal (HCC) 08/22/2016  . Hyponatremia 08/22/2016  . Hypokalemia 08/22/2016  . Essential hypertension 08/22/2016  . Opioid-induced mood disorder (HCC)   . Community acquired pneumonia 08/21/2016  . S/P alcohol detoxification 11/04/2013  . Polysubstance dependence including opioid type drug, episodic abuse (HCC) 11/04/2013  . PTSD (post-traumatic stress disorder) 11/04/2013  . Depressive disorder,  not elsewhere classified 11/04/2013    Past Surgical History:  Procedure Laterality Date  . APPENDECTOMY    . THORACOTOMY Right 08/29/2016   Procedure: THORACOTOMY MAJOR;  Surgeon: Kerin PernaPeter Van Trigt, MD;  [facility];  Service: Thoracic;  Laterality: Right;  Marland Kitchen. VIDEO ASSISTED THORACOSCOPY (VATS)/EMPYEMA Right 08/29/2016   Procedure: VIDEO ASSISTED THORACOSCOPY (VATS)/ DRAINAGE EMPYEMA;  Surgeon: Kerin PernaPeter Van Trigt, MD;  [facility];  Service: Thoracic;  Laterality: Right;       Home Medications    Prior to Admission medications   Medication Sig Start Date End Date Taking? Authorizing Provider  ALPRAZolam (XANAX) 0.25 MG tablet Take 1 tablet (0.25 mg total) by mouth 2 (two) times daily. 09/07/16  Yes Sharlene Doryessa N Conte, PA-C  gabapentin (NEURONTIN) 300 MG capsule Take 1 capsule (300 mg total) by mouth 3 (three) times daily. For withdrawal related anxiety symptoms 11/12/16  Yes Kerin PernaPeter Van Trigt, MD  metoprolol tartrate (LOPRESSOR) 25 MG tablet Take 0.5 tablets (12.5 mg total) by mouth 2 (two) times daily. 09/06/16  Yes Sharlene Doryessa N Conte, PA-C  Oxycodone HCl 10 MG TABS Take 1 tablet (10 mg total) by mouth every 6 (six) hours as needed. Patient taking differently: Take 10 mg by mouth every 6 (six) hours as needed (pain).  09/07/16  Yes Sharlene Doryessa N Conte, PA-C  ALPRAZolam (XANAX) 0.25 MG tablet Take 1 tablet (0.25 mg total) by mouth 2 (two) times daily. 11/25/16   Barrett HenleNicole Elizabeth Nadeau, PA-C  gabapentin (NEURONTIN) 300 MG capsule Take 1 capsule (300 mg total) by mouth 3 (three) times daily.  11/25/16   Barrett Henle, PA-C  linezolid (ZYVOX) 600 MG tablet Take 1 tablet (600 mg total) by mouth 2 (two) times daily. Patient not taking: Reported on 11/25/2016 09/07/16   Sharlene Dory, PA-C  metoprolol (LOPRESSOR) 25 MG tablet Take 0.5 tablets (12.5 mg total) by mouth 2 (two) times daily. 11/25/16   Barrett Henle, PA-C    Family History Family History  Problem Relation Age of Onset  . Diabetes  Mother   . Hypertension Mother   . CAD Father 12    Social History Social History  Substance Use Topics  . Smoking status: Current Every Day Smoker    Packs/day: 1.00    Years: 12.00    Types: Cigarettes  . Smokeless tobacco: Never Used  . Alcohol use Yes     Comment: etoh abuse - reports unsure when he last used, "I just don't like it"     Allergies   Patient has no known allergies.   Review of Systems Review of Systems  Cardiovascular: Positive for palpitations.  All other systems reviewed and are negative.    Physical Exam Updated Vital Signs BP 122/97   Pulse 75   Temp 98.8 F (37.1 C) (Oral)   Resp 14   SpO2 100%   Physical Exam  Constitutional: He is oriented to person, place, and time. He appears well-developed and well-nourished.  Pt appears anxious on exam.   HENT:  Head: Normocephalic and atraumatic.  Mouth/Throat: Uvula is midline, oropharynx is clear and moist and mucous membranes are normal. No oropharyngeal exudate, posterior oropharyngeal edema, posterior oropharyngeal erythema or tonsillar abscesses. No tonsillar exudate.  Eyes: Conjunctivae and EOM are normal. Right eye exhibits no discharge. Left eye exhibits no discharge. No scleral icterus.  Neck: Normal range of motion. Neck supple.  Cardiovascular: Normal rate, regular rhythm, normal heart sounds and intact distal pulses.   HR 92  Pulmonary/Chest: Effort normal and breath sounds normal. No respiratory distress. He has no wheezes. He has no rales. He exhibits no tenderness.  Abdominal: Soft. Bowel sounds are normal. He exhibits no distension and no mass. There is no tenderness. There is no rebound and no guarding. No hernia.  Musculoskeletal: Normal range of motion. He exhibits no edema.  Neurological: He is alert and oriented to person, place, and time.  Skin: Skin is warm and dry. He is not diaphoretic.  Nursing note and vitals reviewed.    ED Treatments / Results  Labs (all labs  ordered are listed, but only abnormal results are displayed) Labs Reviewed  CBC WITH DIFFERENTIAL/PLATELET - Abnormal; Notable for the following:       Result Value   WBC 11.7 (*)    Monocytes Absolute 1.2 (*)    All other components within normal limits  BASIC METABOLIC PANEL - Abnormal; Notable for the following:    BUN 21 (*)    All other components within normal limits    EKG  EKG Interpretation None       Radiology No results found.  Procedures Procedures (including critical care time)  Medications Ordered in ED Medications  metoprolol tartrate (LOPRESSOR) tablet 12.5 mg (12.5 mg Oral Given 11/25/16 2228)     Initial Impression / Assessment and Plan / ED Course  I have reviewed the triage vital signs and the nursing notes.  Pertinent labs & imaging results that were available during my care of the patient were reviewed by me and considered in my medical decision making (see chart  for details).     Patient presents with complaint of elevated blood pressure and heart rate over the past 2 days. He reports running out of his home meds 2 days ago. Patient reports he was prescribed prescriptions by provider months ago but notes he currently does not have a PCP. Reports taking his last seen next tablet earlier this afternoon with resolution of symptoms. Denies any pain or complete to this time. VSS, HR 92, BP 136/95. On exam patient appeared anxious but otherwise in no acute distress. Exam unremarkable. RRR. Lungs CTAB. Patient given his home dose of metoprolol daily. On reevaluation patient denies any pain or complaints at this time. EKG showed sinus rhythm with no acute abnormality or arrhythmia noted. Labs unremarkable. Suspect patient's symptoms are likely due to his anxiety. Plan discharge patient home with  Short-term rx of Xanax and month rx of metoprolol and gabapentin. Patient given information for outpatient PCP follow-up. Discussed return precautions. Patient remained  hemodynamically stable prior to discharge.  Final Clinical Impressions(s) / ED Diagnoses   Final diagnoses:  Anxiety  Medication refill    New Prescriptions Discharge Medication List as of 11/25/2016 10:51 PM    START taking these medications   Details  !! ALPRAZolam (XANAX) 0.25 MG tablet Take 1 tablet (0.25 mg total) by mouth 2 (two) times daily., Starting Sun 11/25/2016, Print    !! gabapentin (NEURONTIN) 300 MG capsule Take 1 capsule (300 mg total) by mouth 3 (three) times daily., Starting Sun 11/25/2016, Print    !! metoprolol (LOPRESSOR) 25 MG tablet Take 0.5 tablets (12.5 mg total) by mouth 2 (two) times daily., Starting Sun 11/25/2016, Print     !! - Potential duplicate medications found. Please discuss with provider.       Satira Sark [place], [place] 11/25/16 2342    Lavera Guise, MD 11/27/16 [phone]

## 2016-11-28 ENCOUNTER — Ambulatory Visit (HOSPITAL_COMMUNITY)
Admission: RE | Admit: 2016-11-28 | Discharge: 2016-11-28 | Disposition: A | Discharge status: Home / Self Care | Payer: Self-pay | Attending: Psychiatry | Admitting: Psychiatry

## 2016-11-28 ENCOUNTER — Emergency Department (HOSPITAL_COMMUNITY)
Admission: EM | Admit: 2016-11-28 | Discharge: 2016-11-28 | Disposition: A | Discharge status: Home / Self Care | Payer: Self-pay | Attending: Emergency Medicine | Admitting: Emergency Medicine

## 2016-11-28 ENCOUNTER — Encounter (HOSPITAL_COMMUNITY): Payer: Self-pay | Admitting: Emergency Medicine

## 2016-11-28 DIAGNOSIS — F17210 Nicotine dependence, cigarettes, uncomplicated: Secondary | ICD-10-CM | POA: Insufficient documentation

## 2016-11-28 DIAGNOSIS — I10 Essential (primary) hypertension: Secondary | ICD-10-CM | POA: Insufficient documentation

## 2016-11-28 DIAGNOSIS — Z5321 Procedure and treatment not carried out due to patient leaving prior to being seen by health care provider: Secondary | ICD-10-CM | POA: Insufficient documentation

## 2016-11-28 DIAGNOSIS — Z046 Encounter for general psychiatric examination, requested by authority: Secondary | ICD-10-CM | POA: Insufficient documentation

## 2016-11-28 NOTE — ED Notes (Signed)
Writer attempted blood work, 2 X unsuccessful

## 2016-11-28 NOTE — ED Notes (Signed)
Pt stated he was just waiting on a ride and didn't actually want to seen.  Pt did not sign out AMA. Left without being seen.

## 2016-11-28 NOTE — ED Triage Notes (Signed)
[facility]. Patient denies SI/HI/AVH. Patient states he had a friend that just killed himself. Patient denies taking any medications, alcohol use, or drug use.

## 2016-12-09 ENCOUNTER — Inpatient Hospital Stay (HOSPITAL_COMMUNITY)
Admission: EM | Admit: 2016-12-09 | Discharge: 2016-12-15 | DRG: 871 | Disposition: A | Discharge status: Home / Self Care | Payer: Self-pay | Attending: Internal Medicine | Admitting: Internal Medicine

## 2016-12-09 ENCOUNTER — Encounter (HOSPITAL_COMMUNITY): Payer: Self-pay

## 2016-12-09 ENCOUNTER — Emergency Department (HOSPITAL_COMMUNITY): Payer: Self-pay

## 2016-12-09 DIAGNOSIS — J869 Pyothorax without fistula: Secondary | ICD-10-CM

## 2016-12-09 DIAGNOSIS — Y92239 Unspecified place in hospital as the place of occurrence of the external cause: Secondary | ICD-10-CM | POA: Diagnosis not present

## 2016-12-09 DIAGNOSIS — F1920 Other psychoactive substance dependence, uncomplicated: Secondary | ICD-10-CM

## 2016-12-09 DIAGNOSIS — A419 Sepsis, unspecified organism: Principal | ICD-10-CM | POA: Diagnosis present

## 2016-12-09 DIAGNOSIS — G8929 Other chronic pain: Secondary | ICD-10-CM | POA: Diagnosis present

## 2016-12-09 DIAGNOSIS — J9601 Acute respiratory failure with hypoxia: Secondary | ICD-10-CM | POA: Diagnosis present

## 2016-12-09 DIAGNOSIS — M792 Neuralgia and neuritis, unspecified: Secondary | ICD-10-CM

## 2016-12-09 DIAGNOSIS — N141 Nephropathy induced by other drugs, medicaments and biological substances: Secondary | ICD-10-CM | POA: Diagnosis not present

## 2016-12-09 DIAGNOSIS — Z8249 Family history of ischemic heart disease and other diseases of the circulatory system: Secondary | ICD-10-CM

## 2016-12-09 DIAGNOSIS — T398X5A Adverse effect of other nonopioid analgesics and antipyretics, not elsewhere classified, initial encounter: Secondary | ICD-10-CM | POA: Diagnosis not present

## 2016-12-09 DIAGNOSIS — E669 Obesity, unspecified: Secondary | ICD-10-CM | POA: Diagnosis present

## 2016-12-09 DIAGNOSIS — F4310 Post-traumatic stress disorder, unspecified: Secondary | ICD-10-CM | POA: Diagnosis present

## 2016-12-09 DIAGNOSIS — N179 Acute kidney failure, unspecified: Secondary | ICD-10-CM | POA: Diagnosis not present

## 2016-12-09 DIAGNOSIS — F1120 Opioid dependence, uncomplicated: Secondary | ICD-10-CM | POA: Diagnosis present

## 2016-12-09 DIAGNOSIS — F1590 Other stimulant use, unspecified, uncomplicated: Secondary | ICD-10-CM | POA: Diagnosis present

## 2016-12-09 DIAGNOSIS — B9562 Methicillin resistant Staphylococcus aureus infection as the cause of diseases classified elsewhere: Secondary | ICD-10-CM | POA: Diagnosis present

## 2016-12-09 DIAGNOSIS — R6510 Systemic inflammatory response syndrome (SIRS) of non-infectious origin without acute organ dysfunction: Secondary | ICD-10-CM | POA: Diagnosis present

## 2016-12-09 DIAGNOSIS — D649 Anemia, unspecified: Secondary | ICD-10-CM | POA: Diagnosis present

## 2016-12-09 DIAGNOSIS — I10 Essential (primary) hypertension: Secondary | ICD-10-CM | POA: Diagnosis present

## 2016-12-09 DIAGNOSIS — Z833 Family history of diabetes mellitus: Secondary | ICD-10-CM

## 2016-12-09 DIAGNOSIS — F17210 Nicotine dependence, cigarettes, uncomplicated: Secondary | ICD-10-CM | POA: Diagnosis present

## 2016-12-09 DIAGNOSIS — Z6836 Body mass index (BMI) 36.0-36.9, adult: Secondary | ICD-10-CM

## 2016-12-09 DIAGNOSIS — F1194 Opioid use, unspecified with opioid-induced mood disorder: Secondary | ICD-10-CM | POA: Diagnosis present

## 2016-12-09 DIAGNOSIS — L02213 Cutaneous abscess of chest wall: Secondary | ICD-10-CM

## 2016-12-09 DIAGNOSIS — T508X5A Adverse effect of diagnostic agents, initial encounter: Secondary | ICD-10-CM | POA: Diagnosis not present

## 2016-12-09 DIAGNOSIS — T368X5A Adverse effect of other systemic antibiotics, initial encounter: Secondary | ICD-10-CM | POA: Diagnosis present

## 2016-12-09 DIAGNOSIS — J189 Pneumonia, unspecified organism: Secondary | ICD-10-CM | POA: Diagnosis present

## 2016-12-09 DIAGNOSIS — F1490 Cocaine use, unspecified, uncomplicated: Secondary | ICD-10-CM | POA: Diagnosis present

## 2016-12-09 LAB — RAPID URINE DRUG SCREEN, HOSP PERFORMED
AMPHETAMINES: POSITIVE — AB
BARBITURATES: NOT DETECTED
Benzodiazepines: POSITIVE — AB
Cocaine: POSITIVE — AB
OPIATES: NOT DETECTED
TETRAHYDROCANNABINOL: POSITIVE — AB

## 2016-12-09 LAB — CBC
HEMATOCRIT: 39.2 % (ref 39.0–52.0)
Hemoglobin: 13 g/dL (ref 13.0–17.0)
MCH: 29.5 pg (ref 26.0–34.0)
MCHC: 33.2 g/dL (ref 30.0–36.0)
MCV: 88.9 fL (ref 78.0–100.0)
PLATELETS: 268 10*3/uL (ref 150–400)
RBC: 4.41 MIL/uL (ref 4.22–5.81)
RDW: 13.9 % (ref 11.5–15.5)
WBC: 13.1 10*3/uL — AB (ref 4.0–10.5)

## 2016-12-09 LAB — BASIC METABOLIC PANEL
Anion gap: 13 (ref 5–15)
BUN: 7 mg/dL (ref 6–20)
CHLORIDE: 100 mmol/L — AB (ref 101–111)
CO2: 23 mmol/L (ref 22–32)
CREATININE: 0.89 mg/dL (ref 0.61–1.24)
Calcium: 8.8 mg/dL — ABNORMAL LOW (ref 8.9–10.3)
GFR calc Af Amer: >60 mL/min (ref >60)
GFR calc non Af Amer: >60 mL/min (ref >60)
GLUCOSE: 116 mg/dL — AB (ref 65–99)
Potassium: 3.8 mmol/L (ref 3.5–5.1)
SODIUM: 136 mmol/L (ref 135–145)

## 2016-12-09 LAB — TROPONIN I: Troponin I: <0.03 ng/mL (ref <0.03)

## 2016-12-09 LAB — I-STAT CG4 LACTIC ACID, ED: Lactic Acid, Venous: 0.79 mmol/L (ref 0.5–1.9)

## 2016-12-09 LAB — STREP PNEUMONIAE URINARY ANTIGEN: STREP PNEUMO URINARY ANTIGEN: NEGATIVE

## 2016-12-09 MED ORDER — ONDANSETRON HCL 4 MG/2ML IJ SOLN: 4.0000 mg | Freq: Four times a day (QID) | INTRAMUSCULAR | Status: DC | PRN

## 2016-12-09 MED ORDER — SODIUM CHLORIDE 0.9 % IV SOLN
1250.0000 mg | Freq: Three times a day (TID) | INTRAVENOUS | Status: DC
  Administered 2016-12-09 – 2016-12-10 (×3): 1250 mg via INTRAVENOUS
  Filled 2016-12-09 (×4): qty 1250

## 2016-12-09 MED ORDER — ENOXAPARIN SODIUM 40 MG/0.4ML ~~LOC~~ SOLN: 40.0000 mg | SUBCUTANEOUS | Status: DC

## 2016-12-09 MED ORDER — FAMOTIDINE IN NACL 20-0.9 MG/50ML-% IV SOLN
20.0000 mg | Freq: Two times a day (BID) | INTRAVENOUS | Status: DC
  Administered 2016-12-09 – 2016-12-12 (×7): 20 mg via INTRAVENOUS
  Filled 2016-12-09 (×7): qty 50

## 2016-12-09 MED ORDER — GABAPENTIN 300 MG PO CAPS
300.0000 mg | ORAL_CAPSULE | Freq: Three times a day (TID) | ORAL | Status: DC
  Administered 2016-12-09 – 2016-12-15 (×19): 300 mg via ORAL
  Filled 2016-12-09 (×19): qty 1

## 2016-12-09 MED ORDER — INFLUENZA VAC SPLIT QUAD 0.5 ML IM SUSY
0.5000 mL | PREFILLED_SYRINGE | INTRAMUSCULAR | Status: AC
  Administered 2016-12-10: 0.5 mL via INTRAMUSCULAR
  Filled 2016-12-09: qty 0.5

## 2016-12-09 MED ORDER — IOPAMIDOL (ISOVUE-300) INJECTION 61%
INTRAVENOUS | Status: AC
  Administered 2016-12-09: 75 mL
  Filled 2016-12-09: qty 75

## 2016-12-09 MED ORDER — PIPERACILLIN-TAZOBACTAM 3.375 G IVPB 30 MIN
3.3750 g | Freq: Once | INTRAVENOUS | Status: AC
  Administered 2016-12-09: 3.375 g via INTRAVENOUS
  Filled 2016-12-09: qty 50

## 2016-12-09 MED ORDER — FENTANYL CITRATE (PF) 100 MCG/2ML IJ SOLN
50.0000 ug | Freq: Once | INTRAMUSCULAR | Status: AC
  Administered 2016-12-09: 50 ug via INTRAVENOUS
  Filled 2016-12-09: qty 2

## 2016-12-09 MED ORDER — VANCOMYCIN HCL 10 G IV SOLR
2000.0000 mg | Freq: Once | INTRAVENOUS | Status: AC
  Administered 2016-12-09: 2000 mg via INTRAVENOUS
  Filled 2016-12-09: qty 2000

## 2016-12-09 MED ORDER — PANTOPRAZOLE SODIUM 40 MG IV SOLR
40.0000 mg | INTRAVENOUS | Status: DC
  Administered 2016-12-09 – 2016-12-12 (×4): 40 mg via INTRAVENOUS
  Filled 2016-12-09 (×4): qty 40

## 2016-12-09 MED ORDER — KETOROLAC TROMETHAMINE 30 MG/ML IJ SOLN
30.0000 mg | Freq: Four times a day (QID) | INTRAMUSCULAR | Status: DC
  Administered 2016-12-09 – 2016-12-13 (×18): 30 mg via INTRAVENOUS
  Filled 2016-12-09 (×19): qty 1

## 2016-12-09 MED ORDER — PNEUMOCOCCAL VAC POLYVALENT 25 MCG/0.5ML IJ INJ
0.5000 mL | INJECTION | INTRAMUSCULAR | Status: AC
  Administered 2016-12-15: 0.5 mL via INTRAMUSCULAR
  Filled 2016-12-09 (×2): qty 0.5

## 2016-12-09 MED ORDER — PIPERACILLIN-TAZOBACTAM 3.375 G IVPB
3.3750 g | Freq: Three times a day (TID) | INTRAVENOUS | Status: DC
  Administered 2016-12-09 – 2016-12-13 (×13): 3.375 g via INTRAVENOUS
  Filled 2016-12-09 (×14): qty 50

## 2016-12-09 MED ORDER — ACETAMINOPHEN 325 MG PO TABS
650.0000 mg | ORAL_TABLET | Freq: Four times a day (QID) | ORAL | Status: DC | PRN
  Administered 2016-12-09 – 2016-12-12 (×2): 650 mg via ORAL
  Filled 2016-12-09 (×2): qty 2

## 2016-12-09 MED ORDER — ONDANSETRON HCL 4 MG PO TABS: 4.0000 mg | ORAL_TABLET | Freq: Four times a day (QID) | ORAL | Status: DC | PRN

## 2016-12-09 MED ORDER — ALPRAZOLAM 0.25 MG PO TABS
0.2500 mg | ORAL_TABLET | Freq: Two times a day (BID) | ORAL | Status: DC
Start: 2016-12-09 — End: 2016-12-15
  Administered 2016-12-09 – 2016-12-15 (×13): 0.25 mg via ORAL
  Filled 2016-12-09 (×12): qty 1

## 2016-12-09 MED ORDER — SODIUM CHLORIDE 0.9 % IV SOLN
INTRAVENOUS | Status: DC
  Administered 2016-12-09 – 2016-12-14 (×7): via INTRAVENOUS

## 2016-12-09 MED ORDER — SODIUM CHLORIDE 0.9 % IV BOLUS (SEPSIS)
1000.0000 mL | Freq: Once | INTRAVENOUS | Status: AC
  Administered 2016-12-09: 1000 mL via INTRAVENOUS

## 2016-12-09 MED ORDER — SODIUM CHLORIDE 0.9% FLUSH
3.0000 mL | Freq: Two times a day (BID) | INTRAVENOUS | Status: DC
  Administered 2016-12-09 – 2016-12-15 (×5): 3 mL via INTRAVENOUS

## 2016-12-09 MED ORDER — ACETAMINOPHEN 650 MG RE SUPP: 650.0000 mg | Freq: Four times a day (QID) | RECTAL | Status: DC | PRN

## 2016-12-09 MED ORDER — ACETAMINOPHEN 325 MG PO TABS
ORAL_TABLET | ORAL | Status: AC
  Administered 2016-12-09: 650 mg
  Filled 2016-12-09: qty 2

## 2016-12-09 MED ORDER — GABAPENTIN 300 MG PO CAPS: 300.0000 mg | ORAL_CAPSULE | Freq: Three times a day (TID) | ORAL | Status: DC

## 2016-12-09 MED ORDER — METOPROLOL TARTRATE 12.5 MG HALF TABLET
12.5000 mg | ORAL_TABLET | Freq: Two times a day (BID) | ORAL | Status: DC
  Administered 2016-12-09 – 2016-12-15 (×11): 12.5 mg via ORAL
  Filled 2016-12-09 (×13): qty 1

## 2016-12-09 MED ORDER — OXYCODONE HCL 5 MG PO TABS
10.0000 mg | ORAL_TABLET | ORAL | Status: DC | PRN
  Administered 2016-12-09 – 2016-12-15 (×23): 10 mg via ORAL
  Filled 2016-12-09 (×23): qty 2

## 2016-12-09 NOTE — Consult Note (Signed)
Chief Complaint: Patient was seen in consultation today for CT-guided right chest wall abscess drainage Chief Complaint  Patient presents with  . Chest Pain    Referring Physician(s): Aleen SellsVan Trigt,V  Supervising Physician: Gilmer MorWagner, Jaime  [facility]  History of Present Illness: Bryan CageJames Kassem is a 26 y.o. male  with history of IV drug abuse who had a right VATS for MRSA empyema in October 2017. At that time he also had a right chest wall abscess that was also drained. The patient did not have extended IV antibiotic coverage for severe MRSA infection but was transitioned to oral Zyvox at the time of discharge. He states he has not used IV drugs. He presents today with right anterior lateral chest wall tenderness low-grade fever and white count. Chest CT scan shows a recurrent right chest wall abscess extending into the pleural space but separated by a fibrous capsule. There is no evidence of recurrent pleural space infection in the right lower chest at the site of the empyema.The patient is a nondiabetic. Vital signs are stable. Request now received for CT guided drainage of the right chest wall abscess. Past Medical History:  Diagnosis Date  . Hypertension   . Substance abuse     Past Surgical History:  Procedure Laterality Date  . APPENDECTOMY    . THORACOTOMY Right 08/29/2016   Procedure: THORACOTOMY MAJOR;  Surgeon: Kerin PernaPeter Van Trigt, MD;  [facility];  Service: Thoracic;  Laterality: Right;  Marland Kitchen. VIDEO ASSISTED THORACOSCOPY (VATS)/EMPYEMA Right 08/29/2016   Procedure: VIDEO ASSISTED THORACOSCOPY (VATS)/ DRAINAGE EMPYEMA;  Surgeon: Kerin PernaPeter Van Trigt, MD;  [facility];  Service: Thoracic;  Laterality: Right;    Allergies: Patient has no known allergies.  Medications: Prior to Admission medications   Medication Sig Start Date End Date Taking? Authorizing Provider  ALPRAZolam (XANAX) 0.25 MG tablet Take 1 tablet (0.25 mg total) by mouth 2 (two) times daily.  11/25/16  Yes Barrett HenleNicole Elizabeth Nadeau, PA-C  gabapentin (NEURONTIN) 300 MG capsule Take 1 capsule (300 mg total) by mouth 3 (three) times daily. For withdrawal related anxiety symptoms 11/12/16  Yes Kerin PernaPeter Van Trigt, MD  metoprolol tartrate (LOPRESSOR) 25 MG tablet Take 0.5 tablets (12.5 mg total) by mouth 2 (two) times daily. 09/06/16  Yes Sharlene Doryessa N Conte, PA-C  Oxycodone HCl 10 MG TABS Take 1 tablet (10 mg total) by mouth every 6 (six) hours as needed. Patient taking differently: Take 10 mg by mouth every 6 (six) hours as needed (pain).  09/07/16  Yes Sharlene Doryessa N Conte, PA-C     Family History  Problem Relation Age of Onset  . Diabetes Mother   . Hypertension Mother   . CAD Father 5064    Social History   Social History  . Marital status: Single    Spouse name: N/A  . Number of children: N/A  . Years of education: N/A   Occupational History  . remodels apartments    Social History Main Topics  . Smoking status: Current Every Day Smoker    Packs/day: 1.00    Years: 12.00    Types: Cigarettes  . Smokeless tobacco: Never Used  . Alcohol use Yes     Comment: etoh abuse - reports unsure when he last used, "I just don't like it"  . Drug use: Yes    Types: Cocaine, Heroin, Benzodiazepines     Comment: 12/09/16 last cocaine use 2 days ago  . Sexual activity: Yes    Birth control/  protection: Condom   Other Topics Concern  . None   Social History Narrative  . None     Review of Systems see above. Currently denies fever, abdominal pain, back pain, nausea, vomiting or abnormal bleeding. Does have right lateral chest wall discomfort, some dyspnea, occasional cough, occ HA.   Vital Signs: BP 119/80 (BP Location: Left Arm)   Pulse 92   Temp 97.7 F (36.5 C) (Oral)   Resp 19   Ht 5\' 10"  (1.778 m)   Wt 252 lb 1.6 oz (114.4 kg)   SpO2 97%   BMI 36.17 kg/m   Physical Exam awake, alert. Chest with diminished breath sounds right base, left clear. Right lateral chest wall with tenderness  to palpation; heart with regular rate and rhythm. Abdomen soft, positive bowel sounds, nontender. No lower extremity edema  Mallampati Score:     Imaging: Dg Chest 2 View  Result Date: 12/09/2016 CLINICAL DATA:  RIGHT rib pain. Diaphoresis. History of VATS and empyema drainage/thoracotomy October 2017. EXAM: CHEST  2 VIEW COMPARISON:  Chest radiograph September 19, 2016 and CT chest September 03, 2016 FINDINGS: Dense consolidation RIGHT lung base with likely loculated moderate pleural effusion. Elevated RIGHT hemidiaphragm with juxta peaking consistent with sub pleural effusion. Bandlike densities RIGHT lung base. LEFT lung is clear. Cardiomediastinal silhouette is normal. No pneumothorax. Soft tissue planes and included osseous structures are nonsuspicious. IMPRESSION: Increasing RIGHT effusion and consolidation concerning for recurrent empyema. RIGHT lung base atelectasis. Electronically Signed   By: Awilda Metro M.D.   On: 12/09/2016 02:01   Ct Chest W Contrast  Result Date: 12/09/2016 CLINICAL DATA:  Empyema.  History of substance abuse, thoracotomy. EXAM: CT CHEST WITH CONTRAST TECHNIQUE: Multidetector CT imaging of the chest was performed during intravenous contrast administration. CONTRAST:  75mL ISOVUE-300 IOPAMIDOL (ISOVUE-300) INJECTION 61% COMPARISON:  Chest radiograph December 09, 2016 at 0125 hours and CT chest September 03, 2016 FINDINGS: CARDIOVASCULAR: Heart and pericardium are unremarkable. Thoracic aorta is normal course and caliber, unremarkable. MEDIASTINUM/NODES: No mediastinal mass. Greater than expected number of prominent lymph nodes, largest lymph node measures up to 10 mm. 11 mm RIGHT supraclavicular fossa lymph node. LUNGS/PLEURA: Elevated RIGHT hemidiaphragm. RIGHT lateral pleural space rim enhancing fluid collection with chest wall invasion through the fourth through seventh intercostal spaces into the RIGHT chest wall muscles, collection measures 4.4 x 7.2 x 7.2 cm. Small  RIGHT pleural effusion and pleural thickening. Patchy ground-glass opacities bilateral lung involving all lobes. RIGHT upper, RIGHT middle lobe and to lesser extent RIGHT lower lobe scarring. Bronchial wall thickening. No pneumothorax. UPPER ABDOMEN: Nonacute. MUSCULOSKELETAL: Healing nondisplaced RIGHT anterior fifth rib fracture, abscess surrounds. IMPRESSION: RIGHT chest wall 4.4 x 7.2 x 7.2 cm abscess extending from pleural space to the RIGHT chest wall muscles. Bronchial wall thickening and scattered ground-glass opacities concerning for pneumonia. Borderline medial spinal lymphadenopathy is likely reactive. Electronically Signed   By: Awilda Metro M.D.   On: 12/09/2016 06:02    Labs:  CBC:  Recent Labs  09/01/16 0415 09/05/16 0352 11/25/16 2106 12/09/16 0113  WBC 12.9* 8.8 11.7* 13.1*  HGB 9.8* 9.5* 15.9 13.0  HCT 30.5* 29.8* 46.8 39.2  PLT 501* 424* 346 268    COAGS:  Recent Labs  08/28/16 0436 08/28/16 2112 08/29/16 0850  INR 1.13 1.08  --   APTT  --  31 30    BMP:  Recent Labs  09/01/16 0415 09/07/16 0432 11/25/16 2106 12/09/16 0113  NA 138 139 139 136  K 4.2 3.6 3.5 3.8  CL 104 110 105 100*  CO2 26 23 24 23   GLUCOSE 95 86 97 116*  BUN 10 9 21* 7  CALCIUM 8.2* 7.4* 9.1 8.8*  CREATININE 0.72 0.67 0.68 0.89  GFRNONAA >60 >60 >60 >60  GFRAA >60 >60 >60 >60    LIVER FUNCTION TESTS:  Recent Labs  08/28/16 2112 08/29/16 0802 08/31/16 0500  BILITOT 0.5 1.0 0.7  AST 54* 45* 44*  ALT 44 42 33  ALKPHOS 65 64 46  PROT 6.3* 6.2* 5.4*  ALBUMIN 2.1* 2.0* 1.8*    TUMOR MARKERS: No results for input(s): AFPTM, CEA, CA199, CHROMGRNA in the last 8760 hours.  Assessment and Plan: Patient with past history of IV drug abuse, prior right chest MRSA empyema/VATS October 2017. Now with recurrent chest wall and adjacent pleural space loculated abscess from probable recurrent MRSA. Request received for CT guided drainage of this right lateral chest wall  abscess. Imaging studies have been reviewed by Dr. Grace Isaac. Details/risks of procedure, including but not limited to, internal bleeding, infection, injury to adjacent structures, pneumothorax, discussed with patient with his understanding and consent. Procedure tentatively planned for 2/5.   Thank you for this interesting consult.  I greatly enjoyed meeting Vencent Hauschild and look forward to participating in their care.  A copy of this report was sent to the requesting provider on this date.  Electronically Signed: D. Jeananne Rama 12/09/2016, 10:25 AM   I spent a total of 30 minutes    in face to face in clinical consultation, greater than 50% of which was counseling/coordinating care for CT-guided right chest wall abscess drainage

## 2016-12-09 NOTE — ED Notes (Signed)
Lobby updated on delays in rooming and warm blankets offered 

## 2016-12-09 NOTE — ED Provider Notes (Signed)
[facility]-EMERGENCY DEPT Provider Note   CSN: [id number] Arrival date & time: 12/09/16  [id number]  By signing my name below, I, Bryan Jennings, attest that this documentation has been prepared under the direction and in the presence of Bryan Braunondell A Smith, MD. Electronically Signed: Elder Negusussell Jennings, Scribe. 12/09/16. 2:35 AM.   History   Chief Complaint Chief Complaint  Patient presents with  . Chest Pain    HPI Bryan Jennings is a 26 y.o. male with history of hypertension, polysubstance abuse, and R empyema s/p thoracotomy in 09/2016 who presents to the ED for evaluation of rib pain with dyspnea. This patient states that for the last week he has experienced R mid rib pain with dyspnea, cough, and subjective fevers. He does believe that his current complaints are similar to those he experienced during his previous admission for empyema. The patient does report history of cocaine and methamphetamine use previously however he states he has been clean for 90 days.   The history is provided by the patient. No language interpreter was used.    Past Medical History:  Diagnosis Date  . Hypertension   . Substance abuse     Patient Active Problem List   Diagnosis Date Noted  . Encounter for imaging study to confirm orogastric (OG) tube placement   . Empyema lung (HCC)   . Substance abuse   . Pleural effusion on right   . Sepsis (HCC) 08/22/2016  . Opiate withdrawal (HCC) 08/22/2016  . Hyponatremia 08/22/2016  . Hypokalemia 08/22/2016  . Essential hypertension 08/22/2016  . Opioid-induced mood disorder (HCC)   . Community acquired pneumonia 08/21/2016  . S/P alcohol detoxification 11/04/2013  . Polysubstance dependence including opioid type drug, episodic abuse (HCC) 11/04/2013  . PTSD (post-traumatic stress disorder) 11/04/2013  . Depressive disorder, not elsewhere classified 11/04/2013    Past Surgical History:  Procedure Laterality Date  . APPENDECTOMY    . THORACOTOMY Right 08/29/2016     Procedure: THORACOTOMY MAJOR;  Surgeon: Kerin PernaPeter Van Trigt, MD;  [facility];  Service: Thoracic;  Laterality: Right;  Marland Kitchen. VIDEO ASSISTED THORACOSCOPY (VATS)/EMPYEMA Right 08/29/2016   Procedure: VIDEO ASSISTED THORACOSCOPY (VATS)/ DRAINAGE EMPYEMA;  Surgeon: Kerin PernaPeter Van Trigt, MD;  [facility];  Service: Thoracic;  Laterality: Right;       Home Medications    Prior to Admission medications   Medication Sig Start Date End Date Taking? Authorizing Provider  ALPRAZolam (XANAX) 0.25 MG tablet Take 1 tablet (0.25 mg total) by mouth 2 (two) times daily. 09/07/16   Sharlene Doryessa N Conte, PA-C  ALPRAZolam (XANAX) 0.25 MG tablet Take 1 tablet (0.25 mg total) by mouth 2 (two) times daily. 11/25/16   Barrett HenleNicole Elizabeth Nadeau, PA-C  gabapentin (NEURONTIN) 300 MG capsule Take 1 capsule (300 mg total) by mouth 3 (three) times daily. For withdrawal related anxiety symptoms 11/12/16   Kerin PernaPeter Van Trigt, MD  gabapentin (NEURONTIN) 300 MG capsule Take 1 capsule (300 mg total) by mouth 3 (three) times daily. 11/25/16   Barrett HenleNicole Elizabeth Nadeau, PA-C  linezolid (ZYVOX) 600 MG tablet Take 1 tablet (600 mg total) by mouth 2 (two) times daily. Patient not taking: Reported on 11/25/2016 09/07/16   Sharlene Doryessa N Conte, PA-C  metoprolol (LOPRESSOR) 25 MG tablet Take 0.5 tablets (12.5 mg total) by mouth 2 (two) times daily. 11/25/16   Barrett HenleNicole Elizabeth Nadeau, PA-C  metoprolol tartrate (LOPRESSOR) 25 MG tablet Take 0.5 tablets (12.5 mg total) by mouth 2 (two) times daily. 09/06/16   Sharlene Doryessa N Conte, PA-C  Oxycodone HCl 10 MG TABS Take 1 tablet (10 mg total) by mouth every 6 (six) hours as needed. Patient taking differently: Take 10 mg by mouth every 6 (six) hours as needed (pain).  09/07/16   Sharlene Dory, PA-C    Family History Family History  Problem Relation Age of Onset  . Diabetes Mother   . Hypertension Mother   . CAD Father 57    Social History Social History  Substance Use Topics  . Smoking status: Current Every Day Smoker     Packs/day: 1.00    Years: 12.00    Types: Cigarettes  . Smokeless tobacco: Never Used  . Alcohol use Yes     Comment: etoh abuse - reports unsure when he last used, "I just don't like it"     Allergies   Patient has no known allergies.   Review of Systems Review of Systems 10 systems reviewed and all are negative for acute change except as noted in the HPI.  Physical Exam Updated Vital Signs BP 127/78   Pulse 95   Temp 100.5 F (38.1 C) (Oral)   Resp 21   Ht 5\' 10"  (1.778 m)   Wt 250 lb (113.4 kg)   SpO2 94%   BMI 35.87 kg/m   Physical Exam  Constitutional: He is oriented to person, place, and time. He appears well-developed and well-nourished. No distress.  HENT:  Head: Normocephalic and atraumatic.  Nose: Nose normal.  Eyes: Conjunctivae and EOM are normal. Pupils are equal, round, and reactive to light. Right eye exhibits no discharge. Left eye exhibits no discharge. No scleral icterus.  Neck: Normal range of motion. Neck supple.  Cardiovascular: Normal rate, regular rhythm and normal heart sounds.  Exam reveals no gallop and no friction rub.   No murmur heard. Tachycardic  Pulmonary/Chest: Effort normal. No stridor. No respiratory distress. He has decreased breath sounds in the right middle field and the right lower field. He has no rales.  Abdominal: Soft. He exhibits no distension. There is no tenderness.  Musculoskeletal: He exhibits no edema or tenderness.  Neurological: He is alert and oriented to person, place, and time.  Skin: Skin is warm and dry. No rash noted. He is not diaphoretic. No erythema.  Clammy, sweating.   Psychiatric: He has a normal mood and affect.  Vitals reviewed.    ED Treatments / Results  DIAGNOSTIC STUDIES: Oxygen Saturation is 92 percent on room air which is adequate by my interpretation.    COORDINATION OF CARE: 2:32 AM Discussed treatment plan with pt at bedside and pt agreed to plan.  Labs (all labs ordered are  listed, but only abnormal results are displayed) Labs Reviewed  BASIC METABOLIC PANEL - Abnormal; Notable for the following:       Result Value   Chloride 100 (*)    Glucose, Bld 116 (*)    Calcium 8.8 (*)    All other components within normal limits  CBC - Abnormal; Notable for the following:    WBC 13.1 (*)    All other components within normal limits  TROPONIN I  I-STAT CG4 LACTIC ACID, ED  I-STAT CG4 LACTIC ACID, ED    EKG  EKG Interpretation  Date/Time:  Sunday December 09 2016 01:07:16 EST Ventricular Rate:  130 PR Interval:  118 QRS Duration: 88 QT Interval:  304 QTC Calculation: 447 R Axis:   100 Text Interpretation:  Sinus tachycardia Rightward axis Borderline ECG Confirmed by [facility] MD, PEDRO ([id number]) on  12/09/2016 6:20:12 AM       Radiology Dg Chest 2 View  Result Date: 12/09/2016 CLINICAL DATA:  RIGHT rib pain. Diaphoresis. History of VATS and empyema drainage/thoracotomy October 2017. EXAM: CHEST  2 VIEW COMPARISON:  Chest radiograph September 19, 2016 and CT chest September 03, 2016 FINDINGS: Dense consolidation RIGHT lung base with likely loculated moderate pleural effusion. Elevated RIGHT hemidiaphragm with juxta peaking consistent with sub pleural effusion. Bandlike densities RIGHT lung base. LEFT lung is clear. Cardiomediastinal silhouette is normal. No pneumothorax. Soft tissue planes and included osseous structures are nonsuspicious. IMPRESSION: Increasing RIGHT effusion and consolidation concerning for recurrent empyema. RIGHT lung base atelectasis. Electronically Signed   By: Awilda Metro M.D.   On: 12/09/2016 02:01   Ct Chest W Contrast  Result Date: 12/09/2016 CLINICAL DATA:  Empyema.  History of substance abuse, thoracotomy. EXAM: CT CHEST WITH CONTRAST TECHNIQUE: Multidetector CT imaging of the chest was performed during intravenous contrast administration. CONTRAST:  75mL ISOVUE-300 IOPAMIDOL (ISOVUE-300) INJECTION 61% COMPARISON:  Chest radiograph  December 09, 2016 at 0125 hours and CT chest September 03, 2016 FINDINGS: CARDIOVASCULAR: Heart and pericardium are unremarkable. Thoracic aorta is normal course and caliber, unremarkable. MEDIASTINUM/NODES: No mediastinal mass. Greater than expected number of prominent lymph nodes, largest lymph node measures up to 10 mm. 11 mm RIGHT supraclavicular fossa lymph node. LUNGS/PLEURA: Elevated RIGHT hemidiaphragm. RIGHT lateral pleural space rim enhancing fluid collection with chest wall invasion through the fourth through seventh intercostal spaces into the RIGHT chest wall muscles, collection measures 4.4 x 7.2 x 7.2 cm. Small RIGHT pleural effusion and pleural thickening. Patchy ground-glass opacities bilateral lung involving all lobes. RIGHT upper, RIGHT middle lobe and to lesser extent RIGHT lower lobe scarring. Bronchial wall thickening. No pneumothorax. UPPER ABDOMEN: Nonacute. MUSCULOSKELETAL: Healing nondisplaced RIGHT anterior fifth rib fracture, abscess surrounds. IMPRESSION: RIGHT chest wall 4.4 x 7.2 x 7.2 cm abscess extending from pleural space to the RIGHT chest wall muscles. Bronchial wall thickening and scattered ground-glass opacities concerning for pneumonia. Borderline medial spinal lymphadenopathy is likely reactive. Electronically Signed   By: Awilda Metro M.D.   On: 12/09/2016 06:02    Procedures Procedures (including critical care time)  Medications Ordered in ED Medications  vancomycin (VANCOCIN) 2,000 mg in sodium chloride 0.9 % 500 mL IVPB (2,000 mg Intravenous New Bag/Given 12/09/16 0500)  acetaminophen (TYLENOL) 325 MG tablet (650 mg  Given 12/09/16 0202)  sodium chloride 0.9 % bolus 1,000 mL (0 mLs Intravenous Stopped 12/09/16 0635)  piperacillin-tazobactam (ZOSYN) IVPB 3.375 g (0 g Intravenous Stopped 12/09/16 0430)  fentaNYL (SUBLIMAZE) injection 50 mcg (50 mcg Intravenous Given 12/09/16 0315)  iopamidol (ISOVUE-300) 61 % injection (75 mLs  Contrast Given 12/09/16 0351)      Initial Impression / Assessment and Plan / ED Course  I have reviewed the triage vital signs and the nursing notes.  Pertinent labs & imaging results that were available during my care of the patient were reviewed by me and considered in my medical decision making (see chart for details).     Early initiation of antibiotics given chest x-ray with likely worsening empyema. Diagnosis confirmed by CT. Discussed case with Dr. Donata Clay who performed the previous VATS, and he recommended IV Abx with Hospitalist admission. He will continue to follow.  Final Clinical Impressions(s) / ED Diagnoses   Final diagnoses:  Empyema lung (HCC)   I personally performed the services described in this documentation, which was scribed in my presence. The recorded information has been  reviewed and is accurate.        Nira Conn, MD 12/09/16 [phone]

## 2016-12-09 NOTE — H&P (Signed)
History and Physical    Bryan Jennings ZOX:[id number] DOB: 21-Nov-1990 DOA: 12/09/2016   PCP: No PCP Per Patient Gentry Fitz  Patient coming from/Resides with: Private residence  Admission status: Inpatient/medically necessary to stay a minimum 2 midnights to rule out impending and/or unexpected changes in physiologic status that may differ from initial evaluation performed in the ER and/or at time of admission.   Chief Complaint: Right-sided rib pain and shortness of breath  HPI: Bryan Jennings is a 26 y.o. male with medical history significant for hypertension, PTSD, depressive disorder and polysubstance abuse as well as an admission during October 2017 for prior right pulmonary empyema S/P VATS procedure. Five days before initial presentation for that hospitalization he had overdosed on heroin, a bystander performed CPR, EMS administered Narcan and patient declined hospitalization. During surgical procedure for the empyema was discovered he had a minimally displaced fracture involving the anterior portion of the right fifth rib. He underwent successful detoxification during the hospitalization with a plan in place to attend Narcotics Anonymous meetings weekly. Unfortunately since discharge the patient has not remained clean and when he presented to triage today he admitted to utilizing cocaine and methamphetamine in the past 3 days. He presented this time with right-sided rib pain. He was diaphoretic and tachycardic with heart rates in the 130s and desaturations 89% on room air. CTA of the chest with contrast revealed a large rim-enhancing fluid collection in the chest wall invading to the fourth through the seventh intercostal spaces into the muscles. He also had a small right pleural effusion with pleural thickening as well as patchy groundglass opacities bilateral throughout all lobes concerning for pneumonia versus pneumonitis. EDP contacted CVTS on-call who will see the patient consultation. Patient  has been given a dose of fentanyl for pain and empiric broad-spectrum IV antibiotics.  ED Course:  Vital Signs: BP 144/80   Pulse 87   Temp 100.5 F (38.1 C) (Oral)   Resp 21   Ht 5\' 10"  (1.778 m)   Wt 113.4 kg (250 lb)   SpO2 93%   BMI 35.87 kg/m  2 view chest x-ray: Dense consolidation right lung base with likely loculated moderate pleural effusion. Bandlike densities in the right lung base with left lung documented is clear CT of the chest with contrast: Right chest wall 4 4 x 7.2 x 7.2 cm abscesses extending from pleural space into the right chest wall muscles; bronchial wall thickening and scattered groundglass opacities concerning for pneumonia with borderline spinal lymphadenopathy likely reactive Lab data: Sodium 136, potassium 3.8, chloride 100, CO2 23, glucose 116, BUN 7, creatinine 0.89, troponin less than 0.03, lactic acid 0.79, white count 13,100 differential not obtained, hemoglobin 13, platelets 268,000 Medications and treatments: Tylenol 650 mg 1, normal saline bolus 1 L, Zosyn 3.375 g IV 1, vancomycin 2000 mg IV 1, fentanyl 50 g IV 1  Review of Systems:  In addition to the HPI above,  No Headache, changes with Vision or hearing, new weakness, tingling, numbness in any extremity, dizziness, dysarthria or word finding difficulty, gait disturbance or imbalance, tremors or seizure activity No problems swallowing food or Liquids, indigestion/reflux, choking or coughing while eating, abdominal pain with or after eating No palpitations, orthopnea or DOE No Abdominal pain, N/V, melena,hematochezia, dark tarry stools, constipation No dysuria, malodorous urine, hematuria or flank pain No new skin rashes, lesions, masses or bruises, No new joint pains, aches, swelling or redness No recent unintentional weight gain or loss No polyuria, polydypsia or polyphagia   Past  Medical History:  Diagnosis Date  . Hypertension   . Substance abuse     Past Surgical History:    Procedure Laterality Date  . APPENDECTOMY    . THORACOTOMY Right 08/29/2016   Procedure: THORACOTOMY MAJOR;  Surgeon: Kerin Perna, MD;  Location: [facility] OR;  Service: Thoracic;  Laterality: Right;  Marland Kitchen VIDEO ASSISTED THORACOSCOPY (VATS)/EMPYEMA Right 08/29/2016   Procedure: VIDEO ASSISTED THORACOSCOPY (VATS)/ DRAINAGE EMPYEMA;  Surgeon: Kerin Perna, MD;  Location: [facility] OR;  Service: Thoracic;  Laterality: Right;    Social History   Social History  . Marital status: Single    Spouse name: N/A  . Number of children: N/A  . Years of education: N/A   Occupational History  . remodels apartments    Social History Main Topics  . Smoking status: Current Every Day Smoker    Packs/day: 1.00    Years: 12.00    Types: Cigarettes  . Smokeless tobacco: Never Used  . Alcohol use Yes     Comment: etoh abuse - reports unsure when he last used, "I just don't like it"  . Drug use: Yes    Types: Cocaine, Heroin, Benzodiazepines     Comment: 12/09/16 last cocaine use 2 days ago  . Sexual activity: Yes    Birth control/ protection: Condom   Other Topics Concern  . Not on file   Social History Narrative  . No narrative on file    Mobility: Without assistive devices Work history: Unemployed   No Known Allergies  Family History  Problem Relation Age of Onset  . Diabetes Mother   . Hypertension Mother   . CAD Father 21     Prior to Admission medications   Medication Sig Start Date End Date Taking? Authorizing Provider  ALPRAZolam (XANAX) 0.25 MG tablet Take 1 tablet (0.25 mg total) by mouth 2 (two) times daily. 09/07/16   Sharlene Dory, PA-C  ALPRAZolam (XANAX) 0.25 MG tablet Take 1 tablet (0.25 mg total) by mouth 2 (two) times daily. 11/25/16   Barrett Henle, PA-C  gabapentin (NEURONTIN) 300 MG capsule Take 1 capsule (300 mg total) by mouth 3 (three) times daily. For withdrawal related anxiety symptoms 11/12/16   Kerin Perna, MD  gabapentin (NEURONTIN) 300 MG capsule  Take 1 capsule (300 mg total) by mouth 3 (three) times daily. 11/25/16   Barrett Henle, PA-C  linezolid (ZYVOX) 600 MG tablet Take 1 tablet (600 mg total) by mouth 2 (two) times daily. Patient not taking: Reported on 11/25/2016 09/07/16   Sharlene Dory, PA-C  metoprolol (LOPRESSOR) 25 MG tablet Take 0.5 tablets (12.5 mg total) by mouth 2 (two) times daily. 11/25/16   Barrett Henle, PA-C  metoprolol tartrate (LOPRESSOR) 25 MG tablet Take 0.5 tablets (12.5 mg total) by mouth 2 (two) times daily. 09/06/16   Sharlene Dory, PA-C  Oxycodone HCl 10 MG TABS Take 1 tablet (10 mg total) by mouth every 6 (six) hours as needed. Patient taking differently: Take 10 mg by mouth every 6 (six) hours as needed (pain).  09/07/16   Sharlene Dory, PA-C    Physical Exam: Vitals:   12/09/16 0320 12/09/16 0340 12/09/16 0606 12/09/16 0700  BP: 166/80 162/81 127/78 144/80  Pulse: 103 106 95 87  Resp: 21 22 21 21   Temp:      TempSrc:      SpO2: (!) 89% 90% 94% 93%  Weight:      Height:  Constitutional: NAD, calm, comfortable-very sleepy but nontoxic appearing Eyes: PERRL, lids and conjunctivae normal ENMT: Mucous membranes are moist. Posterior pharynx clear of any exudate or lesions.Normal dentition.  Neck: normal, supple, no masses, no thyromegaly Respiratory: clear to auscultation bilaterally, no lower airway wheezing although when sleeping noted with upper airway/pharyngeal wheezing that resolves when awake and speaking, no crackles. Diminished in bases bilaterally Normal respiratory effort. No accessory muscle use. 2 L oxygen Cardiovascular: Regular rate and rhythm, no murmurs / rubs / gallops. No extremity edema. 2+ pedal pulses. No carotid bruits.  Abdomen: no tenderness, no masses palpated. No hepatosplenomegaly. Bowel sounds positive.  Musculoskeletal: no clubbing / cyanosis. No joint deformity upper and lower extremities. Good ROM, no contractures. Normal muscle tone. Mild right  anterior chest wall tenderness reproducible with palpation Skin: no rashes, lesions, ulcers. No induration Neurologic: CN 2-12 grossly intact. Sensation intact, DTR normal. Strength 5/5 x all 4 extremities.  Psychiatric: Normal judgment and insight. Sleeping but easily awakened and oriented x 3. Normal mood. Patient reports has not slept in 2 days.   Labs on Admission: I have personally reviewed following labs and imaging studies  CBC:  Recent Labs Lab 12/09/16 0113  WBC 13.1*  HGB 13.0  HCT 39.2  MCV 88.9  PLT 268   Basic Metabolic Panel:  Recent Labs Lab 12/09/16 0113  NA 136  K 3.8  CL 100*  CO2 23  GLUCOSE 116*  BUN 7  CREATININE 0.89  CALCIUM 8.8*   GFR: Estimated Creatinine Clearance: 158.7 mL/min (by C-G formula based on SCr of 0.89 mg/dL). Liver Function Tests: No results for input(s): AST, ALT, ALKPHOS, BILITOT, PROT, ALBUMIN in the last 168 hours. No results for input(s): LIPASE, AMYLASE in the last 168 hours. No results for input(s): AMMONIA in the last 168 hours. Coagulation Profile: No results for input(s): INR, PROTIME in the last 168 hours. Cardiac Enzymes:  Recent Labs Lab 12/09/16 0113  TROPONINI <0.03   BNP (last 3 results) No results for input(s): PROBNP in the last 8760 hours. HbA1C: No results for input(s): HGBA1C in the last 72 hours. CBG: No results for input(s): GLUCAP in the last 168 hours. Lipid Profile: No results for input(s): CHOL, HDL, LDLCALC, TRIG, CHOLHDL, LDLDIRECT in the last 72 hours. Thyroid Function Tests: No results for input(s): TSH, T4TOTAL, FREET4, T3FREE, THYROIDAB in the last 72 hours. Anemia Panel: No results for input(s): VITAMINB12, FOLATE, FERRITIN, TIBC, IRON, RETICCTPCT in the last 72 hours. Urine analysis:    Component Value Date/Time   COLORURINE AMBER (A) 08/29/2016 0338   APPEARANCEUR CLOUDY (A) 08/29/2016 0338   LABSPEC 1.028 08/29/2016 0338   PHURINE 5.5 08/29/2016 0338   GLUCOSEU NEGATIVE  08/29/2016 0338   HGBUR NEGATIVE 08/29/2016 0338   BILIRUBINUR NEGATIVE 08/29/2016 0338   KETONESUR NEGATIVE 08/29/2016 0338   PROTEINUR NEGATIVE 08/29/2016 0338   NITRITE NEGATIVE 08/29/2016 0338   LEUKOCYTESUR NEGATIVE 08/29/2016 0338   Sepsis Labs: @LABRCNTIP (procalcitonin:4,lacticidven:4) )No results found for this or any previous visit (from the past 240 hour(s)).   Radiological Exams on Admission: Dg Chest 2 View  Result Date: 12/09/2016 CLINICAL DATA:  RIGHT rib pain. Diaphoresis. History of VATS and empyema drainage/thoracotomy October 2017. EXAM: CHEST  2 VIEW COMPARISON:  Chest radiograph September 19, 2016 and CT chest September 03, 2016 FINDINGS: Dense consolidation RIGHT lung base with likely loculated moderate pleural effusion. Elevated RIGHT hemidiaphragm with juxta peaking consistent with sub pleural effusion. Bandlike densities RIGHT lung base. LEFT lung is clear. Cardiomediastinal  silhouette is normal. No pneumothorax. Soft tissue planes and included osseous structures are nonsuspicious. IMPRESSION: Increasing RIGHT effusion and consolidation concerning for recurrent empyema. RIGHT lung base atelectasis. Electronically Signed   By: Awilda Metroourtnay  Bloomer M.D.   On: 12/09/2016 02:01   Ct Chest W Contrast  Result Date: 12/09/2016 CLINICAL DATA:  Empyema.  History of substance abuse, thoracotomy. EXAM: CT CHEST WITH CONTRAST TECHNIQUE: Multidetector CT imaging of the chest was performed during intravenous contrast administration. CONTRAST:  75mL ISOVUE-300 IOPAMIDOL (ISOVUE-300) INJECTION 61% COMPARISON:  Chest radiograph December 09, 2016 at 0125 hours and CT chest September 03, 2016 FINDINGS: CARDIOVASCULAR: Heart and pericardium are unremarkable. Thoracic aorta is normal course and caliber, unremarkable. MEDIASTINUM/NODES: No mediastinal mass. Greater than expected number of prominent lymph nodes, largest lymph node measures up to 10 mm. 11 mm RIGHT supraclavicular fossa lymph node.  LUNGS/PLEURA: Elevated RIGHT hemidiaphragm. RIGHT lateral pleural space rim enhancing fluid collection with chest wall invasion through the fourth through seventh intercostal spaces into the RIGHT chest wall muscles, collection measures 4.4 x 7.2 x 7.2 cm. Small RIGHT pleural effusion and pleural thickening. Patchy ground-glass opacities bilateral lung involving all lobes. RIGHT upper, RIGHT middle lobe and to lesser extent RIGHT lower lobe scarring. Bronchial wall thickening. No pneumothorax. UPPER ABDOMEN: Nonacute. MUSCULOSKELETAL: Healing nondisplaced RIGHT anterior fifth rib fracture, abscess surrounds. IMPRESSION: RIGHT chest wall 4.4 x 7.2 x 7.2 cm abscess extending from pleural space to the RIGHT chest wall muscles. Bronchial wall thickening and scattered ground-glass opacities concerning for pneumonia. Borderline medial spinal lymphadenopathy is likely reactive. Electronically Signed   By: Awilda Metroourtnay  Bloomer M.D.   On: 12/09/2016 06:02    EKG: (Independently reviewed) sinus tachycardia with ventricular rate 130 bpm, QTC 444 ms, subtle elevation and ST segments V2 through V6 likely rate related no obvious definitive ischemic changes  Assessment/Plan Principal Problem:   Acute respiratory failure with hypoxia  -Patient presents with reports of right sided chest discomfort and was found to be hypoxic, tachycardic and with CT evidence of chest wall abscess and associated pneumonitis -Continue supportive care with oxygen -Treat underlying causes -Flutter valve -Encourage early mobilization  Active Problems:   Community acquired pneumonia/pneumonitis -Unclear if patient has bacterial infectious pneumonia vs the changes seen on CT are more reflective of pneumonitis from recent inhaled methamphetamine use -Empiric antibiotics (Zosyn and vancomycin-see below regarding recent pulmonary history) -Follow up on blood cultures -Obtain sputum culture -Urinary strep antigen    Empyema right lung    -Previous history of same in October 2017 -Pleural cultures at that time consistent with MRSA-patient discharged on Zyvox and followed by ID while IP. He completed therapy as prescribed 4 weeks -Presents with chest wall abscess that appears to arise out of the pleura -Empiric Zosyn and vancomycin as above -Await CVTS as consultation -NPO until clarified if will require surgical intervention today -Scheduled Toradol IV for chest wall pain-scheduled Protonix/Pepcid while taking NSAIDs    Essential hypertension -Continue home Lopressor    SIRS (systemic inflammatory response syndrome)  -Patient presented with hypoxemia, tachycardia, white count greater than 12,000 with normal lactate and no evidence of end organ dysfunction -SIRS physiology has rapidly improved with administration of oxygen, antibiotics and after fluid challenge -Continue to monitor for evolution into sepsis physiology; currently hemodynamically stable -Treat causes as noted above    Polysubstance dependence including opioid type drug, episodic abuse  -Unfortunately has not been successful in remaining "clean" since discharge -Admitted to triage nurse has utilized cocaine and methamphetamine in  the past 3 days-denies alcohol or heroin use recently -UDS -Avoid utilization of narcotics to treat pain if possible in the preoperative setting-likely require short-term contract is undergo surgical procedure    PTSD (post-traumatic stress disorder)/Opioid-induced mood disorder  -Patient appears to have a degree of chronic pain -Continue new preadmission Xanax and Neurontin       DVT prophylaxis: Lovenox-first dose ordered at 8 PM since uncertain when surgical procedure may occur Code Status: Full Family Communication: No family at bedside Disposition Plan: Anticipate discharge back to preadmission home environment when medically stable Consults called: Cardiothoracic surgery/Van Karen Kitchens ANP-BC [facility] Pager [phone]   If 7PM-7AM, please contact night-coverage [email or web address] Password [facility]  12/09/2016, 7:47 AM

## 2016-12-09 NOTE — ED Notes (Signed)
Pt put on 2L oxygen

## 2016-12-09 NOTE — ED Notes (Signed)
Patient transported to CT 

## 2016-12-09 NOTE — Progress Notes (Signed)
Pharmacy Antibiotic Note  Bryan Jennings is a 26 y.o. male with h/o VATS 08/2016 admitted on 12/09/2016 with chest pain/dyspnea, found to have chest wall abcess.  Pharmacy has been consulted for Vancomycin and Zosyn dosing.  Vancomycin 2 g IV given in ED at 0500   Plan: Vancomycin 1250 mgI IV q8h Zosyn 3.375 g IV q8h   Height: 5\' 10"  (177.8 cm) Weight: 250 lb (113.4 kg) IBW/kg (Calculated) : 73  Temp (24hrs), Avg:100.5 F (38.1 C), Min:100.5 F (38.1 C), Max:100.5 F (38.1 C)   Recent Labs Lab 12/09/16 0113 12/09/16 0646  WBC 13.1*  --   CREATININE 0.89  --   LATICACIDVEN  --  0.79    Estimated Creatinine Clearance: 158.7 mL/min (by C-G formula based on SCr of 0.89 mg/dL).    No Known Allergies  Bryan Jennings, Bryan Jennings 12/09/2016 6:55 AM

## 2016-12-09 NOTE — Consult Note (Signed)
[address]       [place] [address]             [phone]        Bryan Jennings [facility] Medical Record #[id number] Date of Birth: 06-Dec-1990  Referring: No ref. provider found Primary Care: No PCP Per Patient  Chief Complaint:    Chief Complaint  Patient presents with  . Chest Pain  Patient examined, CT scan of chest personally reviewed and counseled with patient  History of Present Illness:     26 year old Caucasian obese male with history of IV drug abuse who had a right VATS for MRSA empyema in October. At that time he also had a right chest wall abscess that was also drained. The patient did not have extended IV antibiotic coverage for severe MRSA infection but was transitioned to oral Zyvox at the time of discharge. He states he has not used IV drugs. He presents today with right anterior lateral chest wall tenderness low-grade fever and white count. Chest CT scan shows a recurrent right chest wall abscess extending into the pleural space but separated by a fibrous capsule. There is no evidence of recurrent pleural space infection in the right lower chest at the site of the empyema. The patient is a nondiabetic. Vital signs are stable.  Current Activity/ Functional Status: Patient lives alone and states he has been working.   Zubrod Score: At the time of surgery this patient's most appropriate activity status/level should be described as: []     0    Normal activity, no symptoms []     1    Restricted in physical strenuous activity but ambulatory, able to do out light work [x]     2    Ambulatory and capable of self care, unable to do work activities, up and about                 more than 50%  Of the time                            []     3    Only limited self care, in bed greater than 50% of waking hours []     4    Completely disabled, no self care, confined to bed or chair []     5    Moribund  Past Medical History:  Diagnosis Date  .  Hypertension   . Substance abuse     Past Surgical History:  Procedure Laterality Date  . APPENDECTOMY    . THORACOTOMY Right 08/29/2016   Procedure: THORACOTOMY MAJOR;  Surgeon: Kerin Perna, MD;  Location: [facility] OR;  Service: Thoracic;  Laterality: Right;  Marland Kitchen VIDEO ASSISTED THORACOSCOPY (VATS)/EMPYEMA Right 08/29/2016   Procedure: VIDEO ASSISTED THORACOSCOPY (VATS)/ DRAINAGE EMPYEMA;  Surgeon: Kerin Perna, MD;  Location: [facility] OR;  Service: Thoracic;  Laterality: Right;    History  Smoking Status  . Current Every Day Smoker  . Packs/day: 1.00  . Years: 12.00  . Types: Cigarettes  Smokeless Tobacco  . Never Used    History  Alcohol Use  . Yes    Comment: etoh abuse - reports unsure when he last used, "I just don't like it"    Social History   Social History  . Marital status: Single    Spouse name: N/A  . Number of children: N/A  . Years of education: N/A  Occupational History  . remodels apartments    Social History Main Topics  . Smoking status: Current Every Day Smoker    Packs/day: 1.00    Years: 12.00    Types: Cigarettes  . Smokeless tobacco: Never Used  . Alcohol use Yes     Comment: etoh abuse - reports unsure when he last used, "I just don't like it"  . Drug use: Yes    Types: Cocaine, Heroin, Benzodiazepines     Comment: 12/09/16 last cocaine use 2 days ago  . Sexual activity: Yes    Birth control/ protection: Condom   Other Topics Concern  . Not on file   Social History Narrative  . No narrative on file    No Known Allergies  Current Facility-Administered Medications  Medication Dose Route Frequency Provider Last Rate Last Dose  . 0.9 %  sodium chloride infusion   Intravenous Continuous Russella Dar, NP      . acetaminophen (TYLENOL) tablet 650 mg  650 mg Oral Q6H PRN Russella Dar, NP       Or  . acetaminophen (TYLENOL) suppository 650 mg  650 mg Rectal Q6H PRN Russella Dar, NP      . ALPRAZolam Prudy Feeler) tablet 0.25 mg  0.25 mg  Oral BID Russella Dar, NP      . enoxaparin (LOVENOX) injection 40 mg  40 mg Subcutaneous Q24H Russella Dar, NP      . famotidine (PEPCID) IVPB 20 mg premix  20 mg Intravenous Q12H Russella Dar, NP 100 mL/hr at 12/09/16 0758 20 mg at 12/09/16 0758  . gabapentin (NEURONTIN) capsule 300 mg  300 mg Oral TID Russella Dar, NP      . ketorolac (TORADOL) 30 MG/ML injection 30 mg  30 mg Intravenous Q6H Russella Dar, NP   30 mg at 12/09/16 0758  . metoprolol tartrate (LOPRESSOR) tablet 12.5 mg  12.5 mg Oral BID Russella Dar, NP      . ondansetron [facility]) tablet 4 mg  4 mg Oral Q6H PRN Russella Dar, NP       Or  . ondansetron [facility]) injection 4 mg  4 mg Intravenous Q6H PRN Russella Dar, NP      . pantoprazole (PROTONIX) injection 40 mg  40 mg Intravenous Q24H Russella Dar, NP   40 mg at 12/09/16 0758  . piperacillin-tazobactam (ZOSYN) IVPB 3.375 g  3.375 g Intravenous Q8H Rondell A Smith, MD      . sodium chloride flush (NS) 0.9 % injection 3 mL  3 mL Intravenous Q12H Russella Dar, NP      . vancomycin (VANCOCIN) 1,250 mg in sodium chloride 0.9 % 250 mL IVPB  1,250 mg Intravenous Q8H Rondell Burtis Junes, MD        Prescriptions Prior to Admission  Medication Sig Dispense Refill Last Dose  . ALPRAZolam (XANAX) 0.25 MG tablet Take 1 tablet (0.25 mg total) by mouth 2 (two) times daily. 10 tablet 0 Past Week at Unknown time  . gabapentin (NEURONTIN) 300 MG capsule Take 1 capsule (300 mg total) by mouth 3 (three) times daily. For withdrawal related anxiety symptoms 90 capsule 1 Past Week at Unknown time  . metoprolol tartrate (LOPRESSOR) 25 MG tablet Take 0.5 tablets (12.5 mg total) by mouth 2 (two) times daily. 30 tablet 1 Past Week at Unknown time  . Oxycodone HCl 10 MG TABS Take 1 tablet (10 mg total) by mouth every 6 (six)  hours as needed. (Patient taking differently: Take 10 mg by mouth every 6 (six) hours as needed (pain). ) 30 tablet 0 unk    Family History  Problem  Relation Age of Onset  . Diabetes Mother   . Hypertension Mother   . CAD Father [id number]     Review of Systems:       Cardiac Review of Systems: Y or N  Chest Pain [  Yes  ]  Resting SOB [  no ] Exertional SOB  [ no ]  Orthopnea [no  ]   Pedal Edema [no   ]    Palpitations [ no ] Syncope  [  no]   Presyncope [   no]  General Review of Systems: [Y] = yes [  ]=no Constitional: recent weight change [  ]; anorexia [  ]; fatigue Mahler.Beck[yes  ]; nausea [  ]; night sweats [  ]; fever Mahler.Beck[yes  ]; or chills [  ]                                                               Dental: poor dentition[  ]; Last Dentist visit:   Eye : blurred vision [  ]; diplopia [   ]; vision changes [  ];  Amaurosis fugax[  ]; Resp: cough [  ];  wheezing[  ];  hemoptysis[  ]; shortness of breath[  ]; paroxysmal nocturnal dyspnea[  ]; dyspnea on exertion[  ]; or orthopnea[  ]; chest wall pain right side GI:  gallstones[  ], vomiting[  ];  dysphagia[  ]; melena[  ];  hematochezia [  ]; heartburn[  ];   Hx of  Colonoscopy[  ]; GU: kidney stones [  ]; hematuria[  ];   dysuria [  ];  nocturia[  ];  history of     obstruction [  ]; urinary frequency [  ]             Skin: rash, swelling[  ];, hair loss[  ];  peripheral edema[  ];  or itching[  ]; Musculosketetal: myalgias[ yes ];  joint swelling[  ];  joint erythema[  ];  joint pain[  ];  back pain[  ];  Heme/Lymph: bruising[  ];  bleeding[  ];  anemia[  ];  Neuro: TIA[  ];  headaches[  ];  stroke[  ];  vertigo[  ];  seizures[  ];   paresthesias[  ];  difficulty walking[  ];  Psych:depression[  ]; anxiety[  ];  Endocrine: diabetes[  ];  thyroid dysfunction[  ];  Immunizations: Flu [  ]; Pneumococcal[  ];  Other: No hemoptysis  Physical Exam: BP 119/80 (BP Location: Left Arm)   Pulse 92   Temp 97.7 F (36.5 C) (Oral)   Resp 19   Ht 5\' 10"  (1.778 m)   Wt 252 lb 1.6 oz (114.4 kg)   SpO2 97%   BMI 36.17 kg/m       Physical Exam  General: Short obese Caucasian male no acute  distress HEENT: Normocephalic pupils equal , dentition adequate Neck: Supple without JVD, adenopathy, or bruit Chest: Clear to auscultation, symmetrical breath sounds, no rhonchi, significant tenderness over her anterolateral right chest wall but without erythema or fluctuance palpable Old thoracotomy  incision well-healed              Cardiovascular: Regular rate and rhythm, no murmur, no gallop, peripheral pulses             palpable in all extremities Abdomen:  Soft, nontender, no palpable mass or organomegaly Extremities: Warm, well-perfused, no clubbing cyanosis edema or tenderness,              no venous stasis changes of the legs Rectal/GU: Deferred Neuro: Grossly non--focal and symmetrical throughout Skin: Clean and dry without rash or ulceration   Diagnostic Studies & Laboratory data:     Recent Radiology Findings:   Dg Chest 2 View  Result Date: 12/09/2016 CLINICAL DATA:  RIGHT rib pain. Diaphoresis. History of VATS and empyema drainage/thoracotomy October 2017. EXAM: CHEST  2 VIEW COMPARISON:  Chest radiograph September 19, 2016 and CT chest September 03, 2016 FINDINGS: Dense consolidation RIGHT lung base with likely loculated moderate pleural effusion. Elevated RIGHT hemidiaphragm with juxta peaking consistent with sub pleural effusion. Bandlike densities RIGHT lung base. LEFT lung is clear. Cardiomediastinal silhouette is normal. No pneumothorax. Soft tissue planes and included osseous structures are nonsuspicious. IMPRESSION: Increasing RIGHT effusion and consolidation concerning for recurrent empyema. RIGHT lung base atelectasis. Electronically Signed   By: Awilda Metro M.D.   On: 12/09/2016 02:01   Ct Chest W Contrast  Result Date: 12/09/2016 CLINICAL DATA:  Empyema.  History of substance abuse, thoracotomy. EXAM: CT CHEST WITH CONTRAST TECHNIQUE: Multidetector CT imaging of the chest was performed during intravenous contrast administration. CONTRAST:  75mL ISOVUE-300  IOPAMIDOL (ISOVUE-300) INJECTION 61% COMPARISON:  Chest radiograph December 09, 2016 at 0125 hours and CT chest September 03, 2016 FINDINGS: CARDIOVASCULAR: Heart and pericardium are unremarkable. Thoracic aorta is normal course and caliber, unremarkable. MEDIASTINUM/NODES: No mediastinal mass. Greater than expected number of prominent lymph nodes, largest lymph node measures up to 10 mm. 11 mm RIGHT supraclavicular fossa lymph node. LUNGS/PLEURA: Elevated RIGHT hemidiaphragm. RIGHT lateral pleural space rim enhancing fluid collection with chest wall invasion through the fourth through seventh intercostal spaces into the RIGHT chest wall muscles, collection measures 4.4 x 7.2 x 7.2 cm. Small RIGHT pleural effusion and pleural thickening. Patchy ground-glass opacities bilateral lung involving all lobes. RIGHT upper, RIGHT middle lobe and to lesser extent RIGHT lower lobe scarring. Bronchial wall thickening. No pneumothorax. UPPER ABDOMEN: Nonacute. MUSCULOSKELETAL: Healing nondisplaced RIGHT anterior fifth rib fracture, abscess surrounds. IMPRESSION: RIGHT chest wall 4.4 x 7.2 x 7.2 cm abscess extending from pleural space to the RIGHT chest wall muscles. Bronchial wall thickening and scattered ground-glass opacities concerning for pneumonia. Borderline medial spinal lymphadenopathy is likely reactive. Electronically Signed   By: Awilda Metro M.D.   On: 12/09/2016 06:02     I have independently reviewed the above radiologic studies.  Recent Lab Findings: Lab Results  Component Value Date   WBC 13.1 (H) 12/09/2016   HGB 13.0 12/09/2016   HCT 39.2 12/09/2016   PLT 268 12/09/2016   GLUCOSE 116 (H) 12/09/2016   ALT 33 08/31/2016   AST 44 (H) 08/31/2016   NA 136 12/09/2016   K 3.8 12/09/2016   CL 100 (L) 12/09/2016   CREATININE 0.89 12/09/2016   BUN 7 12/09/2016   CO2 23 12/09/2016   INR 1.08 08/28/2016   HGBA1C 5.8 (H) 08/29/2016      Assessment / Plan:     Recurrent chest wall and adjacent  pleural space loculated abscess from probable recurrent MRSA. The patient was not  given long-term IV vancomycin for a significant MRSA empyema and chest wall abscess.  The collection is well circumscribed and should be effectively drained with a CT-guided percutaneous tube. If this does not provide adequate drainage then repeat surgical drainage will be necessary.  We'll place order to IR to assess for CT-guided percutaneous drainage tomorrow   @ME1 @ 12/09/2016 9:19 AM

## 2016-12-09 NOTE — Progress Notes (Signed)
Mr. Bryan Jennings is a 26 year old male with pmh HTN, IVDA, and R empyema s/p thoracotomy in 08/2016; who presents with chest pain. VS: Temp 100.19F, pulse up to 131, respirations up to 22, O2 sats as low as 89%, BPs maintained. Labs: WBC 13.1, lactic acid  0.79. Chest x-ray shows right chest wall abscess. Cardiothoracic surgery Dr. Donata ClayVan Trigt consulted and will see in a.m. patient placed on empiric antibiotics of vancomycin and Zosyn. TRH to admit to telemetry bed

## 2016-12-09 NOTE — ED Triage Notes (Addendum)
Pt endorses right sided rib pain. Pt diaphoretic and tachycardic in the 130s in triage o2 sat 89% on RA. Pt had surgery in November for "air pocket in lung and I got pneumonia" Pt alert and oriented x 4. Pt also has been using cocaine and meth, last use x 3 days ago.

## 2016-12-10 ENCOUNTER — Inpatient Hospital Stay (HOSPITAL_COMMUNITY): Payer: Self-pay

## 2016-12-10 LAB — CBC
HCT: 36.9 % — ABNORMAL LOW (ref 39.0–52.0)
Hemoglobin: 11.7 g/dL — ABNORMAL LOW (ref 13.0–17.0)
MCH: 28.5 pg (ref 26.0–34.0)
MCHC: 31.7 g/dL (ref 30.0–36.0)
MCV: 89.8 fL (ref 78.0–100.0)
PLATELETS: 237 10*3/uL (ref 150–400)
RBC: 4.11 MIL/uL — ABNORMAL LOW (ref 4.22–5.81)
RDW: 14 % (ref 11.5–15.5)
WBC: 6.1 10*3/uL (ref 4.0–10.5)

## 2016-12-10 LAB — COMPREHENSIVE METABOLIC PANEL
ALK PHOS: 39 U/L (ref 38–126)
ALT: 45 U/L (ref 17–63)
AST: 34 U/L (ref 15–41)
Albumin: 2.5 g/dL — ABNORMAL LOW (ref 3.5–5.0)
Anion gap: 7 (ref 5–15)
BUN: 14 mg/dL (ref 6–20)
CALCIUM: 8.1 mg/dL — AB (ref 8.9–10.3)
CO2: 26 mmol/L (ref 22–32)
CREATININE: 1.07 mg/dL (ref 0.61–1.24)
Chloride: 106 mmol/L (ref 101–111)
GFR calc Af Amer: >60 mL/min (ref >60)
Glucose, Bld: 123 mg/dL — ABNORMAL HIGH (ref 65–99)
Potassium: 3.6 mmol/L (ref 3.5–5.1)
SODIUM: 139 mmol/L (ref 135–145)
Total Bilirubin: 0.6 mg/dL (ref 0.3–1.2)
Total Protein: 5.9 g/dL — ABNORMAL LOW (ref 6.5–8.1)

## 2016-12-10 LAB — MRSA PCR SCREENING: MRSA by PCR: NEGATIVE

## 2016-12-10 LAB — VANCOMYCIN, TROUGH: Vancomycin Tr: 21 ug/mL (ref 15–20)

## 2016-12-10 LAB — CG4 I-STAT (LACTIC ACID): Lactic Acid, Venous: 0.73 mmol/L (ref 0.5–1.9)

## 2016-12-10 LAB — PROTIME-INR
INR: 1.06
Prothrombin Time: 13.8 seconds (ref 11.4–15.2)

## 2016-12-10 LAB — HIV ANTIBODY (ROUTINE TESTING W REFLEX): HIV SCREEN 4TH GENERATION: NONREACTIVE

## 2016-12-10 MED ORDER — MIDAZOLAM HCL 2 MG/2ML IJ SOLN
INTRAMUSCULAR | Status: AC | PRN
  Administered 2016-12-10 (×2): 1 mg via INTRAVENOUS

## 2016-12-10 MED ORDER — SODIUM CHLORIDE 0.9 % IV SOLN
1500.0000 mg | Freq: Two times a day (BID) | INTRAVENOUS | Status: DC
Start: 2016-12-10 — End: 2016-12-12
  Administered 2016-12-10 – 2016-12-12 (×4): 1500 mg via INTRAVENOUS
  Filled 2016-12-10 (×5): qty 1500

## 2016-12-10 MED ORDER — ENOXAPARIN SODIUM 40 MG/0.4ML ~~LOC~~ SOLN
40.0000 mg | SUBCUTANEOUS | Status: DC
  Administered 2016-12-11 – 2016-12-15 (×5): 40 mg via SUBCUTANEOUS
  Filled 2016-12-10 (×5): qty 0.4

## 2016-12-10 MED ORDER — LIDOCAINE HCL 1 % IJ SOLN
INTRAMUSCULAR | Status: AC
  Filled 2016-12-10: qty 20

## 2016-12-10 MED ORDER — VANCOMYCIN HCL 10 G IV SOLR: 1500.0000 mg | Freq: Two times a day (BID) | INTRAVENOUS | Status: DC

## 2016-12-10 MED ORDER — MIDAZOLAM HCL 2 MG/2ML IJ SOLN
INTRAMUSCULAR | Status: AC
  Filled 2016-12-10: qty 6

## 2016-12-10 MED ORDER — FENTANYL CITRATE (PF) 100 MCG/2ML IJ SOLN
INTRAMUSCULAR | Status: AC
  Filled 2016-12-10: qty 4

## 2016-12-10 MED ORDER — NICOTINE 14 MG/24HR TD PT24
14.0000 mg | MEDICATED_PATCH | Freq: Every day | TRANSDERMAL | Status: DC
  Administered 2016-12-11: 14 mg via TRANSDERMAL
  Filled 2016-12-10 (×5): qty 1

## 2016-12-10 MED ORDER — FENTANYL CITRATE (PF) 100 MCG/2ML IJ SOLN
INTRAMUSCULAR | Status: AC | PRN
  Administered 2016-12-10 (×3): 50 ug via INTRAVENOUS

## 2016-12-10 NOTE — Sedation Documentation (Signed)
O2 d/c'd 

## 2016-12-10 NOTE — Sedation Documentation (Signed)
Patient is resting comfortably. 

## 2016-12-10 NOTE — Procedures (Signed)
Interventional Radiology Procedure Note  Procedure: CT guided right chest wall abscess drain.  91F drain placed.   Findings:  ~5cc aspirated.  Mostly phlegmon material.  Sample to lab. Gravity drain.   Complications: None Recommendations:  - Daily output recording.  - Follow up culture. - TID flush with sterile saline. - Do not submerge   - Routine wound care   Signed,  Yvone NeuJaime S. Loreta AveWagner, DO

## 2016-12-10 NOTE — Progress Notes (Signed)
PROGRESS NOTE    Bryan Jennings  ZOX:[id number] DOB: Dec 08, 1990 DOA: 12/09/2016 PCP: No PCP Per Patient  Brief Narrative: Bryan Jennings is a 26 y.o. male with a Past Medical History IV drug use, MRSA empyema s/p VATS in 10/17 and chest wall abscess which was drained, recent cocaine use, depression, hypertension, PTSD  who presented with shortness of breath. CT Chest shows chest wall abscess, fever and leukocytosis, CVTS and IR consulting, plan for IR drainage  Assessment & Plan:    Chest wall abscess -drained at time of admission in October -Chest CT scan shows a recurrent right chest wall abscess extending into the pleural space but separated by a fibrous capsule. No evidence of recurrent pleural space infection in the right lower chest at the site of the empyema per Dr.Van trigt -CVTS and IR consulting, plan for IR drainage today -on Empiric Vanc/Zosyn -FU cultures  Recent Empyema  -in October 2017, s/p VATS -Pleural cultures at that time consistent with MRSA-patient discharged on Zyvox and followed by ID while IP. He completed therapy as prescribed 4 weeks    Essential hypertension -Continue home Lopressor    Polysubstance dependence including opioid type drug, episodic abuse  -Admitted  Using cocaine and methamphetamine in the past 3 days-denies alcohol or heroin use recently -limit narcotics, apparently going to NA meetings for few months now    PTSD (post-traumatic stress disorder)/Opioid-induced mood disorder  -Continue new preadmission Xanax and Neurontin  DVT prophylaxis: hold lovenox for procedure today, resume tomorrow Code Status: Full Family Communication: No family at bedside Disposition Plan: Home in few days when above issues better  Consultants:   CVTS  IR   Antimicrobials Vanc/Zosyn 2/4->  Subjective: stil with pain at R chest wall site  Objective: Vitals:   12/09/16 0835 12/09/16 1223 12/09/16 2013 12/10/16 0559  BP: 119/80 (!) 116/58 110/84  (!) 90/43  Pulse: 92 85 87 (!) 55  Resp: 19 20 18 18   Temp: 97.7 F (36.5 C) 98.2 F (36.8 C) 99.1 F (37.3 C) 98 F (36.7 C)  TempSrc: Oral Oral Oral Oral  SpO2: 97% 97% 96% 94%  Weight: 114.4 kg (252 lb 1.6 oz)     Height:        Intake/Output Summary (Last 24 hours) at 12/10/16 1230 Last data filed at 12/10/16 0500  Gross per 24 hour  Intake           2727.5 ml  Output              450 ml  Net           2277.5 ml   Filed Weights   12/09/16 0107 12/09/16 0835  Weight: 113.4 kg (250 lb) 114.4 kg (252 lb 1.6 oz)    Examination:   General exam: Appears calm and comfortable, well built and nourished male, no distress Respiratory system: decreased BS on right, tenderness on R chest wall, anteriorly Cardiovascular system: S1 & S2 heard, RRR. Gastrointestinal system: Abdomen is nondistended, soft and nontender.  Normal bowel sounds heard. Central nervous system: Alert and oriented. No focal neurological deficits. Extremities: Symmetric 5 x 5 power. Skin: No rashes, lesions or ulcers Psychiatry: Judgement and insight appear normal. Mood & affect appropriate.     Data Reviewed: I have personally reviewed following labs and imaging studies  CBC:  Recent Labs Lab 12/09/16 0113 12/10/16 0325  WBC 13.1* 6.1  HGB 13.0 11.7*  HCT 39.2 36.9*  MCV 88.9 89.8  PLT 268 237  Basic Metabolic Panel:  Recent Labs Lab 12/09/16 0113 12/10/16 0325  NA 136 139  K 3.8 3.6  CL 100* 106  CO2 23 26  GLUCOSE 116* 123*  BUN 7 14  CREATININE 0.89 1.07  CALCIUM 8.8* 8.1*   GFR: Estimated Creatinine Clearance: 132.6 mL/min (by C-G formula based on SCr of 1.07 mg/dL). Liver Function Tests:  Recent Labs Lab 12/10/16 0325  AST 34  ALT 45  ALKPHOS 39  BILITOT 0.6  PROT 5.9*  ALBUMIN 2.5*   No results for input(s): LIPASE, AMYLASE in the last 168 hours. No results for input(s): AMMONIA in the last 168 hours. Coagulation Profile:  Recent Labs Lab 12/10/16 0325  INR  1.06   Cardiac Enzymes:  Recent Labs Lab 12/09/16 0113  TROPONINI <0.03   BNP (last 3 results) No results for input(s): PROBNP in the last 8760 hours. HbA1C: No results for input(s): HGBA1C in the last 72 hours. CBG: No results for input(s): GLUCAP in the last 168 hours. Lipid Profile: No results for input(s): CHOL, HDL, LDLCALC, TRIG, CHOLHDL, LDLDIRECT in the last 72 hours. Thyroid Function Tests: No results for input(s): TSH, T4TOTAL, FREET4, T3FREE, THYROIDAB in the last 72 hours. Anemia Panel: No results for input(s): VITAMINB12, FOLATE, FERRITIN, TIBC, IRON, RETICCTPCT in the last 72 hours. Urine analysis:    Component Value Date/Time   COLORURINE AMBER (A) 08/29/2016 0338   APPEARANCEUR CLOUDY (A) 08/29/2016 0338   LABSPEC 1.028 08/29/2016 0338   PHURINE 5.5 08/29/2016 0338   GLUCOSEU NEGATIVE 08/29/2016 0338   HGBUR NEGATIVE 08/29/2016 0338   BILIRUBINUR NEGATIVE 08/29/2016 0338   KETONESUR NEGATIVE 08/29/2016 0338   PROTEINUR NEGATIVE 08/29/2016 0338   NITRITE NEGATIVE 08/29/2016 0338   LEUKOCYTESUR NEGATIVE 08/29/2016 0338   Sepsis Labs: @LABRCNTIP (procalcitonin:4,lacticidven:4)  ) Recent Results (from the past 240 hour(s))  MRSA PCR Screening     Status: None   Collection Time: 12/10/16  8:03 AM  Result Value Ref Range Status   MRSA by PCR NEGATIVE NEGATIVE Final    Comment:        The GeneXpert MRSA Assay (FDA approved for NASAL specimens only), is one component of a comprehensive MRSA colonization surveillance program. It is not intended to diagnose MRSA infection nor to guide or monitor treatment for MRSA infections.          Radiology Studies: Dg Chest 2 View  Result Date: 12/09/2016 CLINICAL DATA:  RIGHT rib pain. Diaphoresis. History of VATS and empyema drainage/thoracotomy October 2017. EXAM: CHEST  2 VIEW COMPARISON:  Chest radiograph September 19, 2016 and CT chest September 03, 2016 FINDINGS: Dense consolidation RIGHT lung base with  likely loculated moderate pleural effusion. Elevated RIGHT hemidiaphragm with juxta peaking consistent with sub pleural effusion. Bandlike densities RIGHT lung base. LEFT lung is clear. Cardiomediastinal silhouette is normal. No pneumothorax. Soft tissue planes and included osseous structures are nonsuspicious. IMPRESSION: Increasing RIGHT effusion and consolidation concerning for recurrent empyema. RIGHT lung base atelectasis. Electronically Signed   By: Awilda Metro M.D.   On: 12/09/2016 02:01   Ct Chest W Contrast  Result Date: 12/09/2016 CLINICAL DATA:  Empyema.  History of substance abuse, thoracotomy. EXAM: CT CHEST WITH CONTRAST TECHNIQUE: Multidetector CT imaging of the chest was performed during intravenous contrast administration. CONTRAST:  75mL ISOVUE-300 IOPAMIDOL (ISOVUE-300) INJECTION 61% COMPARISON:  Chest radiograph December 09, 2016 at 0125 hours and CT chest September 03, 2016 FINDINGS: CARDIOVASCULAR: Heart and pericardium are unremarkable. Thoracic aorta is normal course and caliber, unremarkable. MEDIASTINUM/NODES:  No mediastinal mass. Greater than expected number of prominent lymph nodes, largest lymph node measures up to 10 mm. 11 mm RIGHT supraclavicular fossa lymph node. LUNGS/PLEURA: Elevated RIGHT hemidiaphragm. RIGHT lateral pleural space rim enhancing fluid collection with chest wall invasion through the fourth through seventh intercostal spaces into the RIGHT chest wall muscles, collection measures 4.4 x 7.2 x 7.2 cm. Small RIGHT pleural effusion and pleural thickening. Patchy ground-glass opacities bilateral lung involving all lobes. RIGHT upper, RIGHT middle lobe and to lesser extent RIGHT lower lobe scarring. Bronchial wall thickening. No pneumothorax. UPPER ABDOMEN: Nonacute. MUSCULOSKELETAL: Healing nondisplaced RIGHT anterior fifth rib fracture, abscess surrounds. IMPRESSION: RIGHT chest wall 4.4 x 7.2 x 7.2 cm abscess extending from pleural space to the RIGHT chest wall  muscles. Bronchial wall thickening and scattered ground-glass opacities concerning for pneumonia. Borderline medial spinal lymphadenopathy is likely reactive. Electronically Signed   By: Awilda Metro M.D.   On: 12/09/2016 06:02        Scheduled Meds: . ALPRAZolam  0.25 mg Oral BID  . enoxaparin (LOVENOX) injection  40 mg Subcutaneous Q24H  . famotidine (PEPCID) IV  20 mg Intravenous Q12H  . gabapentin  300 mg Oral TID  . Influenza vac split quadrivalent PF  0.5 mL Intramuscular Tomorrow-1000  . ketorolac  30 mg Intravenous Q6H  . metoprolol tartrate  12.5 mg Oral BID  . pantoprazole (PROTONIX) IV  40 mg Intravenous Q24H  . piperacillin-tazobactam (ZOSYN)  IV  3.375 g Intravenous Q8H  . pneumococcal 23 valent vaccine  0.5 mL Intramuscular Tomorrow-1000  . sodium chloride flush  3 mL Intravenous Q12H  . vancomycin  1,250 mg Intravenous Q8H   Continuous Infusions: . sodium chloride 75 mL/hr at 12/10/16 0201     LOS: 1 day    Time spent:    Zannie Cove, MD [facility] Pager [phone]  If 7PM-7AM, please contact night-coverage [email or web address] Password [email or web address] 12/10/2016, 12:30 PM

## 2016-12-10 NOTE — Progress Notes (Signed)
Pharmacy Antibiotic Note  Bryan Jennings is a 26 y.o. male with h/o VATS 08/2016 admitted on 12/09/2016 with chest pain/dyspnea, found to have chest wall abcess.  Pharmacy has been consulted for Vancomycin and Zosyn dosing - day #2. For IR drainage on 2/5. Afebrile, WBC down to WNL.  Vancomycin trough this afternoon is very slightly supratherapeutic (21) on 1250mg  IV q8h. SCr with slight trend up, 0.89>1.07, UOP 0.2 per documentation.  Plan: Reduce vancomycin to 1500mg  IV q12h Zosyn 3.375 g IV q8h (4h infusion) Monitor clinical progress, c/s, renal function, abx plan/LOT Vancomycin trough at new SS   Height: 5\' 10"  (177.8 cm) Weight: 252 lb 1.6 oz (114.4 kg) IBW/kg (Calculated) : 73  Temp (24hrs), Avg:98.8 F (37.1 C), Min:98 F (36.7 C), Max:99.2 F (37.3 C)   Recent Labs Lab 12/09/16 0113 12/09/16 0307 12/09/16 0646 12/10/16 0325 12/10/16 1309  WBC 13.1*  --   --  6.1  --   CREATININE 0.89  --   --  1.07  --   LATICACIDVEN  --  0.73 0.79  --   --   VANCOTROUGH  --   --   --   --  21*    Estimated Creatinine Clearance: 132.6 mL/min (by C-G formula based on SCr of 1.07 mg/dL).    No Known Allergies   Bryan Jennings, PharmD, BCPS Clinical Pharmacist 12/10/2016 2:33 PM

## 2016-12-10 NOTE — Sedation Documentation (Signed)
In CT waiting on MD

## 2016-12-11 LAB — BASIC METABOLIC PANEL
ANION GAP: 10 (ref 5–15)
BUN: 11 mg/dL (ref 6–20)
CHLORIDE: 106 mmol/L (ref 101–111)
CO2: 24 mmol/L (ref 22–32)
Calcium: 7.9 mg/dL — ABNORMAL LOW (ref 8.9–10.3)
Creatinine, Ser: 0.88 mg/dL (ref 0.61–1.24)
GFR calc Af Amer: >60 mL/min (ref >60)
GLUCOSE: 127 mg/dL — AB (ref 65–99)
POTASSIUM: 3.6 mmol/L (ref 3.5–5.1)
SODIUM: 140 mmol/L (ref 135–145)

## 2016-12-11 LAB — CBC
HCT: 35.9 % — ABNORMAL LOW (ref 39.0–52.0)
Hemoglobin: 11.5 g/dL — ABNORMAL LOW (ref 13.0–17.0)
MCH: 28.7 pg (ref 26.0–34.0)
MCHC: 32 g/dL (ref 30.0–36.0)
MCV: 89.5 fL (ref 78.0–100.0)
PLATELETS: 255 10*3/uL (ref 150–400)
RBC: 4.01 MIL/uL — ABNORMAL LOW (ref 4.22–5.81)
RDW: 13.8 % (ref 11.5–15.5)
WBC: 5 10*3/uL (ref 4.0–10.5)

## 2016-12-11 NOTE — Progress Notes (Signed)
PROGRESS NOTE    Bryan Jennings  ZOX:[id number] DOB: December 31, 1990 DOA: 12/09/2016 PCP: No PCP Per Patient  Brief Narrative: Bryan Jennings is a 26 y.o. male with a Past Medical History IV drug use, MRSA empyema s/p VATS in 10/17 and chest wall abscess which was drained, recent cocaine use, depression, hypertension, PTSD  who presented with shortness of breath. CT Chest shows chest wall abscess, fever and leukocytosis, CVTS and IR consulting. Drain placed by IR 12/09/16  Assessment & Plan:    Chest wall abscess -drained at time of admission in October -Chest CT scan shows a recurrent right chest wall abscess extending into the pleural space but separated by a fibrous capsule. No evidence of recurrent pleural space infection in the right lower chest at the site of the empyema per Dr.Van trigt -CVTS and IR consulted; s/p IR drained in place. Will defer to IR and CVTS team in terms of removing it. Output is minimal at this time.  -on Empiric Vanc/Zosyn -FU cultures  Recent Empyema  -in October 2017, s/p VATS -Pleural cultures at that time consistent with MRSA-patient discharged on Zyvox and followed by ID while IP. He completed therapy as prescribed 4 weeks    Essential hypertension -Continue home Lopressor    Polysubstance dependence including opioid type drug, episodic abuse  -Admitted  Using cocaine and methamphetamine in the past 3 days-denies alcohol or heroin use recently -limit narcotics, apparently going to NA meetings for few months now    PTSD (post-traumatic stress disorder)/Opioid-induced mood disorder  -Continue new preadmission Xanax and Neurontin  DVT prophylaxis: lovenox Code Status: Full Family Communication: No family at bedside Disposition Plan: Will determine discharge plan once the drain is out.   Consultants:   CVTS  IR   Antimicrobials Vanc/Zosyn 2/4->  Subjective: Slightly right sided chest wall pain otherwise no other complaints. Felt slightly warm  last night with some diaphoresis but remained afebrile.   Objective: Vitals:   12/10/16 1725 12/10/16 2133 12/11/16 0531 12/11/16 1026  BP:  128/70 115/64 118/62  Pulse:  (!) 106 (!) 59 76  Resp:  18 18   Temp:  98.6 F (37 C) 98 F (36.7 C)   TempSrc:  Oral Oral   SpO2: 96% 98% 94%   Weight:      Height:        Intake/Output Summary (Last 24 hours) at 12/11/16 1251 Last data filed at 12/11/16 0300  Gross per 24 hour  Intake             2405 ml  Output                0 ml  Net             2405 ml   Filed Weights   12/09/16 0107 12/09/16 0835  Weight: 113.4 kg (250 lb) 114.4 kg (252 lb 1.6 oz)    Examination:   General exam: Appears calm and comfortable, well built and nourished male, no distress Respiratory system: decreased BS on right, tenderness on R chest wall, anteriorly Cardiovascular system: S1 & S2 heard, RRR. Gastrointestinal system: Abdomen is nondistended, soft and nontender.  Normal bowel sounds heard. Central nervous system: Alert and oriented. No focal neurological deficits. Extremities: Symmetric 5 x 5 power. Skin: No rashes, lesions or ulcers Psychiatry: Judgement and insight appear normal. Mood & affect appropriate.   Chest wall drain in place with minimal serosanguineous fluid,.   Data Reviewed: I have personally reviewed following labs and imaging  studies  CBC:  Recent Labs Lab 12/09/16 0113 12/10/16 0325 12/11/16 0301  WBC 13.1* 6.1 5.0  HGB 13.0 11.7* 11.5*  HCT 39.2 36.9* 35.9*  MCV 88.9 89.8 89.5  PLT 268 237 255   Basic Metabolic Panel:  Recent Labs Lab 12/09/16 0113 12/10/16 0325 12/11/16 0301  NA 136 139 140  K 3.8 3.6 3.6  CL 100* 106 106  CO2 23 26 24   GLUCOSE 116* 123* 127*  BUN 7 14 11   CREATININE 0.89 1.07 0.88  CALCIUM 8.8* 8.1* 7.9*   GFR: Estimated Creatinine Clearance: 161.2 mL/min (by C-G formula based on SCr of 0.88 mg/dL). Liver Function Tests:  Recent Labs Lab 12/10/16 0325  AST 34  ALT 45    ALKPHOS 39  BILITOT 0.6  PROT 5.9*  ALBUMIN 2.5*   No results for input(s): LIPASE, AMYLASE in the last 168 hours. No results for input(s): AMMONIA in the last 168 hours. Coagulation Profile:  Recent Labs Lab 12/10/16 0325  INR 1.06   Cardiac Enzymes:  Recent Labs Lab 12/09/16 0113  TROPONINI <0.03   BNP (last 3 results) No results for input(s): PROBNP in the last 8760 hours. HbA1C: No results for input(s): HGBA1C in the last 72 hours. CBG: No results for input(s): GLUCAP in the last 168 hours. Lipid Profile: No results for input(s): CHOL, HDL, LDLCALC, TRIG, CHOLHDL, LDLDIRECT in the last 72 hours. Thyroid Function Tests: No results for input(s): TSH, T4TOTAL, FREET4, T3FREE, THYROIDAB in the last 72 hours. Anemia Panel: No results for input(s): VITAMINB12, FOLATE, FERRITIN, TIBC, IRON, RETICCTPCT in the last 72 hours. Urine analysis:    Component Value Date/Time   COLORURINE AMBER (A) 08/29/2016 0338   APPEARANCEUR CLOUDY (A) 08/29/2016 0338   LABSPEC 1.028 08/29/2016 0338   PHURINE 5.5 08/29/2016 0338   GLUCOSEU NEGATIVE 08/29/2016 0338   HGBUR NEGATIVE 08/29/2016 0338   BILIRUBINUR NEGATIVE 08/29/2016 0338   KETONESUR NEGATIVE 08/29/2016 0338   PROTEINUR NEGATIVE 08/29/2016 0338   NITRITE NEGATIVE 08/29/2016 0338   LEUKOCYTESUR NEGATIVE 08/29/2016 0338   Sepsis Labs: @LABRCNTIP (procalcitonin:4,lacticidven:4)  ) Recent Results (from the past 240 hour(s))  MRSA PCR Screening     Status: None   Collection Time: 12/10/16  8:03 AM  Result Value Ref Range Status   MRSA by PCR NEGATIVE NEGATIVE Final    Comment:        The GeneXpert MRSA Assay (FDA approved for NASAL specimens only), is one component of a comprehensive MRSA colonization surveillance program. It is not intended to diagnose MRSA infection nor to guide or monitor treatment for MRSA infections.   Aerobic/Anaerobic Culture (surgical/deep wound)     Status: None (Preliminary result)    Collection Time: 12/10/16  5:42 PM  Result Value Ref Range Status   Specimen Description ABSCESS CHEST  Final   Special Requests NONE  Final   Gram Stain   Final    MODERATE WBC PRESENT,BOTH PMN AND MONONUCLEAR RARE GRAM POSITIVE COCCI IN PAIRS    Culture PENDING  Incomplete   Report Status PENDING  Incomplete         Radiology Studies: Ct Image Guided Drainage By Percutaneous Catheter  Result Date: 12/11/2016 INDICATION: 26 year old male with a history of chest wall abscess EXAM: CT GUIDED DRAINAGE OF chest wall ABSCESS MEDICATIONS: The patient is currently admitted to the hospital and receiving intravenous antibiotics. The antibiotics were administered within an appropriate time frame prior to the initiation of the procedure. ANESTHESIA/SEDATION: 2.0 mg IV Versed 150 mcg  IV Fentanyl Moderate Sedation Time:  30 The patient was continuously monitored during the procedure by the interventional radiology nurse under my direct supervision. COMPLICATIONS: None TECHNIQUE: Informed written consent was obtained from the patient after a thorough discussion of the procedural risks, benefits and alternatives. All questions were addressed. Maximal Sterile Barrier Technique was utilized including caps, mask, sterile gowns, sterile gloves, sterile drape, hand hygiene and skin antiseptic. A timeout was performed prior to the initiation of the procedure. PROCEDURE: The operative field was prepped with Chlorhexidine in a sterile fashion, and a sterile drape was applied covering the operative field. A sterile gown and sterile gloves were used for the procedure. Local anesthesia was provided with 1% Lidocaine. Patient positioned supine position on CT gantry table. Scout CT was acquired for planning purposes. Once the patient is prepped and draped in the usual sterile fashion, the skin and subcutaneous tissues were generously infiltrated 1% lidocaine for local anesthesia. An 18 gauge guide needle was then advanced  into hypo dense fluid collection of the right chest wall musculature. Modified Seldinger technique was then used to place a 12 French pigtail drainage catheter. Approximately 5 cc of purulent material was aspirated. Catheter was sutured in position attached to gravity drainage. Final image was acquired. Sample was sent to lab for analysis. Patient tolerated the procedure well and remained hemodynamically stable throughout. No complications were encountered and no significant blood loss. FINDINGS: Scout CT demonstrates hypoechoic region involving right chest wall musculature, similar to the comparison CT. Approximately 5 cc of purulent material aspirated. Final CT demonstrates pigtail catheter within the phlegmon of the right chest wall musculature. IMPRESSION: Status post CT-guided drain placement into phlegmon/ abscess of the right chest wall musculature. Sample sent to the lab for analysis. Signed, Yvone Neu. Loreta Ave, DO [facility]iology Electronically Signed   By: Gilmer Mor D.O.   On: 12/11/2016 09:26        Scheduled Meds: . ALPRAZolam  0.25 mg Oral BID  . enoxaparin (LOVENOX) injection  40 mg Subcutaneous Q24H  . famotidine (PEPCID) IV  20 mg Intravenous Q12H  . gabapentin  300 mg Oral TID  . ketorolac  30 mg Intravenous Q6H  . metoprolol tartrate  12.5 mg Oral BID  . nicotine  14 mg Transdermal Daily  . pantoprazole (PROTONIX) IV  40 mg Intravenous Q24H  . piperacillin-tazobactam (ZOSYN)  IV  3.375 g Intravenous Q8H  . pneumococcal 23 valent vaccine  0.5 mL Intramuscular Tomorrow-1000  . sodium chloride flush  3 mL Intravenous Q12H  . vancomycin  1,500 mg Intravenous Q12H   Continuous Infusions: . sodium chloride 75 mL/hr at 12/11/16 0556     LOS: 2 days    Time spent:    Stephania Fragmin MD [facility]   If 7PM-7AM, please contact night-coverage [email or web address] Password [email or web address] 12/11/2016, 12:51 PM

## 2016-12-11 NOTE — Progress Notes (Signed)
      [address]       [place] [id number]             [phone]     CARDIOTHORACIC SURGERY PROGRESS NOTE    1. Empyema lung (HCC)   2. Empyema of right pleural space (HCC)     Subjective: IR placed pigtail catheter yesterday, patient feels like breathing is improved. Sore on the right, no other complaints.  No fevers overnight. Patient denies rigors, myalgias, nausea or vomiting.   Objective: Vital signs in last 24 hours: Temp:  [98 F (36.7 C)-99.2 F (37.3 C)] 98 F (36.7 C) (02/06 0531) Pulse Rate:  [59-106] 59 (02/06 0531) Cardiac Rhythm: Normal sinus rhythm (02/05 2000) Resp:  [12-20] 18 (02/06 0531) BP: (108-128)/(56-70) 115/64 (02/06 0531) SpO2:  [94 %-98 %] 94 % (02/06 0531)  Physical Exam:  General:   Well appearing, alert and oriented  Breath sounds: Diminished on the right side, Clear on the left  Heart sounds:  Regular rate and rhythm. No murmur, gallop or rub  Incisions:  Dressing over pigtail is clean and dry  Abdomen:  Soft and nontender  Extremities:  Warm, no edema   Intake/Output from previous day: 02/05 0701 - 02/06 0700 In: 2405 [I.V.:1650; IV Piggyback:750] Out: -  Intake/Output this shift: No intake/output data recorded.  Lab Results:  Recent Labs  12/10/16 0325 12/11/16 0301  WBC 6.1 5.0  HGB 11.7* 11.5*  HCT 36.9* 35.9*  PLT 237 255   BMET:  Recent Labs  12/10/16 0325 12/11/16 0301  NA 139 140  K 3.6 3.6  CL 106 106  CO2 26 24  GLUCOSE 123* 127*  BUN 14 11  CREATININE 1.07 0.88  CALCIUM 8.1* 7.9*    CBG (last 3)  No results for input(s): GLUCAP in the last 72 hours. PT/INR:   Recent Labs  12/10/16 0325  LABPROT 13.8  INR 1.06    Assessment/Plan: Right Empyema  1 Pigtail catheter with <10 cc clear drainage with some clot material in drainage bag. To gravity. Continue to monitor. Will need repeat surgical drainage if this does not provide adequate improvement. Check CXR tomorrow 2 Patient's  WBC is slightly improved from yesterday - cont vanc and zosyn. Cultures pending. 3 Medical management per primary service  Wells GuilesKelly Rayburn, PA-S 12/11/2016 7:42 AM

## 2016-12-12 ENCOUNTER — Inpatient Hospital Stay (HOSPITAL_COMMUNITY): Payer: Self-pay

## 2016-12-12 LAB — BASIC METABOLIC PANEL
Anion gap: 8 (ref 5–15)
BUN: 10 mg/dL (ref 6–20)
CHLORIDE: 110 mmol/L (ref 101–111)
CO2: 22 mmol/L (ref 22–32)
CREATININE: 1.22 mg/dL (ref 0.61–1.24)
Calcium: 8.5 mg/dL — ABNORMAL LOW (ref 8.9–10.3)
GFR calc Af Amer: >60 mL/min (ref >60)
GFR calc non Af Amer: >60 mL/min (ref >60)
Glucose, Bld: 103 mg/dL — ABNORMAL HIGH (ref 65–99)
Potassium: 4.2 mmol/L (ref 3.5–5.1)
Sodium: 140 mmol/L (ref 135–145)

## 2016-12-12 LAB — VANCOMYCIN, TROUGH: Vancomycin Tr: 13 ug/mL — ABNORMAL LOW (ref 15–20)

## 2016-12-12 MED ORDER — PANTOPRAZOLE SODIUM 40 MG PO TBEC
40.0000 mg | DELAYED_RELEASE_TABLET | Freq: Every day | ORAL | Status: DC
  Administered 2016-12-13 – 2016-12-15 (×3): 40 mg via ORAL
  Filled 2016-12-12 (×3): qty 1

## 2016-12-12 MED ORDER — FAMOTIDINE 20 MG PO TABS
20.0000 mg | ORAL_TABLET | Freq: Two times a day (BID) | ORAL | Status: DC
  Administered 2016-12-12 – 2016-12-15 (×6): 20 mg via ORAL
  Filled 2016-12-12 (×6): qty 1

## 2016-12-12 MED ORDER — VANCOMYCIN HCL 10 G IV SOLR
1750.0000 mg | Freq: Two times a day (BID) | INTRAVENOUS | Status: DC
  Administered 2016-12-12 – 2016-12-13 (×2): 1750 mg via INTRAVENOUS
  Filled 2016-12-12 (×4): qty 1750

## 2016-12-12 NOTE — Progress Notes (Signed)
Chief Complaint: Patient was seen today for (R)chest wall abscess drain   Supervising Physician: Irish Lack  Patient Status: [facility] - In-pt  Subjective: Follow up (R)chest wall abscess s/p perc drain 2/5 Feeling better.  Objective: Physical Exam: BP (!) 145/76 (BP Location: Left Arm)   Pulse 82   Temp 98 F (36.7 C) (Oral)   Resp 18   Ht 5\' 10"  (1.778 m)   Wt 252 lb 1.6 oz (114.4 kg)   SpO2 97%   BMI 36.17 kg/m  (R)chest wall drain intact, site clean, mildly tender. Output serosanguinous with some old clot, but unknown how much of the output is new.   Current Facility-Administered Medications:  .  0.9 %  sodium chloride infusion, , Intravenous, Continuous, Russella Dar, NP, Last Rate: 75 mL/hr at 12/11/16 0556 .  acetaminophen (TYLENOL) tablet 650 mg, 650 mg, Oral, Q6H PRN, 650 mg at 12/09/16 2032 **OR** acetaminophen (TYLENOL) suppository 650 mg, 650 mg, Rectal, Q6H PRN, Russella Dar, NP .  ALPRAZolam Prudy Feeler) tablet 0.25 mg, 0.25 mg, Oral, BID, Russella Dar, NP, 0.25 mg at 12/12/16 1005 .  enoxaparin (LOVENOX) injection 40 mg, 40 mg, Subcutaneous, Q24H, Earnie Larsson, RPH, 40 mg at 12/12/16 1007 .  famotidine (PEPCID) IVPB 20 mg premix, 20 mg, Intravenous, Q12H, Russella Dar, NP, Last Rate: 100 mL/hr at 12/12/16 1005, 20 mg at 12/12/16 1005 .  gabapentin (NEURONTIN) capsule 300 mg, 300 mg, Oral, TID, Russella Dar, NP, 300 mg at 12/12/16 1007 .  ketorolac (TORADOL) 30 MG/ML injection 30 mg, 30 mg, Intravenous, Q6H, Russella Dar, NP, 30 mg at 12/12/16 0537 .  metoprolol tartrate (LOPRESSOR) tablet 12.5 mg, 12.5 mg, Oral, BID, Russella Dar, NP, 12.5 mg at 12/12/16 1006 .  nicotine (NICODERM CQ - dosed in mg/24 hours) patch 14 mg, 14 mg, Transdermal, Daily, Zannie Cove, MD, 14 mg at 12/11/16 1021 .  ondansetron (ZOFRAN) tablet 4 mg, 4 mg, Oral, Q6H PRN **OR** ondansetron (ZOFRAN) injection 4 mg, 4 mg, Intravenous, Q6H PRN, Russella Dar, NP .   oxyCODONE (Oxy IR/ROXICODONE) immediate release tablet 10 mg, 10 mg, Oral, Q4H PRN, Kerin Perna, MD, 10 mg at 12/12/16 0802 .  pantoprazole (PROTONIX) injection 40 mg, 40 mg, Intravenous, Q24H, Russella Dar, NP, 40 mg at 12/12/16 0802 .  piperacillin-tazobactam (ZOSYN) IVPB 3.375 g, 3.375 g, Intravenous, Q8H, Rondell A Smith, MD, 3.375 g at 12/12/16 0537 .  pneumococcal 23 valent vaccine (PNU-IMMUNE) injection 0.5 mL, 0.5 mL, Intramuscular, Tomorrow-1000, Haydee Salter, MD .  sodium chloride flush (NS) 0.9 % injection 3 mL, 3 mL, Intravenous, Q12H, Russella Dar, NP, 3 mL at 12/11/16 2149 .  vancomycin (VANCOCIN) 1,500 mg in sodium chloride 0.9 % 500 mL IVPB, 1,500 mg, Intravenous, Q12H, Almon Hercules, RPH, 1,500 mg at 12/12/16 0225  Labs: CBC  Recent Labs  12/10/16 0325 12/11/16 0301  WBC 6.1 5.0  HGB 11.7* 11.5*  HCT 36.9* 35.9*  PLT 237 255   BMET  Recent Labs  12/10/16 0325 12/11/16 0301  NA 139 140  K 3.6 3.6  CL 106 106  CO2 26 24  GLUCOSE 123* 127*  BUN 14 11  CREATININE 1.07 0.88  CALCIUM 8.1* 7.9*   LFT  Recent Labs  12/10/16 0325  PROT 5.9*  ALBUMIN 2.5*  AST 34  ALT 45  ALKPHOS 39  BILITOT 0.6   PT/INR  Recent Labs  12/10/16 0325  LABPROT 13.8  INR 1.06     Studies/Results: Dg Chest 2 View  Result Date: 12/12/2016 CLINICAL DATA:  Right chest tube, right empyema EXAM: CHEST  2 VIEW COMPARISON:  CT 12/09/2016 and 12/10/2016. FINDINGS: Pigtail drainage catheter projects in the right chest wall lateral to the right sixth rib. Continued right pleural effusion and right lower lobe opacity, atelectasis or infiltrate. Heart is normal size. Left lung is clear. IMPRESSION: Right chest wall pigtail drainage catheter in place. Continued right pleural effusion with right lower lobe atelectasis or infiltrate. Electronically Signed   By: Charlett NoseKevin  Dover M.D.   On: 12/12/2016 08:50   Ct Image Guided Drainage By Percutaneous Catheter  Result Date:  12/11/2016 INDICATION: 26 year old male with a history of chest wall abscess EXAM: CT GUIDED DRAINAGE OF chest wall ABSCESS MEDICATIONS: The patient is currently admitted to the hospital and receiving intravenous antibiotics. The antibiotics were administered within an appropriate time frame prior to the initiation of the procedure. ANESTHESIA/SEDATION: 2.0 mg IV Versed 150 mcg IV Fentanyl Moderate Sedation Time:  30 The patient was continuously monitored during the procedure by the interventional radiology nurse under my direct supervision. COMPLICATIONS: None TECHNIQUE: Informed written consent was obtained from the patient after a thorough discussion of the procedural risks, benefits and alternatives. All questions were addressed. Maximal Sterile Barrier Technique was utilized including caps, mask, sterile gowns, sterile gloves, sterile drape, hand hygiene and skin antiseptic. A timeout was performed prior to the initiation of the procedure. PROCEDURE: The operative field was prepped with Chlorhexidine in a sterile fashion, and a sterile drape was applied covering the operative field. A sterile gown and sterile gloves were used for the procedure. Local anesthesia was provided with 1% Lidocaine. Patient positioned supine position on CT gantry table. Scout CT was acquired for planning purposes. Once the patient is prepped and draped in the usual sterile fashion, the skin and subcutaneous tissues were generously infiltrated 1% lidocaine for local anesthesia. An 18 gauge guide needle was then advanced into hypo dense fluid collection of the right chest wall musculature. Modified Seldinger technique was then used to place a 12 French pigtail drainage catheter. Approximately 5 cc of purulent material was aspirated. Catheter was sutured in position attached to gravity drainage. Final image was acquired. Sample was sent to lab for analysis. Patient tolerated the procedure well and remained hemodynamically stable  throughout. No complications were encountered and no significant blood loss. FINDINGS: Scout CT demonstrates hypoechoic region involving right chest wall musculature, similar to the comparison CT. Approximately 5 cc of purulent material aspirated. Final CT demonstrates pigtail catheter within the phlegmon of the right chest wall musculature. IMPRESSION: Status post CT-guided drain placement into phlegmon/ abscess of the right chest wall musculature. Sample sent to the lab for analysis. Signed, Yvone NeuJaime S. Loreta AveWagner, DO [facility] Electronically Signed   By: Gilmer MorJaime  Wagner D.O.   On: 12/11/2016 09:26    Assessment/Plan: (R)chest wall abscess s/p perc drain. Hard to assess output as pt says bag has not been emptied since placement. Output does look serosanguinous, not purulent. Will follow and remove drain as desired by treatment team.    LOS: 3 days   I spent a total of 15 minutes in face to face in clinical consultation, greater than 50% of which was counseling/coordinating care for right chest wall abscess drain  Donnabelle Blanchard PA-C 12/12/2016 10:13 AM

## 2016-12-12 NOTE — Progress Notes (Signed)
PROGRESS NOTE    Bryan Jennings  HYQ:[id number] DOB: [birth year] DOA: 12/09/2016 PCP: No PCP Per Patient  Brief Narrative: Bryan Jennings is a 26 y.o. male with a Past Medical History IV drug use, MRSA empyema s/p VATS in 10/17 and chest wall abscess which was drained, recent cocaine use, depression, hypertension, PTSD  who presented with shortness of breath. CT Chest shows chest wall abscess, fever and leukocytosis, CVTS and IR consulting. Drain placed by IR 12/09/16  Assessment & Plan:    Chest wall abscess -drained at time of admission in October -Chest CT scan shows a recurrent right chest wall abscess extending into the pleural space but separated by a fibrous capsule. No evidence of recurrent pleural space infection in the right lower chest at the site of the empyema per Dr.Van trigt -CVTS and IR consulted; s/p IR drained in place. Will defer to IR and CVTS team in terms of removing it. Output is minimal at this time.  -on Empiric Vanc/Zosyn -FU cultures  Recent Empyema  -in October 2017, s/p VATS -Pleural cultures at that time consistent with MRSA-patient discharged on Zyvox and followed by ID while IP. He completed therapy as prescribed 4 weeks    Essential hypertension -Continue home Lopressor    Polysubstance dependence including opioid type drug, episodic abuse  -Admitted  Using cocaine and methamphetamine in the past 3 days-denies alcohol or heroin use recently -limit narcotics, apparently going to NA meetings for few months now    PTSD (post-traumatic stress disorder)/Opioid-induced mood disorder  -Continue new preadmission Xanax and Neurontin  DVT prophylaxis: lovenox Code Status: Full Family Communication: No family at bedside Disposition Plan: Will determine discharge plan once the drain is out.   Consultants:   CVTS  IR   Antimicrobials Vanc/Zosyn 2/4->  Subjective: States his chest pain has significantly improved since yesterday beside the cath site  insertion pain. He has been tolerating po diet well. No other complaints.    Objective: Vitals:   12/11/16 2148 12/12/16 0458 12/12/16 1006 12/12/16 1419  BP: 136/74 118/74 (!) 145/76 129/70  Pulse: 74 (!) 57 82 60  Resp:  18  18  Temp:  98 F (36.7 C)  98.1 F (36.7 C)  TempSrc:  Oral  Oral  SpO2:  97%  96%  Weight:      Height:        Intake/Output Summary (Last 24 hours) at 12/12/16 1522 Last data filed at 12/12/16 1420  Gross per 24 hour  Intake                0 ml  Output              900 ml  Net             -900 ml   Filed Weights   12/09/16 0107 12/09/16 0835  Weight: 113.4 kg (250 lb) 114.4 kg (252 lb 1.6 oz)    Examination:   General exam: Appears calm and comfortable, well built and nourished male, no distress Respiratory system: decreased BS on right, tenderness on R chest wall, anteriorly Cardiovascular system: S1 & S2 heard, RRR. Gastrointestinal system: Abdomen is nondistended, soft and nontender.  Normal bowel sounds heard. Central nervous system: Alert and oriented. No focal neurological deficits. Extremities: Symmetric 5 x 5 power. Skin: No rashes, lesions or ulcers Psychiatry: Judgement and insight appear normal. Mood & affect appropriate.   Right sided Chest wall drain in place with minimal serosanguineous fluid,. No erythema, bleeding or  warmth.   Data Reviewed: I have personally reviewed following labs and imaging studies  CBC:  Recent Labs Lab 12/09/16 0113 12/10/16 0325 12/11/16 0301  WBC 13.1* 6.1 5.0  HGB 13.0 11.7* 11.5*  HCT 39.2 36.9* 35.9*  MCV 88.9 89.8 89.5  PLT 268 237 255   Basic Metabolic Panel:  Recent Labs Lab 12/09/16 0113 12/10/16 0325 12/11/16 0301 12/12/16 1128  NA 136 139 140 140  K 3.8 3.6 3.6 4.2  CL 100* 106 106 110  CO2 23 26 24 22   GLUCOSE 116* 123* 127* 103*  BUN 7 14 11 10   CREATININE 0.89 1.07 0.88 1.22  CALCIUM 8.8* 8.1* 7.9* 8.5*   GFR: Estimated Creatinine Clearance: 116.3 mL/min (by C-G  formula based on SCr of 1.22 mg/dL). Liver Function Tests:  Recent Labs Lab 12/10/16 0325  AST 34  ALT 45  ALKPHOS 39  BILITOT 0.6  PROT 5.9*  ALBUMIN 2.5*   No results for input(s): LIPASE, AMYLASE in the last 168 hours. No results for input(s): AMMONIA in the last 168 hours. Coagulation Profile:  Recent Labs Lab 12/10/16 0325  INR 1.06   Cardiac Enzymes:  Recent Labs Lab 12/09/16 0113  TROPONINI <0.03   BNP (last 3 results) No results for input(s): PROBNP in the last 8760 hours. HbA1C: No results for input(s): HGBA1C in the last 72 hours. CBG: No results for input(s): GLUCAP in the last 168 hours. Lipid Profile: No results for input(s): CHOL, HDL, LDLCALC, TRIG, CHOLHDL, LDLDIRECT in the last 72 hours. Thyroid Function Tests: No results for input(s): TSH, T4TOTAL, FREET4, T3FREE, THYROIDAB in the last 72 hours. Anemia Panel: No results for input(s): VITAMINB12, FOLATE, FERRITIN, TIBC, IRON, RETICCTPCT in the last 72 hours. Urine analysis:    Component Value Date/Time   COLORURINE AMBER (A) 08/29/2016 0338   APPEARANCEUR CLOUDY (A) 08/29/2016 0338   LABSPEC 1.028 08/29/2016 0338   PHURINE 5.5 08/29/2016 0338   GLUCOSEU NEGATIVE 08/29/2016 0338   HGBUR NEGATIVE 08/29/2016 0338   BILIRUBINUR NEGATIVE 08/29/2016 0338   KETONESUR NEGATIVE 08/29/2016 0338   PROTEINUR NEGATIVE 08/29/2016 0338   NITRITE NEGATIVE 08/29/2016 0338   LEUKOCYTESUR NEGATIVE 08/29/2016 0338   Sepsis Labs: @LABRCNTIP (procalcitonin:4,lacticidven:4)  ) Recent Results (from the past 240 hour(s))  MRSA PCR Screening     Status: None   Collection Time: 12/10/16  8:03 AM  Result Value Ref Range Status   MRSA by PCR NEGATIVE NEGATIVE Final    Comment:        The GeneXpert MRSA Assay (FDA approved for NASAL specimens only), is one component of a comprehensive MRSA colonization surveillance program. It is not intended to diagnose MRSA infection nor to guide or monitor treatment  for MRSA infections.   Aerobic/Anaerobic Culture (surgical/deep wound)     Status: None (Preliminary result)   Collection Time: 12/10/16  5:42 PM  Result Value Ref Range Status   Specimen Description ABSCESS CHEST  Final   Special Requests NONE  Final   Gram Stain   Final    MODERATE WBC PRESENT,BOTH PMN AND MONONUCLEAR RARE GRAM POSITIVE COCCI IN PAIRS    Culture   Final    FEW STAPHYLOCOCCUS AUREUS SUSCEPTIBILITIES TO FOLLOW NO ANAEROBES ISOLATED; CULTURE IN PROGRESS FOR 5 DAYS    Report Status PENDING  Incomplete         Radiology Studies: Dg Chest 2 View  Result Date: 12/12/2016 CLINICAL DATA:  Right chest tube, right empyema EXAM: CHEST  2 VIEW COMPARISON:  CT  12/09/2016 and 12/10/2016. FINDINGS: Pigtail drainage catheter projects in the right chest wall lateral to the right sixth rib. Continued right pleural effusion and right lower lobe opacity, atelectasis or infiltrate. Heart is normal size. Left lung is clear. IMPRESSION: Right chest wall pigtail drainage catheter in place. Continued right pleural effusion with right lower lobe atelectasis or infiltrate. Electronically Signed   By: Charlett NoseKevin  Dover M.D.   On: 12/12/2016 08:50   Ct Image Guided Drainage By Percutaneous Catheter  Result Date: 12/11/2016 INDICATION: 26 year old male with a history of chest wall abscess EXAM: CT GUIDED DRAINAGE OF chest wall ABSCESS MEDICATIONS: The patient is currently admitted to the hospital and receiving intravenous antibiotics. The antibiotics were administered within an appropriate time frame prior to the initiation of the procedure. ANESTHESIA/SEDATION: 2.0 mg IV Versed 150 mcg IV Fentanyl Moderate Sedation Time:  30 The patient was continuously monitored during the procedure by the interventional radiology nurse under my direct supervision. COMPLICATIONS: None TECHNIQUE: Informed written consent was obtained from the patient after a thorough discussion of the procedural risks, benefits and  alternatives. All questions were addressed. Maximal Sterile Barrier Technique was utilized including caps, mask, sterile gowns, sterile gloves, sterile drape, hand hygiene and skin antiseptic. A timeout was performed prior to the initiation of the procedure. PROCEDURE: The operative field was prepped with Chlorhexidine in a sterile fashion, and a sterile drape was applied covering the operative field. A sterile gown and sterile gloves were used for the procedure. Local anesthesia was provided with 1% Lidocaine. Patient positioned supine position on CT gantry table. Scout CT was acquired for planning purposes. Once the patient is prepped and draped in the usual sterile fashion, the skin and subcutaneous tissues were generously infiltrated 1% lidocaine for local anesthesia. An 18 gauge guide needle was then advanced into hypo dense fluid collection of the right chest wall musculature. Modified Seldinger technique was then used to place a 12 French pigtail drainage catheter. Approximately 5 cc of purulent material was aspirated. Catheter was sutured in position attached to gravity drainage. Final image was acquired. Sample was sent to lab for analysis. Patient tolerated the procedure well and remained hemodynamically stable throughout. No complications were encountered and no significant blood loss. FINDINGS: Scout CT demonstrates hypoechoic region involving right chest wall musculature, similar to the comparison CT. Approximately 5 cc of purulent material aspirated. Final CT demonstrates pigtail catheter within the phlegmon of the right chest wall musculature. IMPRESSION: Status post CT-guided drain placement into phlegmon/ abscess of the right chest wall musculature. Sample sent to the lab for analysis. Signed, Yvone NeuJaime S. Loreta AveWagner, DO [facility] Electronically Signed   By: Gilmer MorJaime  Wagner D.O.   On: 12/11/2016 09:26        Scheduled Meds: . ALPRAZolam   0.25 mg Oral BID  . enoxaparin (LOVENOX) injection  40 mg Subcutaneous Q24H  . famotidine  20 mg Oral BID  . gabapentin  300 mg Oral TID  . ketorolac  30 mg Intravenous Q6H  . metoprolol tartrate  12.5 mg Oral BID  . nicotine  14 mg Transdermal Daily  . [START ON 12/13/2016] pantoprazole  40 mg Oral Daily  . piperacillin-tazobactam (ZOSYN)  IV  3.375 g Intravenous Q8H  . pneumococcal 23 valent vaccine  0.5 mL Intramuscular Tomorrow-1000  . sodium chloride flush  3 mL Intravenous Q12H  . vancomycin  1,500 mg Intravenous Q12H   Continuous Infusions: . sodium chloride 75 mL/hr at 12/11/16 0556  LOS: 3 days    Time spent:    Stephania Fragmin MD [facility]   If 7PM-7AM, please contact night-coverage [email or web address] Password [email or web address] 12/12/2016, 3:22 PM

## 2016-12-12 NOTE — Progress Notes (Signed)
[address]       [place] [id number]             [phone]     CARDIOTHORACIC SURGERY PROGRESS NOTE    1. Empyema lung (HCC)   2. Empyema of right pleural space (HCC)   3. Empyema, right (HCC)   4. Chest wall abscess     Subjective: IR placed pigtail catheter several days ago , patient feels better.  No fevers overnight. Patient denies rigors, myalgias, nausea or vomiting.   Objective: Vital signs in last 24 hours: Temp:  [98 F (36.7 C)-99.4 F (37.4 C)] 98 F (36.7 C) (02/07 0458) Pulse Rate:  [57-82] 82 (02/07 1006) Cardiac Rhythm: Sinus bradycardia;Heart block (02/07 0701) Resp:  [18] 18 (02/07 0458) BP: (118-149)/(74-85) 145/76 (02/07 1006) SpO2:  [94 %-99 %] 97 % (02/07 0458)  Physical Exam:  General:   Well appearing, alert and oriented  Breath sounds: Diminished on the right side, Clear on the left  Heart sounds:  Regular rate and rhythm. No murmur, gallop or rub  Incisions:  Dressing over pigtail is clean and dry, to drainage bag  Abdomen:  Soft and nontender  Extremities:  Warm, no edema   Intake/Output from previous day: 02/06 0701 - 02/07 0700 In: 360 [P.O.:360] Out: 1101 [Urine:1100; Stool:1] Intake/Output this shift: No intake/output data recorded.  Lab Results:  Recent Labs  12/10/16 0325 12/11/16 0301  WBC 6.1 5.0  HGB 11.7* 11.5*  HCT 36.9* 35.9*  PLT 237 255   BMET:   Recent Labs  12/10/16 0325 12/11/16 0301  NA 139 140  K 3.6 3.6  CL 106 106  CO2 26 24  GLUCOSE 123* 127*  BUN 14 11  CREATININE 1.07 0.88  CALCIUM 8.1* 7.9*    CBG (last 3)  No results for input(s): GLUCAP in the last 72 hours. PT/INR:    Recent Labs  12/10/16 0325  LABPROT 13.8  INR 1.06     Dg Chest 2 View  Result Date: 12/12/2016 CLINICAL DATA:  Right chest tube, right empyema EXAM: CHEST  2 VIEW COMPARISON:  CT 12/09/2016 and 12/10/2016. FINDINGS: Pigtail drainage catheter projects in the right chest wall lateral to the  right sixth rib. Continued right pleural effusion and right lower lobe opacity, atelectasis or infiltrate. Heart is normal size. Left lung is clear. IMPRESSION: Right chest wall pigtail drainage catheter in place. Continued right pleural effusion with right lower lobe atelectasis or infiltrate. Electronically Signed   By: Charlett NoseKevin  Dover M.D.   On: 12/12/2016 08:50    Recent Results (from the past 240 hour(s))  MRSA PCR Screening     Status: None   Collection Time: 12/10/16  8:03 AM  Result Value Ref Range Status   MRSA by PCR NEGATIVE NEGATIVE Final    Comment:        The GeneXpert MRSA Assay (FDA approved for NASAL specimens only), is one component of a comprehensive MRSA colonization surveillance program. It is not intended to diagnose MRSA infection nor to guide or monitor treatment for MRSA infections.   Aerobic/Anaerobic Culture (surgical/deep wound)     Status: None (Preliminary result)   Collection Time: 12/10/16  5:42 PM  Result Value Ref Range Status   Specimen Description ABSCESS CHEST  Final   Special Requests NONE  Final   Gram Stain   Final    MODERATE WBC PRESENT,BOTH PMN AND MONONUCLEAR RARE GRAM POSITIVE COCCI IN PAIRS  Culture TOO YOUNG TO READ  Final   Report Status PENDING  Incomplete   Assessment/Plan: Right Empyema  1 Pigtail catheter with <10 cc clear drainage with some clot material in drainage bag. To gravity. Continue to monitor. Will need repeat surgical drainage if this does not provide adequate improvement. Check CXR tomorrow 2  cont vanc and zosyn. Cultures pending. 3 Medical management per primary service  Follow up pa lat chest xray after ct removed   Delight Ovens, PA-S 12/12/2016 11:13 AM  Patient ID: [name], male   DOB: [birth year], 26 y.o.   MRN: [id number]

## 2016-12-12 NOTE — Progress Notes (Signed)
Pharmacy Antibiotic Note  Bryan Jennings is a 26 y.o. male with h/o VATS 08/2016 admitted on 12/09/2016 with chest pain/dyspnea, found to have chest wall abcess.  Pharmacy has been consulted for Vancomycin and Zosyn dosing - day #4. Afebrile, WBC down to WNL.  Vancomycin level is slightly subtherapeutic, based on calculations, 1750/12h should put pt in therapeutic range. Small up trend in SCr.  Plan: Increase vancomycin to 1750 mg IV q12h Zosyn 3.375 g IV q8h (4h infusion) Monitor c/s, renal function, abx plan/LOT Vancomycin trough as needed   Height: 5\' 10"  (177.8 cm) Weight: 252 lb 1.6 oz (114.4 kg) IBW/kg (Calculated) : 73  Temp (24hrs), Avg:98.5 F (36.9 C), Min:98 F (36.7 C), Max:99.4 F (37.4 C)   Recent Labs Lab 12/09/16 0113 12/09/16 0307 12/09/16 0646 12/10/16 0325 12/10/16 1309 12/11/16 0301 12/12/16 1128 12/12/16 1525  WBC 13.1*  --   --  6.1  --  5.0  --   --   CREATININE 0.89  --   --  1.07  --  0.88 1.22  --   LATICACIDVEN  --  0.73 0.79  --   --   --   --   --   VANCOTROUGH  --   --   --   --  21*  --   --  13*    Estimated Creatinine Clearance: 116.3 mL/min (by C-G formula based on SCr of 1.22 mg/dL).    No Known Allergies    Agapito GamesAlison Marshae Azam, PharmD, BCPS Clinical Pharmacist 12/12/2016 5:05 PM

## 2016-12-13 ENCOUNTER — Inpatient Hospital Stay (HOSPITAL_COMMUNITY): Payer: Self-pay

## 2016-12-13 ENCOUNTER — Encounter (HOSPITAL_COMMUNITY): Payer: Self-pay | Admitting: *Deleted

## 2016-12-13 DIAGNOSIS — J189 Pneumonia, unspecified organism: Secondary | ICD-10-CM

## 2016-12-13 DIAGNOSIS — F1120 Opioid dependence, uncomplicated: Secondary | ICD-10-CM

## 2016-12-13 DIAGNOSIS — J9601 Acute respiratory failure with hypoxia: Secondary | ICD-10-CM

## 2016-12-13 DIAGNOSIS — F1920 Other psychoactive substance dependence, uncomplicated: Secondary | ICD-10-CM

## 2016-12-13 DIAGNOSIS — L02213 Cutaneous abscess of chest wall: Secondary | ICD-10-CM

## 2016-12-13 DIAGNOSIS — I10 Essential (primary) hypertension: Secondary | ICD-10-CM

## 2016-12-13 DIAGNOSIS — F1194 Opioid use, unspecified with opioid-induced mood disorder: Secondary | ICD-10-CM

## 2016-12-13 LAB — BASIC METABOLIC PANEL
ANION GAP: 14 (ref 5–15)
BUN: 10 mg/dL (ref 6–20)
CO2: 18 mmol/L — ABNORMAL LOW (ref 22–32)
Calcium: 8.6 mg/dL — ABNORMAL LOW (ref 8.9–10.3)
Chloride: 110 mmol/L (ref 101–111)
Creatinine, Ser: 2.07 mg/dL — ABNORMAL HIGH (ref 0.61–1.24)
GFR calc non Af Amer: 43 mL/min — ABNORMAL LOW (ref >60)
GFR, EST AFRICAN AMERICAN: 49 mL/min — AB (ref >60)
Glucose, Bld: 95 mg/dL (ref 65–99)
POTASSIUM: 5 mmol/L (ref 3.5–5.1)
SODIUM: 142 mmol/L (ref 135–145)

## 2016-12-13 MED ORDER — IOPAMIDOL (ISOVUE-300) INJECTION 61%
INTRAVENOUS | Status: AC
  Administered 2016-12-13: 75 mL
  Filled 2016-12-13: qty 75

## 2016-12-13 MED ORDER — DOXYCYCLINE HYCLATE 100 MG PO TABS
100.0000 mg | ORAL_TABLET | Freq: Two times a day (BID) | ORAL | Status: DC
  Administered 2016-12-13 – 2016-12-15 (×4): 100 mg via ORAL
  Filled 2016-12-13 (×4): qty 1

## 2016-12-13 NOTE — Progress Notes (Signed)
Patient stated that the IV in his left forearm was burning. Removed and placed order for the IV team. It was attempted three times. When patient was told that an ultrasound would be needed, stated it was too early to be stuck and for her to come back. Will advise day RN to place another IV request.

## 2016-12-13 NOTE — Progress Notes (Addendum)
      [address]       [place] [id number]             [phone]      Subjective:  No complaints.  States his tube is supposed to be removed today.  Objective: Vital signs in last 24 hours: Temp:  [98.1 F (36.7 C)-98.8 F (37.1 C)] 98.6 F (37 C) (02/08 0448) Pulse Rate:  [53-82] 53 (02/08 0448) Cardiac Rhythm: Bundle branch block;Other (Comment) (02/07 1900) Resp:  [18] 18 (02/08 0448) BP: (129-172)/(70-85) 157/85 (02/08 0448) SpO2:  [96 %-99 %] 96 % (02/08 0448)  Intake/Output from previous day: 02/07 0701 - 02/08 0700 In: -  Out: 680 [Urine:650; Drains:30]  General appearance: alert, cooperative and no distress Heart: regular rate and rhythm Lungs: diminished breath sounds bibasilar Abdomen: soft, non-tender; bowel sounds normal; no masses,  no organomegaly Wound: clean and dry  Lab Results:  Recent Labs  12/11/16 0301  WBC 5.0  HGB 11.5*  HCT 35.9*  PLT 255   BMET:  Recent Labs  12/11/16 0301 12/12/16 1128  NA 140 140  K 3.6 4.2  CL 106 110  CO2 24 22  GLUCOSE 127* 103*  BUN 11 10  CREATININE 0.88 1.22  CALCIUM 7.9* 8.5*    PT/INR: No results for input(s): LABPROT, INR in the last 72 hours. ABG    Component Value Date/Time   PHART 7.384 08/30/2016 0515   HCO3 25.5 08/30/2016 0515   TCO2 25 08/30/2016 1839   ACIDBASEDEF 2.0 08/29/2016 1230   O2SAT 98.7 08/30/2016 0515   CBG (last 3)  No results for input(s): GLUCAP in the last 72 hours.  Assessment/Plan:  1. H/O Empyema S/P VATS 08/29/16- readmitted with fevers, effusion- IR placed pigtail catheter minimal drainage- per patient to be removed today 2. Pulm- CXR stable, continue right sided pleural effusion 3. ID- no fevers, no leukocytosis 4. Dispo- patient stable, pigtail removal per IR, care per primary   LOS: 4 days    Bryan Jennings 12/13/2016

## 2016-12-13 NOTE — Progress Notes (Signed)
Patient lying in bed, no needs at this time. Call light within reach. 

## 2016-12-13 NOTE — Progress Notes (Signed)
Repeat CT reviewed with Dr. Lowella DandyHenn. Essentially no residual fluid at abscess site. Mild inflammatory changes. Small effusion. Since output has been minimal since placement, feel drain can be pulled at this time. D/w TCTS PA, who agrees  Drain removed at bedside without difficulty. Dressing applied. DC per primary.  Brayton ElKevin Carden Teel PA-C Interventional Radiology 12/13/2016 4:39 PM

## 2016-12-13 NOTE — Care Management Note (Signed)
Case Management Note Donn PieriniKristi Traeh Milroy RN, BSN Unit 2W-Case Manager [phone]  Patient Details  Name: Bryan CageJames Salo MRN: [id number] Date of Birth: 09-Feb-1991  Subjective/Objective:   Pt admitted with chest wall abscess and pigtail chest tube placed per IR                 Action/Plan: PTA Pt lived at home- referral for PCP needs- f/u appointment made with [facility] for Mar. 16, at 2pm. - spoke with pt at bedside- info given to pt on clinic and f/u appointment, Pt may have pigtail d/c'd vs need to go home with it. [facility] and any medication assistance pt may need with MATCH.   Expected Discharge Date:     12/13/16            Expected Discharge Plan:  Home/Self Care  In-House Referral:  NA  Discharge planning Services  CM Consult, Medication Assistance, Follow-up appt scheduled, [facility]  Post Acute Care Choice:  NA Choice offered to:  NA  DME Arranged:    DME Agency:     HH Arranged:    HH Agency:     Status of Service:  Completed, signed off  If discussed at [employer] Length of Stay Meetings, dates discussed:   Discharge Disposition: home/self care    Additional Comments:  12/13/16- 1650- Adylin Hankey RN, CM- pt for possible d/c later today- has had his pigtail cath removed- will not need HH services- pt will d/c on 2 weeks of oral abx- will assist pt with MATCH - [facility] along with list of pharmacies. Pt voices understanding of program and copay cost of $3 per script. Pt aware that MD will not be prescribing narcotics/pain meds.   Darrold SpanWebster, Rasheka Denard Hall, RN 12/13/2016, 4:52 PM

## 2016-12-13 NOTE — Progress Notes (Signed)
Pt feels good. Wants to go home (R)chest tube intact, site clean, mildly tender Output scant to minimal serosanguinous CXR stable Will get new CT.  If abscess resolved, will remove drain and pt can be discharged. If abscess persists, would recommend DC home with drain and follow up in outpt clinic. This was discussed with pt.  Brayton ElKevin Dee Maday PA-C Interventional Radiology 12/13/2016 12:07 PM

## 2016-12-13 NOTE — Progress Notes (Addendum)
Addendum   IR follow-up appreciated. Discussed with IR PA. Chest drain discontinued 12/13/16. Discussed with TCTS M.D. who have cleared him for discharge from their standpoint and will arrange outpatient follow-up within the next 2 weeks. Repeat BMP reviewed: Creatinine up to 2.07.  Assessment and plan:  Acute kidney injury: May be multifactorial related to vancomycin (mildly supratherapeutic vancomycin level of 21 on 12/10/16) and patient was getting scheduled Toradol. Discontinued vancomycin and Toradol. Change antibiotics to doxycycline .Increase IV fluids to 100 mL per hour. Follow BMP in a.m. Low potassium diet.  Bryan ScottHONGALGI,Bryan Remick, MD, FACP, FHM. [facility] Pager [phone]  If 7PM-7AM, please contact night-coverage [email or web address] Password [email or web address] 12/13/2016, 5:41 PM

## 2016-12-13 NOTE — Care Management Note (Signed)
Case Management Note Donn PieriniKristi Wesly Whisenant RN, BSN Unit 2W-Case Manager [phone]  Patient Details  Name: Milford CageJames Gehl MRN: [id number] Date of Birth: 1990-12-04  Subjective/Objective:   Pt admitted with chest wall abscess and pigtail chest tube placed per IR                 Action/Plan: PTA Pt lived at home- referral for PCP needs- f/u appointment made with [facility] for Mar. 16, at 2pm. - spoke with pt at bedside- info given to pt on clinic and f/u appointment, Pt may have pigtail d/c'd vs need to go home with it. [facility] and any medication assistance pt may need with MATCH.   Expected Discharge Date:                 Expected Discharge Plan:  Home/Self Care  In-House Referral:  NA  Discharge planning Services  CM Consult, Medication Assistance, Follow-up appt scheduled, [facility]  Post Acute Care Choice:    Choice offered to:     DME Arranged:    DME Agency:     HH Arranged:    HH Agency:     Status of Service:  In process, will continue to follow  If discussed at Long Length of Stay Meetings, dates discussed:    Additional Comments:  Darrold SpanWebster, Amanat Hackel Hall, RN 12/13/2016, 2:06 PM

## 2016-12-13 NOTE — Progress Notes (Signed)
PROGRESS NOTE    Bryan Jennings  GHW:[id number] DOB: [birth year] DOA: 12/09/2016 PCP: No PCP Per Patient   Brief Narrative: Bryan Jennings is a 26 y.o. male with a PMH of HTN, PTSD, depressive disorder, polysubstance abuse, heroin OD prior to previous hospitalization leading to bystander CPR, EMS resuscitation with Narcan, initial refusal to come to hospital, subsequent hospital admission in October 2017 when he underwent right VATS for MRSA empyema and right chest wall abscess drainage, transitioned to oral Zyvox at the time of discharge and completed 4 weeks treatment, denies IV drug abuse but admitted to using cocaine and methamphetamine in the 3 days prior to admission, presented to ED on 12/09/16 with complaints of right-sided chest pain, noted to be tachycardic in the 130s, hypoxic 89% on room air and CTA chest showed recurrent right chest wall abscess extending into the pleural space but separated by a fibrous capsule. TCTS consulted and at their recommendation, IR performed percutaneous drainage and drain was placed on 12/09/16. Abscess drained confirms MRSA. Improving.   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 - TCTS was consulted and according to them, CT chest showed recurrent right chest wall abscess extending into the pleural space but separated by a fibrous capsule. Per their recommendation, IR did CT-guided percutaneous tube on 12/09/16.  - He was empirically placed on IV vancomycin and Zosyn.  - Abscess culture confirms MRSA.  - Clinically improving. TCTS and IR continue to follow. Output is scant to minimal serosanguineous. As per IR, repeat CT chest and if abscess resolved, drain can be removed and patient can be discharged. If abscess persists, recommend DC home with drain  and follow-up in outpatient clinic.  - I discussed with infectious disease M.D. on call today who recommends transitioning to oral doxycycline at discharge for additional 2 weeks.   Community-acquired pneumonia/pneumonitis - CT chest 12/09/16 showed bronchial wall thickening and scattered groundglass opacities concerning for pneumonia. Has been on IV Zosyn and vancomycin since 2/3 and completed treatment for this indication.  Acute respiratory failure with hypoxia - Secondary to chest wall abscess and pneumonia. Treated as above. Resolved.  Sepsis - Patient met sepsis criteria on admission including tachycardia, leukocytosis and hypoxia. Source of sepsis included chest wall abscess and pneumonia. Sepsis resolved.  Essential hypertension -Continue home Lopressor. Reasonable control.     Polysubstance dependence including opioid type drug, episodic abuse  - Admitted using cocaine and methamphetamine in the past 3 days pta - denied alcohol or heroin use recently - limit narcotics, apparently going to NA meetings for few months now  PTSD (post-traumatic stress disorder)/Opioid-induced mood disorder  -Continue preadmission Xanax and Neurontin  Acute kidney injury - Creatinine has increased from 0.88 on 2/6 to 1.22 on 12/12/16. Repeat BMP now. Continue IV fluids until BMP reviewed.  Anemia - Stable.    DVT prophylaxis: lovenox Code Status: Full Family Communication: No family at bedside Disposition Plan:  pending follow-up CT chest and clearance by TCTS & IR. Possibly 2/9.  Consultants:   CVTS  IR    Procedures    right chest wall percutaneous drain placed by IR on 12/09/16.   Antimicrobials  IV Zosyn 2/3 > 2/8    IV vancomycin 2/3  Subjective: Mild chest tube site soreness but denies any other complaints.  Denies dyspnea, cough, fever or chills. Eager to go home. Denies any other complaints.     Objective: Vitals:   12/12/16 2106 12/13/16 0448 12/13/16 0848  12/13/16 1317  BP: (!) 172/78 (!) 157/85 (!) 137/57 (!) 156/65  Pulse: 63 (!) 53 (!) 58 (!) 56  Resp: _0 Temp: 98.8 F (37.1 C) 98.6 F (37 C) 98 F (36.7 C) 97.9 F (36.6 C)  TempSrc: Oral Oral Oral Oral  SpO2: 99% 96% 96% 97%  Weight:      Height:        Intake/Output Summary (Last 24 hours) at 12/13/16 1529 Last data filed at 12/13/16 1230  Gross per 24 hour  Intake              720 ml  Output              681 ml  Net               39 ml   Filed Weights   12/09/16 0107 12/09/16 0835  Weight: 113.4 kg (250 lb) 114.4 kg (252 lb 1.6 oz)    Examination:   General exam: Appears calm and comfortable, well built and nourished male, no distress Respiratory system: Slightly diminished breath sounds in the right base but otherwise clear to auscultation. Right chest wall percutaneous drain intact with minimal serosanguineous drainage. No increased work of breathing.  Cardiovascular system: S1 & S2 heard, RRR. Telemetry: SB in the 50s-SR in the 60s  Gastrointestinal system: Abdomen is nondistended, soft and nontender.  Normal bowel sounds heard. Central nervous system: Alert and oriented. No focal neurological deficits. Extremities: Symmetric 5 x 5 power. Skin: No rashes, lesions or ulcers Psychiatry: Judgement and insight appear normal. Mood & affect appropriate.     Data Reviewed: I have personally reviewed following labs and imaging studies  CBC:  Recent Labs Lab 12/09/16 0113 12/10/16 0325 12/11/16 0301  WBC 13.1* 6.1 5.0  HGB 13.0 11.7* 11.5*  HCT 39.2 36.9* 35.9*  MCV 88.9 89.8 89.5  PLT 268 237 947   Basic Metabolic Panel:  Recent Labs Lab 12/09/16 0113 12/10/16 0325 12/11/16 0301 12/12/16 1128  NA 136 139 140 140  K 3.8 3.6 3.6 4.2  CL 100* 106 106 110  CO2 _1 GLUCOSE 116* 123* 127* 103*  BUN _2 CREATININE 0.89 1.07 0.88 1.22  CALCIUM 8.8* 8.1* 7.9* 8.5*   GFR: Estimated Creatinine Clearance: 116.3 mL/min (by C-G  formula based on SCr of 1.22 mg/dL). Liver Function Tests:  Recent Labs Lab 12/10/16 0325  AST 34  ALT 45  ALKPHOS 39  BILITOT 0.6  PROT 5.9*  ALBUMIN 2.5*   No results for input(s): LIPASE, AMYLASE in the last 168 hours. No results for input(s): AMMONIA in the last 168 hours. Coagulation Profile:  Recent Labs Lab 12/10/16 0325  INR 1.06   Cardiac Enzymes:  Recent Labs Lab 12/09/16 0113  TROPONINI <0.03   BNP (last 3 results) No results for input(s): PROBNP in the last 8760 hours. HbA1C: No results for input(s): HGBA1C in the last 72 hours. CBG: No results for input(s): GLUCAP in the last 168 hours. Lipid Profile: No results for input(s): CHOL, HDL, LDLCALC, TRIG, CHOLHDL, LDLDIRECT in the last 72 hours. Thyroid Function Tests: No results for input(s): TSH, T4TOTAL, FREET4, T3FREE, THYROIDAB in the last 72 hours. Anemia Panel: No results for input(s): VITAMINB12, FOLATE, FERRITIN, TIBC, IRON, RETICCTPCT in the  last 72 hours. Urine analysis:    Component Value Date/Time   COLORURINE AMBER (A) 08/29/2016 0338   APPEARANCEUR CLOUDY (A) 08/29/2016 0338   LABSPEC 1.028 08/29/2016 0338   PHURINE 5.5 08/29/2016 0338   GLUCOSEU NEGATIVE 08/29/2016 0338   HGBUR NEGATIVE 08/29/2016 0338   BILIRUBINUR NEGATIVE 08/29/2016 0338   [place] 08/29/2016 0338   PROTEINUR NEGATIVE 08/29/2016 0338   NITRITE NEGATIVE 08/29/2016 0338   LEUKOCYTESUR NEGATIVE 08/29/2016 0338    Recent Results (from the past 240 hour(s))  MRSA PCR Screening     Status: None   Collection Time: 12/10/16  8:03 AM  Result Value Ref Range Status   MRSA by PCR NEGATIVE NEGATIVE Final    Comment:        The GeneXpert MRSA Assay (FDA approved for NASAL specimens only), is one component of a comprehensive MRSA colonization surveillance program. It is not intended to diagnose MRSA infection nor to guide or monitor treatment for MRSA infections.   Aerobic/Anaerobic Culture  (surgical/deep wound)     Status: None (Preliminary result)   Collection Time: 12/10/16  5:42 PM  Result Value Ref Range Status   Specimen Description ABSCESS CHEST  Final   Special Requests NONE  Final   Gram Stain   Final    MODERATE WBC PRESENT,BOTH PMN AND MONONUCLEAR RARE GRAM POSITIVE COCCI IN PAIRS    Culture   Final    FEW METHICILLIN RESISTANT STAPHYLOCOCCUS AUREUS NO ANAEROBES ISOLATED; CULTURE IN PROGRESS FOR 5 DAYS    Report Status PENDING  Incomplete   Organism ID, Bacteria METHICILLIN RESISTANT STAPHYLOCOCCUS AUREUS  Final      Susceptibility   Methicillin resistant staphylococcus aureus - MIC*    CIPROFLOXACIN >=8 RESISTANT Resistant     ERYTHROMYCIN <=0.25 SENSITIVE Sensitive     GENTAMICIN <=0.5 SENSITIVE Sensitive     OXACILLIN >=4 RESISTANT Resistant     TETRACYCLINE <=1 SENSITIVE Sensitive     VANCOMYCIN <=0.5 SENSITIVE Sensitive     TRIMETH/SULFA <=10 SENSITIVE Sensitive     CLINDAMYCIN <=0.25 SENSITIVE Sensitive     RIFAMPIN <=0.5 SENSITIVE Sensitive     Inducible Clindamycin NEGATIVE Sensitive     * FEW METHICILLIN RESISTANT STAPHYLOCOCCUS AUREUS         Radiology Studies: Dg Chest 2 View  Result Date: 12/13/2016 CLINICAL DATA:  Right chest wall abscess EXAM: CHEST  2 VIEW COMPARISON:  PA and lateral chest x-ray of December 12, 2016 FINDINGS: There is persistent volume loss on the right. The small caliber right-sided chest tube is in stable position and may lie outside the pleural space. There is no pneumothorax. The right lung is clear. The heart and pulmonary vascularity are normal. IMPRESSION: Positioning of the small caliber pleural drainage catheter on the right is worrisome for at least a partially extrapleural position. Correlation as to the adequacy of the functioning of the tube is needed. Chest CT scanning could be employed to assess positioning of the tube. There is a stable appearance of the moderate size right pleural effusion. Electronically  Signed   By: David  Martinique M.D.   On: 12/13/2016 08:08   Dg Chest 2 View  Result Date: 12/12/2016 CLINICAL DATA:  Right chest tube, right empyema EXAM: CHEST  2 VIEW COMPARISON:  CT 12/09/2016 and 12/10/2016. FINDINGS: Pigtail drainage catheter projects in the right chest wall lateral to the right sixth rib. Continued right pleural effusion and right lower lobe opacity, atelectasis or infiltrate. Heart is normal size. Left lung  is clear. IMPRESSION: Right chest wall pigtail drainage catheter in place. Continued right pleural effusion with right lower lobe atelectasis or infiltrate. Electronically Signed   By: Rolm Baptise M.D.   On: 12/12/2016 08:50        Scheduled Meds: . iopamidol      . ALPRAZolam  0.25 mg Oral BID  . enoxaparin (LOVENOX) injection  40 mg Subcutaneous Q24H  . famotidine  20 mg Oral BID  . gabapentin  300 mg Oral TID  . ketorolac  30 mg Intravenous Q6H  . metoprolol tartrate  12.5 mg Oral BID  . nicotine  14 mg Transdermal Daily  . pantoprazole  40 mg Oral Daily  . pneumococcal 23 valent vaccine  0.5 mL Intramuscular Tomorrow-1000  . sodium chloride flush  3 mL Intravenous Q12H  . vancomycin  1,750 mg Intravenous Q12H   Continuous Infusions: . sodium chloride 75 mL/hr at 12/12/16 2305     LOS: 4 days      Gerlean Ren MD [facility]   If 7PM-7AM, please contact night-coverage [email or web address] Password [facility] 12/13/2016, 3:29 PM

## 2016-12-14 DIAGNOSIS — N179 Acute kidney failure, unspecified: Secondary | ICD-10-CM

## 2016-12-14 LAB — URINALYSIS, ROUTINE W REFLEX MICROSCOPIC
Bilirubin Urine: NEGATIVE
Glucose, UA: NEGATIVE mg/dL
Hgb urine dipstick: NEGATIVE
Ketones, ur: NEGATIVE mg/dL
Leukocytes, UA: NEGATIVE
Nitrite: NEGATIVE
Protein, ur: NEGATIVE mg/dL
Specific Gravity, Urine: 1.005 (ref 1.005–1.030)
pH: 7 (ref 5.0–8.0)

## 2016-12-14 LAB — BASIC METABOLIC PANEL WITH GFR
Anion gap: 11 (ref 5–15)
BUN: 8 mg/dL (ref 6–20)
CO2: 21 mmol/L — ABNORMAL LOW (ref 22–32)
Calcium: 8.5 mg/dL — ABNORMAL LOW (ref 8.9–10.3)
Chloride: 108 mmol/L (ref 101–111)
Creatinine, Ser: 2.26 mg/dL — ABNORMAL HIGH (ref 0.61–1.24)
GFR calc Af Amer: 44 mL/min — ABNORMAL LOW
GFR calc non Af Amer: 38 mL/min — ABNORMAL LOW
Glucose, Bld: 89 mg/dL (ref 65–99)
Potassium: 4.2 mmol/L (ref 3.5–5.1)
Sodium: 140 mmol/L (ref 135–145)

## 2016-12-14 NOTE — Progress Notes (Signed)
PROGRESS NOTE    Bryan Jennings  OEH:[id number] DOB: May 02, 1991 DOA: 12/09/2016 PCP: No PCP Per Patient   Brief Narrative: Bryan Jennings is a 26 y.o. male with a PMH of HTN, PTSD, depressive disorder, polysubstance abuse, heroin OD prior to previous hospitalization leading to bystander CPR, EMS resuscitation with Narcan, initial refusal to come to hospital, subsequent hospital admission in October 2017 when he underwent right VATS for MRSA empyema and right chest wall abscess drainage, transitioned to oral Zyvox at the time of discharge and completed 4 weeks treatment, denies IV drug abuse but admitted to using cocaine and methamphetamine in the 3 days prior to admission, presented to ED on 12/09/16 with complaints of right-sided chest pain, noted to be tachycardic in the 130s, hypoxic 89% on room air and CTA chest showed recurrent right chest wall abscess extending into the pleural space but separated by a fibrous capsule. TCTS consulted and at their recommendation, IR performed percutaneous drainage and drain was placed on 12/09/16. Abscess drained confirms MRSA. Improving.   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 - TCTS was consulted and according to them, CT chest showed recurrent right chest wall abscess extending into the pleural space but separated by a fibrous capsule. Per their recommendation, IR did CT-guided percutaneous tube on 12/09/16.  - He was empirically placed on IV vancomycin and Zosyn.  - Abscess culture confirms MRSA.  - Clinically improving. TCTS and IR continued to follow. Abscess improved by CT chest. Drain was removed 2/8. IR signed off. TCTS will arrange outpatient follow-up within the next 2 weeks. - I discussed with infectious disease M.D. on call 2/8 who  recommends transitioning to oral doxycycline at discharge for additional 2 weeks.   Community-acquired pneumonia/pneumonitis - CT chest 12/09/16 showed bronchial wall thickening and scattered groundglass opacities concerning for pneumonia. Has been on IV Zosyn and vancomycin since 2/3 and completed treatment for this indication.  Acute respiratory failure with hypoxia - Secondary to chest wall abscess and pneumonia. Treated as above. Resolved.  Sepsis - Patient met sepsis criteria on admission including tachycardia, leukocytosis and hypoxia. Source of sepsis included chest wall abscess and pneumonia. Sepsis resolved.  Essential hypertension -Continue home Lopressor. Reasonable control.     Polysubstance dependence including opioid type drug, episodic abuse  - Admitted using cocaine and methamphetamine in the past 3 days pta - denied alcohol or heroin use recently - limit narcotics, apparently going to NA meetings for few months now  PTSD (post-traumatic stress disorder)/Opioid-induced mood disorder  -Continue preadmission Xanax and Neurontin  Acute kidney injury - Creatinine has increased from 0.88 on 2/6 to 1.22 on 12/12/16. Creatinine steadily increased to 2.26 on 2/9. Multifactorial secondary to vancomycin (level mildly elevated at 21), scheduled IV Toradol and IV contrast on 2/4 and 2/8. Discontinued vancomycin and Toradol. Avoid nephrotoxic medications or contrast. IV fluids. Follow BMP. Nephrology consulted 2/9.  Anemia - Stable.    DVT prophylaxis: lovenox Code Status: Full Family Communication: No family at bedside Disposition Plan:  pending follow-up CT chest and clearance by TCTS & IR. Possibly 2/9.  Consultants:   CVTS  IR    Procedures    right chest wall percutaneous drain placed by IR on 12/09/16.   Antimicrobials  IV Zosyn 2/3 > 2/8  IV vancomycin 2/3-discontinued  Oral doxycycline  Subjective: Denies complaints. Anxious to go home. Chest wall  drain removed by IR on 2/8. No chest pain or dyspnea reported.   Objective: Vitals:   12/13/16 2024 12/13/16 2138 12/14/16 0433 12/14/16 0900  BP: (!) 151/73  134/68 (!) 151/70  Pulse: (!) 56 60 64 78  Resp: 18  18 (!) 24  Temp: 98.6 F (37 C)  99.1 F (37.3 C) 99.3 F (37.4 C)  TempSrc: Oral  Oral Oral  SpO2: 99%  94% 95%  Weight:      Height:        Intake/Output Summary (Last 24 hours) at 12/14/16 1506 Last data filed at 12/14/16 1300  Gross per 24 hour  Intake              476 ml  Output             2700 ml  Net            -2224 ml   Filed Weights   12/09/16 0107 12/09/16 0835  Weight: 113.4 kg (250 lb) 114.4 kg (252 lb 1.6 oz)    Examination:   General exam: Appears calm and comfortable, well built and nourished male, no distress Respiratory system: Slightly diminished breath sounds in the right base but otherwise clear to auscultation. Right chest wall Previous drain site dressing clean and dry. No increased work of breathing.  Cardiovascular system: S1 & S2 heard, RRR.  Gastrointestinal system: Abdomen is nondistended, soft and nontender.  Normal bowel sounds heard. Central nervous system: Alert and oriented. No focal neurological deficits. Extremities: Symmetric 5 x 5 power. Skin: No rashes, lesions or ulcers Psychiatry: Judgement and insight appear normal. Mood & affect appropriate.     Data Reviewed: I have personally reviewed following labs and imaging studies  CBC:  Recent Labs Lab 12/09/16 0113 12/10/16 0325 12/11/16 0301  WBC 13.1* 6.1 5.0  HGB 13.0 11.7* 11.5*  HCT 39.2 36.9* 35.9*  MCV 88.9 89.8 89.5  PLT 268 237 650   Basic Metabolic Panel:  Recent Labs Lab 12/10/16 0325 12/11/16 0301 12/12/16 1128 12/13/16 1646 12/14/16 0211  NA 139 140 140 142 140  K 3.6 3.6 4.2 5.0 4.2  CL 106 106 110 110 108  CO2 26 24 22  18* 21*  GLUCOSE 123* 127* 103* 95 89  BUN 14 11 10 10 8   CREATININE 1.07 0.88 1.22 2.07* 2.26*  CALCIUM 8.1* 7.9*  8.5* 8.6* 8.5*   GFR: Estimated Creatinine Clearance: 62.8 mL/min (by C-G formula based on SCr of 2.26 mg/dL (H)). Liver Function Tests:  Recent Labs Lab 12/10/16 0325  AST 34  ALT 45  ALKPHOS 39  BILITOT 0.6  PROT 5.9*  ALBUMIN 2.5*   No results for input(s): LIPASE, AMYLASE in the last 168 hours. No results for input(s): AMMONIA in the last 168 hours. Coagulation Profile:  Recent Labs Lab 12/10/16 0325  INR 1.06   Cardiac Enzymes:  Recent Labs Lab 12/09/16 0113  TROPONINI <0.03   BNP (last 3 results) No results for input(s): PROBNP in the last 8760 hours. HbA1C: No results for input(s): HGBA1C in the last 72 hours. CBG: No results for input(s): GLUCAP in the last 168 hours. Lipid Profile: No results for input(s): CHOL, HDL, LDLCALC, TRIG, CHOLHDL, LDLDIRECT in the last 72 hours. Thyroid Function Tests: No results for input(s): TSH, T4TOTAL, FREET4, T3FREE, THYROIDAB in the last 72 hours. Anemia Panel: No results for input(s): VITAMINB12, FOLATE, FERRITIN,  TIBC, IRON, RETICCTPCT in the last 72 hours. Urine analysis:    Component Value Date/Time   COLORURINE AMBER (A) 08/29/2016 0338   APPEARANCEUR CLOUDY (A) 08/29/2016 0338   LABSPEC 1.028 08/29/2016 0338   PHURINE 5.5 08/29/2016 0338   GLUCOSEU NEGATIVE 08/29/2016 0338   HGBUR NEGATIVE 08/29/2016 0338   BILIRUBINUR NEGATIVE 08/29/2016 0338   [place] 08/29/2016 0338   PROTEINUR NEGATIVE 08/29/2016 0338   NITRITE NEGATIVE 08/29/2016 0338   LEUKOCYTESUR NEGATIVE 08/29/2016 0338    Recent Results (from the past 240 hour(s))  MRSA PCR Screening     Status: None   Collection Time: 12/10/16  8:03 AM  Result Value Ref Range Status   MRSA by PCR NEGATIVE NEGATIVE Final    Comment:        The GeneXpert MRSA Assay (FDA approved for NASAL specimens only), is one component of a comprehensive MRSA colonization surveillance program. It is not intended to diagnose MRSA infection nor to guide  or monitor treatment for MRSA infections.   Aerobic/Anaerobic Culture (surgical/deep wound)     Status: None (Preliminary result)   Collection Time: 12/10/16  5:42 PM  Result Value Ref Range Status   Specimen Description ABSCESS CHEST  Final   Special Requests NONE  Final   Gram Stain   Final    MODERATE WBC PRESENT,BOTH PMN AND MONONUCLEAR RARE GRAM POSITIVE COCCI IN PAIRS    Culture   Final    FEW METHICILLIN RESISTANT STAPHYLOCOCCUS AUREUS NO ANAEROBES ISOLATED; CULTURE IN PROGRESS FOR 5 DAYS    Report Status PENDING  Incomplete   Organism ID, Bacteria METHICILLIN RESISTANT STAPHYLOCOCCUS AUREUS  Final      Susceptibility   Methicillin resistant staphylococcus aureus - MIC*    CIPROFLOXACIN >=8 RESISTANT Resistant     ERYTHROMYCIN <=0.25 SENSITIVE Sensitive     GENTAMICIN <=0.5 SENSITIVE Sensitive     OXACILLIN >=4 RESISTANT Resistant     TETRACYCLINE <=1 SENSITIVE Sensitive     VANCOMYCIN <=0.5 SENSITIVE Sensitive     TRIMETH/SULFA <=10 SENSITIVE Sensitive     CLINDAMYCIN <=0.25 SENSITIVE Sensitive     RIFAMPIN <=0.5 SENSITIVE Sensitive     Inducible Clindamycin NEGATIVE Sensitive     * FEW METHICILLIN RESISTANT STAPHYLOCOCCUS AUREUS         Radiology Studies: Dg Chest 2 View  Result Date: 12/13/2016 CLINICAL DATA:  Right chest wall abscess EXAM: CHEST  2 VIEW COMPARISON:  PA and lateral chest x-ray of December 12, 2016 FINDINGS: There is persistent volume loss on the right. The small caliber right-sided chest tube is in stable position and may lie outside the pleural space. There is no pneumothorax. The right lung is clear. The heart and pulmonary vascularity are normal. IMPRESSION: Positioning of the small caliber pleural drainage catheter on the right is worrisome for at least a partially extrapleural position. Correlation as to the adequacy of the functioning of the tube is needed. Chest CT scanning could be employed to assess positioning of the tube. There is a stable  appearance of the moderate size right pleural effusion. Electronically Signed   By: David  Martinique M.D.   On: 12/13/2016 08:08   Ct Chest W Contrast  Result Date: 12/13/2016 CLINICAL DATA:  26 year old with history of empyema, thoracotomy and chest wall abscess. Follow-up extrapleural right chest drain. EXAM: CT CHEST WITH CONTRAST TECHNIQUE: Multidetector CT imaging of the chest was performed during intravenous contrast administration. CONTRAST:  73m ISOVUE-300 IOPAMIDOL (ISOVUE-300) INJECTION 61% COMPARISON:  12/09/2016 FINDINGS:  Cardiovascular: Normal caliber of the thoracic aorta and pulmonary arteries. Mediastinum/Nodes: Again noted are numerous small lymph nodes scattered throughout the mediastinum and axillary regions. These nodes could be reactive in etiology. Esophagus is unremarkable. The right chest wall extrapleural abscess/fluid collection has essentially resolved following placement of the pigtail drainage catheter. There continues to be soft tissue thickening around the catheter. There is no discrete fluid collection remaining in this area. No new chest wall fluid or abscess collections. Lungs/Pleura: Again noted is a small amount of irregular right pleural fluid and right pleural thickening. The amount of right pleural fluid has slightly increased since the previous examination. There is no left pleural fluid. Trachea and mainstem bronchi are patent. There is stable volume loss in the right lung. Stable thickening along the right major fissure. Patchy ground-glass or airspace densities in left lung have decreased. There is a small amount of residual ground-glass disease in the left upper lung. Upper Abdomen: Small amount of contrast in the renal collecting system related to the timing of the contrast injection. Imaging was slightly delayed because of the contrast was hand injected rather than power injected. No acute abnormality in the upper abdomen. Musculoskeletal: Again noted is fracture of the  anterior right fifth rib. IMPRESSION: The right chest wall fluid collections/abscess has resolved following placement of the drainage catheter. No significant fluid remaining around the catheter. Persistent soft tissue swelling along the right chest wall. Slightly increased amount of right pleural fluid with chronic changes in the right lung. Decreased ground-glass or airspace densities in the left lung. This may represent resolving infection or inflammation. Electronically Signed   By: Markus Daft M.D.   On: 12/13/2016 17:42        Scheduled Meds: . ALPRAZolam  0.25 mg Oral BID  . doxycycline  100 mg Oral Q12H  . enoxaparin (LOVENOX) injection  40 mg Subcutaneous Q24H  . famotidine  20 mg Oral BID  . gabapentin  300 mg Oral TID  . metoprolol tartrate  12.5 mg Oral BID  . nicotine  14 mg Transdermal Daily  . pantoprazole  40 mg Oral Daily  . pneumococcal 23 valent vaccine  0.5 mL Intramuscular Tomorrow-1000  . sodium chloride flush  3 mL Intravenous Q12H   Continuous Infusions: . sodium chloride 100 mL/hr at 12/14/16 0928     LOS: 5 days      Gerlean Ren MD [facility]   If 7PM-7AM, please contact night-coverage [email or web address] Password [email or web address] 12/14/2016, 3:06 PM

## 2016-12-14 NOTE — Consult Note (Signed)
HPI: I was asked by Dr. Algis Liming to see Bryan Jennings who is a 26 y.o. male PMH of IV drug use, h/o empyema s/p VATS, HTN, and depression who presented with SOB. CT chest showed a recurrent chest wall abscess. IR placed drain and patient was put on empiric Vancomycin and Zosyn. Abscess culture confirmed MRSA.   Patient developed AKI on 2/7 when Cr increased to 1.22 from baseline of 0.7-0.9. Cr continued to uptrend to 2.26 and nephrology was asked to consult.   Past Medical History:  Diagnosis Date  . Hypertension   . Substance abuse    Past Surgical History:  Procedure Laterality Date  . APPENDECTOMY    . THORACOTOMY Right 08/29/2016   Procedure: THORACOTOMY MAJOR;  Surgeon: Ivin Poot, MD;  Location: [facility];  Service: Thoracic;  Laterality: Right;  Marland Kitchen VIDEO ASSISTED THORACOSCOPY (VATS)/EMPYEMA Right 08/29/2016   Procedure: VIDEO ASSISTED THORACOSCOPY (VATS)/ DRAINAGE EMPYEMA;  Surgeon: Ivin Poot, MD;  Location: [facility] OR;  Service: Thoracic;  Laterality: Right;   Social History:  reports that he has been smoking Cigarettes.  He has a 12.00 pack-year smoking history. He has never used smokeless tobacco. He reports that he drinks alcohol. He reports that he uses drugs, including Cocaine, Heroin, and Benzodiazepines. Allergies: No Known Allergies Family History  Problem Relation Age of Onset  . Diabetes Mother   . Hypertension Mother   . CAD Father 2    ROS: No nausea/vomiting. Urinating well. Good PO intake. No SOB.  Blood pressure (!) 151/70, pulse 78, temperature 99.3 F (37.4 C), temperature source Oral, resp. rate (!) 24, height _0  (1.778 m), weight 252 lb 1.6 oz (114.4 kg), SpO2 95 %.  General appearance: alert, cooperative and no distress Head: Normocephalic, without obvious abnormality, atraumatic Eyes: PERRL. EOMI.  Resp: clear to auscultation bilaterally Chest wall: no tenderness Cardio: regular rate and rhythm, S1, S2 normal, no murmur, click, rub or  gallop GI: soft, non-tender; bowel sounds normal; no masses,  no organomegaly Extremities: extremities normal, atraumatic, no cyanosis or edema Skin: Skin color, texture, turgor normal. No rashes or lesions Neurologic: Grossly normal Results for orders placed or performed during the hospital encounter of 12/09/16 (from the past 48 hour(s))  Vancomycin, trough     Status: Abnormal   Collection Time: 12/12/16  3:25 PM  Result Value Ref Range   Vancomycin Tr 13 (L) 15 - 20 ug/mL  Basic metabolic panel     Status: Abnormal   Collection Time: 12/13/16  4:46 PM  Result Value Ref Range   Sodium 142 135 - 145 mmol/L   Potassium 5.0 3.5 - 5.1 mmol/L   Chloride 110 101 - 111 mmol/L   CO2 18 (L) 22 - 32 mmol/L   Glucose, Bld 95 65 - 99 mg/dL   BUN 10 6 - 20 mg/dL   Creatinine, Ser 2.07 (H) 0.61 - 1.24 mg/dL   Calcium 8.6 (L) 8.9 - 10.3 mg/dL   GFR calc non Af Amer 43 (L) >60 mL/min   GFR calc Af Amer 49 (L) >60 mL/min    Comment: (NOTE) The eGFR has been calculated using the CKD EPI equation. This calculation has not been validated in all clinical situations. eGFR's persistently <60 mL/min signify possible Chronic Kidney Disease.    Anion gap 14 5 - 15  Basic metabolic panel     Status: Abnormal   Collection Time: 12/14/16  2:11 AM  Result Value Ref Range   Sodium 140 135 -  145 mmol/L   Potassium 4.2 3.5 - 5.1 mmol/L    Comment: HEMOLYSIS AT THIS LEVEL MAY AFFECT RESULT   Chloride 108 101 - 111 mmol/L   CO2 21 (L) 22 - 32 mmol/L   Glucose, Bld 89 65 - 99 mg/dL   BUN 8 6 - 20 mg/dL   Creatinine, Ser 2.26 (H) 0.61 - 1.24 mg/dL   Calcium 8.5 (L) 8.9 - 10.3 mg/dL   GFR calc non Af Amer 38 (L) >60 mL/min   GFR calc Af Amer 44 (L) >60 mL/min    Comment: (NOTE) The eGFR has been calculated using the CKD EPI equation. This calculation has not been validated in all clinical situations. eGFR's persistently <60 mL/min signify possible Chronic Kidney Disease.    Anion gap 11 5 - 15   Dg  Chest 2 View  Result Date: 12/13/2016 CLINICAL DATA:  Right chest wall abscess EXAM: CHEST  2 VIEW COMPARISON:  PA and lateral chest x-ray of December 12, 2016 FINDINGS: There is persistent volume loss on the right. The small caliber right-sided chest tube is in stable position and may lie outside the pleural space. There is no pneumothorax. The right lung is clear. The heart and pulmonary vascularity are normal. IMPRESSION: Positioning of the small caliber pleural drainage catheter on the right is worrisome for at least a partially extrapleural position. Correlation as to the adequacy of the functioning of the tube is needed. Chest CT scanning could be employed to assess positioning of the tube. There is a stable appearance of the moderate size right pleural effusion. Electronically Signed   By: David  Martinique M.D.   On: 12/13/2016 08:08   Ct Chest W Contrast  Result Date: 12/13/2016 CLINICAL DATA:  26 year old with history of empyema, thoracotomy and chest wall abscess. Follow-up extrapleural right chest drain. EXAM: CT CHEST WITH CONTRAST TECHNIQUE: Multidetector CT imaging of the chest was performed during intravenous contrast administration. CONTRAST:  49m ISOVUE-300 IOPAMIDOL (ISOVUE-300) INJECTION 61% COMPARISON:  12/09/2016 FINDINGS: Cardiovascular: Normal caliber of the thoracic aorta and pulmonary arteries. Mediastinum/Nodes: Again noted are numerous small lymph nodes scattered throughout the mediastinum and axillary regions. These nodes could be reactive in etiology. Esophagus is unremarkable. The right chest wall extrapleural abscess/fluid collection has essentially resolved following placement of the pigtail drainage catheter. There continues to be soft tissue thickening around the catheter. There is no discrete fluid collection remaining in this area. No new chest wall fluid or abscess collections. Lungs/Pleura: Again noted is a small amount of irregular right pleural fluid and right pleural  thickening. The amount of right pleural fluid has slightly increased since the previous examination. There is no left pleural fluid. Trachea and mainstem bronchi are patent. There is stable volume loss in the right lung. Stable thickening along the right major fissure. Patchy ground-glass or airspace densities in left lung have decreased. There is a small amount of residual ground-glass disease in the left upper lung. Upper Abdomen: Small amount of contrast in the renal collecting system related to the timing of the contrast injection. Imaging was slightly delayed because of the contrast was hand injected rather than power injected. No acute abnormality in the upper abdomen. Musculoskeletal: Again noted is fracture of the anterior right fifth rib. IMPRESSION: The right chest wall fluid collections/abscess has resolved following placement of the drainage catheter. No significant fluid remaining around the catheter. Persistent soft tissue swelling along the right chest wall. Slightly increased amount of right pleural fluid with chronic changes in  the right lung. Decreased ground-glass or airspace densities in the left lung. This may represent resolving infection or inflammation. Electronically Signed   By: Markus Daft M.D.   On: 12/13/2016 17:42    Assessment/Plan: Bryan Jennings is 26 y.o. male with PMH of drug abuse, HTN, depression, recent empyema s/p VATS in Oct 2017 who presented with SOB and was found to have recurrent right chest wall abscess. Abscess was drained and culture was positive for MRSA. Patient had been on empiric Vancomycin and Zosyn since 2/3. Cr began to uptrend and nephrology was asked to see.   1.AKI: Patient without any underlying kidney disease. Cr 2.2 from baseline of 0.7-0.9. Suspect rise in Creatine related to offending medications. Has received IV contrast twice over the course of 4 days. Has also been receiving Vancomycin since 2/3. Was on scheduled Toradol for 4.5 days of this  hospitalization. Antibiotic has been switched to Doxycycline. Toradol has been discontinued by primary team. Will obtain UA. Anticipate renal function to improve after d/c of offending agents. Will f/u [facility] tomorrow and if improved, can likely discharge. Patient will need repeat labs 1 week after discharge.  2. Right Chest Wall Abscess: Doxycycline.  3. HTN: Stable. On Metoprolol.  4. Drug Abuse/Depression: Per Primary.    Melina Schools 12/14/2016, 3:00 PM   Renal Attending: He has toxic nephropathy which should improve quickly given his age and lack of comorbidities. Would like to see improvement or stabilization before discharge unless he can be followed closely by a physician. Kristen Bushway C

## 2016-12-15 LAB — RENAL FUNCTION PANEL
ALBUMIN: 2.6 g/dL — AB (ref 3.5–5.0)
ANION GAP: 13 (ref 5–15)
BUN: 6 mg/dL (ref 6–20)
CO2: 24 mmol/L (ref 22–32)
CREATININE: 2.1 mg/dL — AB (ref 0.61–1.24)
Calcium: 8.5 mg/dL — ABNORMAL LOW (ref 8.9–10.3)
Chloride: 107 mmol/L (ref 101–111)
GFR calc Af Amer: 48 mL/min — ABNORMAL LOW (ref >60)
GFR calc non Af Amer: 42 mL/min — ABNORMAL LOW (ref >60)
GLUCOSE: 94 mg/dL (ref 65–99)
PHOSPHORUS: 3.5 mg/dL (ref 2.5–4.6)
Potassium: 3.9 mmol/L (ref 3.5–5.1)
Sodium: 144 mmol/L (ref 135–145)

## 2016-12-15 LAB — AEROBIC/ANAEROBIC CULTURE W GRAM STAIN (SURGICAL/DEEP WOUND)

## 2016-12-15 LAB — AEROBIC/ANAEROBIC CULTURE (SURGICAL/DEEP WOUND)

## 2016-12-15 MED ORDER — DOXYCYCLINE HYCLATE 100 MG PO TABS: 100.0000 mg | ORAL_TABLET | Freq: Two times a day (BID) | ORAL | 0 refills | Status: DC

## 2016-12-15 MED ORDER — ALPRAZOLAM 0.25 MG PO TABS: 0.2500 mg | ORAL_TABLET | Freq: Two times a day (BID) | ORAL | 0 refills | Status: DC

## 2016-12-15 MED ORDER — GABAPENTIN 300 MG PO CAPS: 300.0000 mg | ORAL_CAPSULE | Freq: Three times a day (TID) | ORAL | 0 refills | Status: DC

## 2016-12-15 MED ORDER — ACETAMINOPHEN 325 MG PO TABS: 650.0000 mg | ORAL_TABLET | Freq: Four times a day (QID) | ORAL | Status: DC | PRN

## 2016-12-15 NOTE — Care Management Note (Signed)
Case Management Note  Patient Details  Name: Milford CageJames Brunsman MRN: [id number] Date of Birth: [birth year]  Subjective/Objective:  [facility] per our conversation he will call prior to visit and ask if BUN and CBC can be done there as [facility] rep, [facility] Pt verbalized understanding.  Clarified address and phone numbers on facesheet as correct.  [facility]            Action/Plan: CM will sign off for now but will be available should additional discharge needs arise or disposition change.    Expected Discharge Date:  12/15/16               Expected Discharge Plan:  Home/Self Care  In-House Referral:  NA  Discharge planning Services  CM Consult, Medication Assistance, Follow-up appt scheduled, [facility]  Post Acute Care Choice:  NA Choice offered to:  Patient  DME Arranged:  N/A DME Agency:  NA  HH Arranged:  NA HH Agency:  NA  Status of Service:  Completed, signed off  If discussed at Long Length of Stay Meetings, dates discussed:    Additional Comments:  Yvone NeuCrutchfield, Sharmin Foulk M, RN 12/15/2016, 1:30 PM

## 2016-12-15 NOTE — Progress Notes (Signed)
SCreat down to 2.1.  This should cont to improve if noxious agents avoided and infection resolves. He will need f/u creatinine to verify resolution in 1-2 weeks by his physician.  We will sign off. Bryan Jennings C

## 2016-12-15 NOTE — Discharge Summary (Addendum)
Physician Discharge Summary  Bryan Jennings AVW:[id number] DOB: [birth year]  PCP: No PCP Per Patient  Admit date: 12/09/2016 Discharge date: 12/15/2016  Recommendations for Outpatient Follow-up:  1. Dr. Tharon Aquas Trigt, TCTS on 12/19/16 at 4:30 PM with repeat chest x-ray. 2. Dr. Erling Cruz, Nephrology: Patient advised to call their office on Monday 12/17/16 for repeat labs in one week (CBC & BMP) and follow-up as needed. 3. Cammie Sickle, FNP/New PCP on 01/18/17 at 1:45 PM.   Home Health:  None Equipment/Devices:  None    Discharge Condition:  Improved and stable  CODE STATUS:  Full  Diet recommendation:  Heart healthy diet.  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[place])   Empyema lung (HCC)   SIRS (systemic inflammatory response syndrome) ([place])   Brief/Interim Summary: Bryan Jennings a 26 y.o.malewith a PMH of HTN, PTSD, depressive disorder, polysubstance abuse, heroin OD prior to previous hospitalization leading to bystander CPR, EMS resuscitation with Narcan, initial refusal to come to hospital, subsequent hospital admission in October 2017 when he underwent right VATS for MRSA empyema and right chest wall abscess drainage, transitioned to oral Zyvox at the time of discharge and completed 4 weeks treatment, denies IV drug abuse but admitted to using cocaine and methamphetamine in the 3 days prior to admission, presented to ED on 12/09/16 with complaints of right-sided chest pain, noted to be tachycardic in the 130s, hypoxic 89% on room air and CTA chest showed recurrent right chest wall abscess extending into the pleural space but separated by a fibrous capsule. TCTS consulted and at their recommendation, IR performed  percutaneous drainage and drain was placed on 12/09/16. Abscess drained confirms MRSA. Chest drain has been removed. Improved.   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 - TCTS was consulted and according to them, CT chest showed recurrent right chest wall abscess extending into the pleural space but separated by a fibrous capsule. Per their recommendation, IR did CT-guided percutaneous tube on 12/09/16.  - He was empirically placed on IV vancomycin and Zosyn.  - Abscess culture confirms MRSA.  - Clinically improved TCTS and IR continued to follow. Abscess improved by CT chest. Drain was removed 2/8. IR signed off. TCTS  has arranged outpatient follow-up early next week. - I discussed with infectious disease M.D. on call 2/8 who recommended transitioning to oral doxycycline at discharge for additional 2 weeks. TCTS can determine if he needs any further antibiotics during outpatient follow-up.  Community-acquired pneumonia/pneumonitis - CT chest 12/09/16 showed bronchial wall thickening and scattered groundglass opacities concerning for pneumonia. Has been on IV Zosyn and vancomycin since 2/3 and completed treatment for this indication.  Acute respiratory failure with hypoxia - Secondary to chest wall abscess and pneumonia. Treated as above. Resolved.  Sepsis - Patient met sepsis criteria on admission including tachycardia, leukocytosis and hypoxia. Source of sepsis included chest wall abscess and pneumonia. Sepsis resolved.  Essential hypertension -Continue home Lopressor. Reasonable control.   Polysubstance dependence including opioid type drug, episodic abuse  - Admitted using cocaine and methamphetamine in the past  3 days pta - denied alcohol or heroin use recently -  limit narcotics, apparently going to NA meetings for few months now  - No narcotics at discharge. Patient states that he lives in a "rehabilitation home". He stated that he has run out of gabapentin-filled a few days until outpatient follow-up. Patient also states that he has been on scheduled Xanax since October 2017 and states that Xanax and gabapentin are being filled up by his thoracic surgeon. Counseled him extensively that he cannot abruptly stop Xanax that he has been on for several months and this has to be gradually tapered to discontinue so that he does not develop benzodiazepine withdrawal complications including seizures. He states that he has run out of Xanax prescription which was filled for 5 days prior to admission by an urgent care. Prescribed a very short supply of benzodiazepines until outpatient follow-up with his prescribing M.D. (has appointment for early next week). He was also advised not to take Xanax and dry or drink alcohol or operate heavy machinery. He verbalized understanding.  PTSD (post-traumatic stress disorder)/Opioid-induced mood disorder  -Continue preadmission Xanax and Neurontin >see discussion above.  Acute kidney injury - Creatinine has increased from 0.88 on 2/6 to 1.22 on 12/12/16. Creatinine steadily increased to 2.26 on 2/9. Multifactorial secondary to vancomycin (level mildly elevated at 21), scheduled IV Toradol and IV contrast on 2/4 and 2/8. Discontinued vancomycin and Toradol. Avoid nephrotoxic medications or contrast. IV fluids. Follow BMP. Nephrology consultation appreciated. Discussed with nephrology today. Creatinine has improved slightly to 2.1. As per nephrology, expect complete recovery and cleared for discharge. Dr. Florene Glen has kindly agreed to follow-up with repeat BMP in 1 week. Patient was advised to call their office for appointment. Also counseled regarding avoiding nephrotoxic medications and agents.  Anemia - Stable. Periodic follow-up of CBCs  as outpatient.     Consultants:   CVTS  IR   Nephrology.   Procedures    right chest wall percutaneous drain placed by IR on 12/09/16. Discontinued.   Discharge Instructions  Discharge Instructions    Activity as tolerated - No restrictions    Complete by:  As directed    Call MD for:  difficulty breathing, headache or visual disturbances    Complete by:  As directed    Call MD for:  extreme fatigue    Complete by:  As directed    Call MD for:  persistant dizziness or light-headedness    Complete by:  As directed    Call MD for:  redness, tenderness, or signs of infection (pain, swelling, redness, odor or green/yellow discharge around incision site)    Complete by:  As directed    Call MD for:  severe uncontrolled pain    Complete by:  As directed    Call MD for:  temperature >100.4    Complete by:  As directed    Diet - low sodium heart healthy    Complete by:  As directed        Medication List    STOP taking these medications   Oxycodone HCl 10 MG Tabs     TAKE these medications   acetaminophen 325 MG tablet Commonly known as:  TYLENOL Take 2 tablets (650 mg total) by mouth every 6 (six) hours as needed for mild pain, moderate pain, fever or headache (or Fever >/= 101).   ALPRAZolam 0.25 MG tablet Commonly known as:  XANAX Take 1 tablet (0.25 mg total) by mouth 2 (two) times daily.  doxycycline 100 MG tablet Commonly known as:  VIBRA-TABS Take 1 tablet (100 mg total) by mouth 2 (two) times daily.   gabapentin 300 MG capsule Commonly known as:  NEURONTIN Take 1 capsule (300 mg total) by mouth 3 (three) times daily. For withdrawal related anxiety symptoms   metoprolol tartrate 25 MG tablet Commonly known as:  LOPRESSOR Take 0.5 tablets (12.5 mg total) by mouth 2 (two) times daily.      Follow-up [facility] Follow up on 01/18/2017.   Specialty:  Internal Medicine Why:  f/u appointment made for 2:00 pm to  etablish care. To be seen with repeat labs (CBC & BMP).  Contact information: [place] [address] [place], [place] Follow up in 2 week(s).   Specialty:  Cardiothoracic Surgery Why:  Office will contact you with appointment date and time... please get CXR 30 min prior to your appointment with Dr. Prescott Gum Contact information: [address] [place] [address] [phone]        Estanislado Emms, MD. Call on 12/17/2016.   Specialty:  Nephrology Why:  Call on 12/17/16 for an appointment for repeat labs in one week (CBC & BMP) and follow-up as needed. Contact information: [place] [address] [phone]          No Known Allergies   Procedures/Studies: Dg Chest 2 View  Result Date: 12/13/2016 CLINICAL DATA:  Right chest wall abscess EXAM: CHEST  2 VIEW COMPARISON:  PA and lateral chest x-ray of December 12, 2016 FINDINGS: There is persistent volume loss on the right. The small caliber right-sided chest tube is in stable position and may lie outside the pleural space. There is no pneumothorax. The right lung is clear. The heart and pulmonary vascularity are normal. IMPRESSION: Positioning of the small caliber pleural drainage catheter on the right is worrisome for at least a partially extrapleural position. Correlation as to the adequacy of the functioning of the tube is needed. Chest CT scanning could be employed to assess positioning of the tube. There is a stable appearance of the moderate size right pleural effusion. Electronically Signed   By: David  Martinique M.D.   On: 12/13/2016 08:08   Dg Chest 2 View  Result Date: 12/12/2016 CLINICAL DATA:  Right chest tube, right empyema EXAM: CHEST  2 VIEW COMPARISON:  CT 12/09/2016 and 12/10/2016. FINDINGS: Pigtail drainage catheter projects in the right chest wall lateral to the right sixth rib. Continued right pleural effusion  and right lower lobe opacity, atelectasis or infiltrate. Heart is normal size. Left lung is clear. IMPRESSION: Right chest wall pigtail drainage catheter in place. Continued right pleural effusion with right lower lobe atelectasis or infiltrate. Electronically Signed   By: Rolm Baptise M.D.   On: 12/12/2016 08:50   Dg Chest 2 View  Result Date: 12/09/2016 CLINICAL DATA:  RIGHT rib pain. Diaphoresis. History of VATS and empyema drainage/thoracotomy October 2017. EXAM: CHEST  2 VIEW COMPARISON:  Chest radiograph September 19, 2016 and CT chest September 03, 2016 FINDINGS: Dense consolidation RIGHT lung base with likely loculated moderate pleural effusion. Elevated RIGHT hemidiaphragm with juxta peaking consistent with sub pleural effusion. Bandlike densities RIGHT lung base. LEFT lung is clear. Cardiomediastinal silhouette  is normal. No pneumothorax. Soft tissue planes and included osseous structures are nonsuspicious. IMPRESSION: Increasing RIGHT effusion and consolidation concerning for recurrent empyema. RIGHT lung base atelectasis. Electronically Signed   By: Elon Alas M.D.   On: 12/09/2016 02:01   Ct Chest W Contrast  Result Date: 12/13/2016 CLINICAL DATA:  26 year old with history of empyema, thoracotomy and chest wall abscess. Follow-up extrapleural right chest drain. EXAM: CT CHEST WITH CONTRAST TECHNIQUE: Multidetector CT imaging of the chest was performed during intravenous contrast administration. CONTRAST:  32m ISOVUE-300 IOPAMIDOL (ISOVUE-300) INJECTION 61% COMPARISON:  12/09/2016 FINDINGS: Cardiovascular: Normal caliber of the thoracic aorta and pulmonary arteries. Mediastinum/Nodes: Again noted are numerous small lymph nodes scattered throughout the mediastinum and axillary regions. These nodes could be reactive in etiology. Esophagus is unremarkable. The right chest wall extrapleural abscess/fluid collection has essentially resolved following placement of the pigtail drainage catheter.  There continues to be soft tissue thickening around the catheter. There is no discrete fluid collection remaining in this area. No new chest wall fluid or abscess collections. Lungs/Pleura: Again noted is a small amount of irregular right pleural fluid and right pleural thickening. The amount of right pleural fluid has slightly increased since the previous examination. There is no left pleural fluid. Trachea and mainstem bronchi are patent. There is stable volume loss in the right lung. Stable thickening along the right major fissure. Patchy ground-glass or airspace densities in left lung have decreased. There is a small amount of residual ground-glass disease in the left upper lung. Upper Abdomen: Small amount of contrast in the renal collecting system related to the timing of the contrast injection. Imaging was slightly delayed because of the contrast was hand injected rather than power injected. No acute abnormality in the upper abdomen. Musculoskeletal: Again noted is fracture of the anterior right fifth rib. IMPRESSION: The right chest wall fluid collections/abscess has resolved following placement of the drainage catheter. No significant fluid remaining around the catheter. Persistent soft tissue swelling along the right chest wall. Slightly increased amount of right pleural fluid with chronic changes in the right lung. Decreased ground-glass or airspace densities in the left lung. This may represent resolving infection or inflammation. Electronically Signed   By: AMarkus DaftM.D.   On: 12/13/2016 17:42   Ct Chest W Contrast  Result Date: 12/09/2016 CLINICAL DATA:  Empyema.  History of substance abuse, thoracotomy. EXAM: CT CHEST WITH CONTRAST TECHNIQUE: Multidetector CT imaging of the chest was performed during intravenous contrast administration. CONTRAST:  778mISOVUE-300 IOPAMIDOL (ISOVUE-300) INJECTION 61% COMPARISON:  Chest radiograph December 09, 2016 at 0125 hours and CT chest September 03, 2016  FINDINGS: CARDIOVASCULAR: Heart and pericardium are unremarkable. Thoracic aorta is normal course and caliber, unremarkable. MEDIASTINUM/NODES: No mediastinal mass. Greater than expected number of prominent lymph nodes, largest lymph node measures up to 10 mm. 11 mm RIGHT supraclavicular fossa lymph node. LUNGS/PLEURA: Elevated RIGHT hemidiaphragm. RIGHT lateral pleural space rim enhancing fluid collection with chest wall invasion through the fourth through seventh intercostal spaces into the RIGHT chest wall muscles, collection measures 4.4 x 7.2 x 7.2 cm. Small RIGHT pleural effusion and pleural thickening. Patchy ground-glass opacities bilateral lung involving all lobes. RIGHT upper, RIGHT middle lobe and to lesser extent RIGHT lower lobe scarring. Bronchial wall thickening. No pneumothorax. UPPER ABDOMEN: Nonacute. MUSCULOSKELETAL: Healing nondisplaced RIGHT anterior fifth rib fracture, abscess surrounds. IMPRESSION: RIGHT chest wall 4.4 x 7.2 x 7.2 cm abscess extending from pleural space to the RIGHT chest wall muscles. Bronchial wall thickening  and scattered ground-glass opacities concerning for pneumonia. Borderline medial spinal lymphadenopathy is likely reactive. Electronically Signed   By: Elon Alas M.D.   On: 12/09/2016 06:02   Ct Image Guided Drainage By Percutaneous Catheter  Result Date: 12/11/2016 INDICATION: 26 year old male with a history of chest wall abscess EXAM: CT GUIDED DRAINAGE OF chest wall ABSCESS MEDICATIONS: The patient is currently admitted to the hospital and receiving intravenous antibiotics. The antibiotics were administered within an appropriate time frame prior to the initiation of the procedure. ANESTHESIA/SEDATION: 2.0 mg IV Versed 150 mcg IV Fentanyl Moderate Sedation Time:  30 The patient was continuously monitored during the procedure by the interventional radiology nurse under my direct supervision. COMPLICATIONS: None TECHNIQUE: Informed written consent was  obtained from the patient after a thorough discussion of the procedural risks, benefits and alternatives. All questions were addressed. Maximal Sterile Barrier Technique was utilized including caps, mask, sterile gowns, sterile gloves, sterile drape, hand hygiene and skin antiseptic. A timeout was performed prior to the initiation of the procedure. PROCEDURE: The operative field was prepped with Chlorhexidine in a sterile fashion, and a sterile drape was applied covering the operative field. A sterile gown and sterile gloves were used for the procedure. Local anesthesia was provided with 1% Lidocaine. Patient positioned supine position on CT gantry table. Scout CT was acquired for planning purposes. Once the patient is prepped and draped in the usual sterile fashion, the skin and subcutaneous tissues were generously infiltrated 1% lidocaine for local anesthesia. An 18 gauge guide needle was then advanced into hypo dense fluid collection of the right chest wall musculature. Modified Seldinger technique was then used to place a 12 French pigtail drainage catheter. Approximately 5 cc of purulent material was aspirated. Catheter was sutured in position attached to gravity drainage. Final image was acquired. Sample was sent to lab for analysis. Patient tolerated the procedure well and remained hemodynamically stable throughout. No complications were encountered and no significant blood loss. FINDINGS: Scout CT demonstrates hypoechoic region involving right chest wall musculature, similar to the comparison CT. Approximately 5 cc of purulent material aspirated. Final CT demonstrates pigtail catheter within the phlegmon of the right chest wall musculature. IMPRESSION: Status post CT-guided drain placement into phlegmon/ abscess of the right chest wall musculature. Sample sent to the lab for analysis. Signed, Dulcy Fanny. Earleen Newport, DO [facility] Electronically Signed    By: Corrie Mckusick D.O.   On: 12/11/2016 09:26      Subjective: Denies complaints. Eager to go home. No chest pain, cough, dyspnea, dizziness or lightheadedness reported.  Discharge Exam:  Vitals:   12/14/16 1500 12/14/16 2040 12/15/16 0018 12/15/16 0433  BP: (!) 144/76 (!) 146/77 137/70 (!) 156/66  Pulse: 90 69 61 70  Resp: 20 18  18   Temp: 98.4 F (36.9 C) 98.9 F (37.2 C)  98.3 F (36.8 C)  TempSrc: Oral Oral  Oral  SpO2:  97%  97%  Weight:      Height:        General exam: Appears calm and comfortable, well built and nourished male, no distress Respiratory system: Slightly diminished breath sounds in the right base but otherwise clear to auscultation. Right chest wall Previous drain site dressing clean and dry. No increased work of breathing.  Cardiovascular system: S1 & S2 heard, RRR.  Gastrointestinal system: Abdomen is nondistended, soft and nontender.  Normal bowel sounds heard. Central nervous system: Alert and oriented. No focal neurological deficits. Extremities: Symmetric 5  x 5 power. Skin: No rashes, lesions or ulcers Psychiatry: Judgement and insight appear normal. Mood & affect appropriate.     The results of significant diagnostics from this hospitalization (including imaging, microbiology, ancillary and laboratory) are listed below for reference.     Microbiology: Recent Results (from the past 240 hour(s))  MRSA PCR Screening     Status: None   Collection Time: 12/10/16  8:03 AM  Result Value Ref Range Status   MRSA by PCR NEGATIVE NEGATIVE Final    Comment:        The GeneXpert MRSA Assay (FDA approved for NASAL specimens only), is one component of a comprehensive MRSA colonization surveillance program. It is not intended to diagnose MRSA infection nor to guide or monitor treatment for MRSA infections.   Aerobic/Anaerobic Culture (surgical/deep wound)     Status: None (Preliminary result)   Collection Time: 12/10/16  5:42 PM  Result Value Ref  Range Status   Specimen Description ABSCESS CHEST  Final   Special Requests NONE  Final   Gram Stain   Final    MODERATE WBC PRESENT,BOTH PMN AND MONONUCLEAR RARE GRAM POSITIVE COCCI IN PAIRS    Culture   Final    FEW METHICILLIN RESISTANT STAPHYLOCOCCUS AUREUS NO ANAEROBES ISOLATED    Report Status PENDING  Incomplete   Organism ID, Bacteria METHICILLIN RESISTANT STAPHYLOCOCCUS AUREUS  Final      Susceptibility   Methicillin resistant staphylococcus aureus - MIC*    CIPROFLOXACIN >=8 RESISTANT Resistant     ERYTHROMYCIN <=0.25 SENSITIVE Sensitive     GENTAMICIN <=0.5 SENSITIVE Sensitive     OXACILLIN >=4 RESISTANT Resistant     TETRACYCLINE <=1 SENSITIVE Sensitive     VANCOMYCIN <=0.5 SENSITIVE Sensitive     TRIMETH/SULFA <=10 SENSITIVE Sensitive     CLINDAMYCIN <=0.25 SENSITIVE Sensitive     RIFAMPIN <=0.5 SENSITIVE Sensitive     Inducible Clindamycin NEGATIVE Sensitive     * FEW METHICILLIN RESISTANT STAPHYLOCOCCUS AUREUS     Labs: BNP (last 3 results)  Recent Labs  08/21/16 2023  BNP 45.3   Basic Metabolic Panel:  Recent Labs Lab 12/11/16 0301 12/12/16 1128 12/13/16 1646 12/14/16 0211 12/15/16 0544  NA 140 140 142 140 144  K 3.6 4.2 5.0 4.2 3.9  CL 106 110 110 108 107  CO2 24 22 18* 21* 24  GLUCOSE 127* 103* 95 89 94  BUN 11 10 10 8 6   CREATININE 0.88 1.22 2.07* 2.26* 2.10*  CALCIUM 7.9* 8.5* 8.6* 8.5* 8.5*  PHOS  --   --   --   --  3.5   Liver Function Tests:  Recent Labs Lab 12/10/16 0325 12/15/16 0544  AST 34  --   ALT 45  --   ALKPHOS 39  --   BILITOT 0.6  --   PROT 5.9*  --   ALBUMIN 2.5* 2.6*   CBC:  Recent Labs Lab 12/09/16 0113 12/10/16 0325 12/11/16 0301  WBC 13.1* 6.1 5.0  HGB 13.0 11.7* 11.5*  HCT 39.2 36.9* 35.9*  MCV 88.9 89.8 89.5  PLT 268 237 255   Cardiac Enzymes:  Recent Labs Lab 12/09/16 0113  TROPONINI <0.03   Urinalysis    Component Value Date/Time   COLORURINE STRAW (A) 12/14/2016 1901   APPEARANCEUR  CLEAR 12/14/2016 1901   LABSPEC 1.005 12/14/2016 1901   PHURINE 7.0 12/14/2016 1901   GLUCOSEU NEGATIVE 12/14/2016 1901   HGBUR NEGATIVE 12/14/2016 1901   BILIRUBINUR NEGATIVE 12/14/2016 1901  [place] NEGATIVE 12/14/2016 1901   PROTEINUR NEGATIVE 12/14/2016 1901   NITRITE NEGATIVE 12/14/2016 1901   LEUKOCYTESUR NEGATIVE 12/14/2016 1901   I have queried the [place] [facility] database and do not see his name on any recent controlled medications, but he states he filled Xanax at [facility] recently. [place] and he filled last Xanax prescription on 11/26/16 for 10 tabs.   Time coordinating discharge: Over 30 minutes  SIGNED:  Vernell Leep, MD, FACP, [place]. [facility] Pager [phone]  If 7PM-7AM, please contact night-coverage [email or web address] Password [email or web address] 12/15/2016, 12:45 PM

## 2016-12-19 ENCOUNTER — Encounter: Payer: Self-pay | Admitting: Cardiothoracic Surgery

## 2016-12-28 ENCOUNTER — Other Ambulatory Visit: Payer: Self-pay | Admitting: *Deleted

## 2016-12-28 DIAGNOSIS — J869 Pyothorax without fistula: Secondary | ICD-10-CM

## 2017-01-01 ENCOUNTER — Other Ambulatory Visit: Payer: Self-pay

## 2017-01-01 DIAGNOSIS — M792 Neuralgia and neuritis, unspecified: Secondary | ICD-10-CM

## 2017-01-01 MED ORDER — GABAPENTIN 300 MG PO CAPS: 300.0000 mg | ORAL_CAPSULE | Freq: Three times a day (TID) | ORAL | 0 refills | Status: DC

## 2017-01-01 NOTE — Telephone Encounter (Signed)
RX refill for Gabapentin sent to [facility] per patients request. "It's much cheeper than [facility]" He is scheduled to see Dr Donata ClayVan Trigt tomorrow 01/02/17

## 2017-01-02 ENCOUNTER — Ambulatory Visit: Payer: Self-pay | Admitting: Cardiothoracic Surgery

## 2017-01-02 ENCOUNTER — Ambulatory Visit (INDEPENDENT_AMBULATORY_CARE_PROVIDER_SITE_OTHER): Payer: Self-pay | Admitting: Cardiothoracic Surgery

## 2017-01-02 ENCOUNTER — Encounter: Payer: Self-pay | Admitting: Cardiothoracic Surgery

## 2017-01-02 ENCOUNTER — Ambulatory Visit
Admission: RE | Admit: 2017-01-02 | Discharge: 2017-01-02 | Disposition: A | Discharge status: Home / Self Care | Payer: No Typology Code available for payment source | Source: Ambulatory Visit | Attending: Cardiothoracic Surgery | Admitting: Cardiothoracic Surgery

## 2017-01-02 VITALS — BP 148/99 | HR 99 | Resp 16 | Ht 70.0 in | Wt 254.4 lb

## 2017-01-02 DIAGNOSIS — M792 Neuralgia and neuritis, unspecified: Secondary | ICD-10-CM

## 2017-01-02 DIAGNOSIS — J869 Pyothorax without fistula: Secondary | ICD-10-CM

## 2017-01-02 DIAGNOSIS — Z09 Encounter for follow-up examination after completed treatment for conditions other than malignant neoplasm: Secondary | ICD-10-CM

## 2017-01-02 MED ORDER — METOPROLOL TARTRATE 25 MG PO TABS: 12.5000 mg | ORAL_TABLET | Freq: Two times a day (BID) | ORAL | 1 refills | Status: DC

## 2017-01-02 MED ORDER — GABAPENTIN 300 MG PO CAPS: 300.0000 mg | ORAL_CAPSULE | Freq: Three times a day (TID) | ORAL | 0 refills | Status: DC

## 2017-01-02 NOTE — Progress Notes (Signed)
[address]       [place] [id number]             [phone]                  Bryan CageJames Jennings [facility] Medical Record #[id number] Date of Birth: 08-Aug-1991  Referring OZ:HYQMVD:Silva Alinda DoomsZapata, Edwin, MD Primary Cardiology: Primary Care:No PCP Per Patient  Chief Complaint:  Follow Up Visit DATE OF PROCEDURE:  08/29/2016 DATE OF DISCHARGE:                              OPERATIVE REPORT   OPERATION:  Right video-assisted thoracoscopic surgery - mini thoracotomy for drainage of empyema and decortication of right lower lobe.  PREOPERATIVE DIAGNOSIS:  Right empyema, entrapped right lower lobe.  POSTOPERATIVE DIAGNOSIS:  Right empyema, entrapped right lower lobe.  SURGEON:  Kerin PernaPeter Van Trigt, M.D.  ASSISTANT:  Jari Favreessa Conte, PA-C.  ANESTHESIA:  General by Dr. Blima DessertBen Judd.     History of Present Illness: The patient is a 26 year old male status post the above described procedure. He was seen in the office on today's date in routine follow-up. He reports these making some steady progress with his recovery. He will a basketball the other day and had no significant issues with shortness of breath. He does have some soreness associated with the incision and chest tube sites. It is relatively mild and appears to be tolerable. He has some ongoing medical issues and is scheduled to see primary care in mid March. He denies fevers, chills or other constitutional symptoms. He has had no difficulties with his incisions.     Zubrod Score: At the time of surgery this patient's most appropriate activity status/level should be described as: []     0    Normal activity, no symptoms []     1    Restricted in physical strenuous activity but ambulatory, able to do out light work []     2    Ambulatory and capable of self care, unable to do work activities, up and about                 >50 % of waking hours                                                                                    []     3    Only limited self care, in bed greater than 50% of waking hours []     4    Completely disabled, no self care, confined to bed or chair []     5    Moribund  History  Smoking Status  . Current Every Day Smoker  . Packs/day: 1.00  . Years: 12.00  . Types: Cigarettes  Smokeless Tobacco  . Never Used       No Known Allergies  Current Outpatient Prescriptions  Medication Sig Dispense Refill  . acetaminophen (TYLENOL) 325 MG tablet Take 2 tablets (650 mg total) by mouth every 6 (six) hours as needed for mild pain, moderate pain, fever or headache (or Fever >/= 101).    .Marland Kitchen  doxycycline (VIBRA-TABS) 100 MG tablet Take 1 tablet (100 mg total) by mouth 2 (two) times daily. 24 tablet 0  . gabapentin (NEURONTIN) 300 MG capsule Take 1 capsule (300 mg total) by mouth 3 (three) times daily. 90 capsule 0  . ALPRAZolam (XANAX) 0.25 MG tablet Take 1 tablet (0.25 mg total) by mouth 2 (two) times daily. (Patient not taking: Reported on 01/02/2017) 10 tablet 0  . metoprolol tartrate (LOPRESSOR) 25 MG tablet Take 0.5 tablets (12.5 mg total) by mouth 2 (two) times daily. 30 tablet 1   No current facility-administered medications for this visit.        Physical Exam: BP (!) 148/99 (BP Location: Left Arm, Patient Position: Sitting, Cuff Size: Large)   Pulse 99   Resp 16   Ht 5\' 10"  (1.778 m)   Wt 254 lb 6.4 oz (115.4 kg)   SpO2 97% Comment: ON RA  BMI 36.50 kg/m   General appearance: alert, cooperative and no distress Heart: regular rate and rhythm Lungs: Diminished in the right base Wound: Incisions all well-healed without evidence of infection.  Diagnostic Studies & Laboratory data:         Recent Radiology Findings: Dg Chest 2 View  Result Date: 01/02/2017 CLINICAL DATA:  Empyema, status post right thoracotomy, right-sided chest pain EXAM: CHEST  2 VIEW COMPARISON:  CT chest 12/13/2016 FINDINGS: There is blunting of the right costophrenic angle likely reflecting a small  amount of loculated pleural fluid versus scarring. There is mild elevation of the right diaphragm. The left lung is clear. There is no pleural effusion or pneumothorax. The heart and mediastinal contours are unremarkable. The osseous structures are unremarkable. IMPRESSION: Blunting of the right costophrenic angle likely reflecting a small amount of loculated pleural fluid versus scarring. Electronically Signed   By: Elige Ko   On: 01/02/2017 11:55      I have independently reviewed the above radiology findings and reviewed findings  with the patient.  Recent Labs: Lab Results  Component Value Date   WBC 5.0 12/11/2016   HGB 11.5 (L) 12/11/2016   HCT 35.9 (L) 12/11/2016   PLT 255 12/11/2016   GLUCOSE 94 12/15/2016   ALT 45 12/10/2016   AST 34 12/10/2016   NA 144 12/15/2016   K 3.9 12/15/2016   CL 107 12/15/2016   CREATININE 2.10 (H) 12/15/2016   BUN 6 12/15/2016   CO2 24 12/15/2016   INR 1.06 12/10/2016   HGBA1C 5.8 (H) 08/29/2016      Assessment / Plan:  Overall the patient is making steady progress in his recovery. His chest x-ray shows some findings consistent with inflammation and scarring that may or may not be a permanent finding. He does not appear to have any evidence of significant current respiratory issues. He does have some hypertension so I'll renew his Lopressor prescription which should get him covered until he sees primary care. Additionally I renewed his gabapentin prescription. We will see again in the office on a when necessary basis for any surgically related issues or at request.       GOLD,WAYNE E 01/02/2017 12:41 PM

## 2017-01-02 NOTE — Patient Instructions (Signed)
Verbal instructions given regarding advancing activities over time

## 2017-01-18 ENCOUNTER — Ambulatory Visit: Payer: Self-pay | Admitting: Family Medicine

## 2017-01-28 ENCOUNTER — Other Ambulatory Visit: Payer: Self-pay | Admitting: *Deleted

## 2017-01-28 DIAGNOSIS — M792 Neuralgia and neuritis, unspecified: Secondary | ICD-10-CM

## 2017-01-28 MED ORDER — GABAPENTIN 300 MG PO CAPS: 300.0000 mg | ORAL_CAPSULE | Freq: Three times a day (TID) | ORAL | 1 refills | Status: DC

## 2017-02-21 ENCOUNTER — Other Ambulatory Visit: Payer: Self-pay | Admitting: *Deleted

## 2017-02-21 DIAGNOSIS — M792 Neuralgia and neuritis, unspecified: Secondary | ICD-10-CM

## 2017-02-21 MED ORDER — GABAPENTIN 300 MG PO CAPS: 300.0000 mg | ORAL_CAPSULE | Freq: Three times a day (TID) | ORAL | 5 refills | Status: DC

## 2017-06-26 ENCOUNTER — Emergency Department (HOSPITAL_COMMUNITY): Payer: Self-pay

## 2017-06-26 ENCOUNTER — Encounter (HOSPITAL_COMMUNITY): Payer: Self-pay | Admitting: Emergency Medicine

## 2017-06-26 ENCOUNTER — Inpatient Hospital Stay (HOSPITAL_COMMUNITY)
Admission: EM | Admit: 2017-06-26 | Discharge: 2017-07-11 | DRG: 871 | Disposition: A | Discharge status: Home / Self Care | Payer: Self-pay | Attending: Internal Medicine | Admitting: Internal Medicine

## 2017-06-26 DIAGNOSIS — B182 Chronic viral hepatitis C: Secondary | ICD-10-CM | POA: Diagnosis present

## 2017-06-26 DIAGNOSIS — A419 Sepsis, unspecified organism: Secondary | ICD-10-CM

## 2017-06-26 DIAGNOSIS — F1123 Opioid dependence with withdrawal: Secondary | ICD-10-CM

## 2017-06-26 DIAGNOSIS — R6520 Severe sepsis without septic shock: Secondary | ICD-10-CM | POA: Diagnosis present

## 2017-06-26 DIAGNOSIS — I2690 Septic pulmonary embolism without acute cor pulmonale: Secondary | ICD-10-CM | POA: Diagnosis not present

## 2017-06-26 DIAGNOSIS — F1910 Other psychoactive substance abuse, uncomplicated: Secondary | ICD-10-CM | POA: Diagnosis present

## 2017-06-26 DIAGNOSIS — I4581 Long QT syndrome: Secondary | ICD-10-CM | POA: Diagnosis present

## 2017-06-26 DIAGNOSIS — B9561 Methicillin susceptible Staphylococcus aureus infection as the cause of diseases classified elsewhere: Secondary | ICD-10-CM

## 2017-06-26 DIAGNOSIS — R7881 Bacteremia: Secondary | ICD-10-CM

## 2017-06-26 DIAGNOSIS — A4101 Sepsis due to Methicillin susceptible Staphylococcus aureus: Principal | ICD-10-CM | POA: Diagnosis present

## 2017-06-26 DIAGNOSIS — F1920 Other psychoactive substance dependence, uncomplicated: Secondary | ICD-10-CM

## 2017-06-26 DIAGNOSIS — F419 Anxiety disorder, unspecified: Secondary | ICD-10-CM | POA: Diagnosis present

## 2017-06-26 DIAGNOSIS — F17210 Nicotine dependence, cigarettes, uncomplicated: Secondary | ICD-10-CM | POA: Diagnosis present

## 2017-06-26 DIAGNOSIS — I10 Essential (primary) hypertension: Secondary | ICD-10-CM | POA: Diagnosis present

## 2017-06-26 DIAGNOSIS — E876 Hypokalemia: Secondary | ICD-10-CM | POA: Diagnosis present

## 2017-06-26 DIAGNOSIS — M792 Neuralgia and neuritis, unspecified: Secondary | ICD-10-CM

## 2017-06-26 DIAGNOSIS — I071 Rheumatic tricuspid insufficiency: Secondary | ICD-10-CM

## 2017-06-26 DIAGNOSIS — F1120 Opioid dependence, uncomplicated: Secondary | ICD-10-CM | POA: Diagnosis present

## 2017-06-26 LAB — CBC WITH DIFFERENTIAL/PLATELET
Basophils Absolute: 0 10*3/uL (ref 0.0–0.1)
Basophils Relative: 0 %
EOS ABS: 0 10*3/uL (ref 0.0–0.7)
Eosinophils Relative: 0 %
HEMATOCRIT: 39.7 % (ref 39.0–52.0)
HEMOGLOBIN: 14.1 g/dL (ref 13.0–17.0)
LYMPHS ABS: 0.6 10*3/uL — AB (ref 0.7–4.0)
LYMPHS PCT: 5 %
MCH: 29.5 pg (ref 26.0–34.0)
MCHC: 35.5 g/dL (ref 30.0–36.0)
MCV: 83.1 fL (ref 78.0–100.0)
MONOS PCT: 9 %
Monocytes Absolute: 1 10*3/uL (ref 0.1–1.0)
NEUTROS ABS: 9.8 10*3/uL — AB (ref 1.7–7.7)
NEUTROS PCT: 86 %
Platelets: 180 10*3/uL (ref 150–400)
RBC: 4.78 MIL/uL (ref 4.22–5.81)
RDW: 12.3 % (ref 11.5–15.5)
WBC: 11.4 10*3/uL — AB (ref 4.0–10.5)

## 2017-06-26 LAB — URINALYSIS, ROUTINE W REFLEX MICROSCOPIC
Bacteria, UA: NONE SEEN
Bilirubin Urine: NEGATIVE
GLUCOSE, UA: NEGATIVE mg/dL
Ketones, ur: NEGATIVE mg/dL
Leukocytes, UA: NEGATIVE
NITRITE: NEGATIVE
PH: 6 (ref 5.0–8.0)
PROTEIN: 30 mg/dL — AB
Specific Gravity, Urine: 1.02 (ref 1.005–1.030)
Squamous Epithelial / LPF: NONE SEEN

## 2017-06-26 LAB — COMPREHENSIVE METABOLIC PANEL
ALBUMIN: 3.6 g/dL (ref 3.5–5.0)
ALT: 36 U/L (ref 17–63)
ANION GAP: 13 (ref 5–15)
AST: 49 U/L — ABNORMAL HIGH (ref 15–41)
Alkaline Phosphatase: 65 U/L (ref 38–126)
BILIRUBIN TOTAL: 1.8 mg/dL — AB (ref 0.3–1.2)
BUN: 12 mg/dL (ref 6–20)
CO2: 22 mmol/L (ref 22–32)
Calcium: 9.1 mg/dL (ref 8.9–10.3)
Chloride: 97 mmol/L — ABNORMAL LOW (ref 101–111)
Creatinine, Ser: 0.95 mg/dL (ref 0.61–1.24)
GFR calc Af Amer: >60 mL/min (ref >60)
GFR calc non Af Amer: >60 mL/min (ref >60)
GLUCOSE: 158 mg/dL — AB (ref 65–99)
POTASSIUM: 4.5 mmol/L (ref 3.5–5.1)
SODIUM: 132 mmol/L — AB (ref 135–145)
TOTAL PROTEIN: 7.8 g/dL (ref 6.5–8.1)

## 2017-06-26 LAB — PROTIME-INR
INR: 1.05
Prothrombin Time: 13.7 seconds (ref 11.4–15.2)

## 2017-06-26 LAB — RAPID URINE DRUG SCREEN, HOSP PERFORMED
Amphetamines: POSITIVE — AB
BENZODIAZEPINES: POSITIVE — AB
Barbiturates: NOT DETECTED
Cocaine: NOT DETECTED
OPIATES: POSITIVE — AB
Tetrahydrocannabinol: POSITIVE — AB

## 2017-06-26 LAB — POCT I-STAT TROPONIN I: TROPONIN I, POC: 0 ng/mL (ref 0.00–0.08)

## 2017-06-26 LAB — CG4 I-STAT (LACTIC ACID)
Lactic Acid, Venous: 3.66 mmol/L (ref 0.5–1.9)
Lactic Acid, Venous: 3.19 mmol/L (ref 0.5–1.9)

## 2017-06-26 MED ORDER — VANCOMYCIN HCL IN DEXTROSE 1-5 GM/200ML-% IV SOLN: 1000.0000 mg | Freq: Once | INTRAVENOUS | Status: DC

## 2017-06-26 MED ORDER — VANCOMYCIN HCL 10 G IV SOLR
2000.0000 mg | Freq: Once | INTRAVENOUS | Status: AC
  Administered 2017-06-26: 2000 mg via INTRAVENOUS
  Filled 2017-06-26: qty 2000

## 2017-06-26 MED ORDER — MORPHINE SULFATE (PF) 2 MG/ML IV SOLN
2.0000 mg | INTRAVENOUS | Status: DC | PRN
  Administered 2017-06-27 (×2): 2 mg via INTRAVENOUS
  Filled 2017-06-26: qty 1

## 2017-06-26 MED ORDER — SODIUM CHLORIDE 0.9 % IV BOLUS (SEPSIS)
1000.0000 mL | Freq: Once | INTRAVENOUS | Status: AC
  Administered 2017-06-26: 1000 mL via INTRAVENOUS

## 2017-06-26 MED ORDER — ENOXAPARIN SODIUM 40 MG/0.4ML ~~LOC~~ SOLN
40.0000 mg | Freq: Every day | SUBCUTANEOUS | Status: DC
  Administered 2017-06-27 – 2017-07-10 (×15): 40 mg via SUBCUTANEOUS
  Filled 2017-06-26 (×14): qty 0.4

## 2017-06-26 MED ORDER — VANCOMYCIN HCL IN DEXTROSE 1-5 GM/200ML-% IV SOLN
1000.0000 mg | Freq: Three times a day (TID) | INTRAVENOUS | Status: DC
  Administered 2017-06-27: 1000 mg via INTRAVENOUS
  Filled 2017-06-26: qty 200

## 2017-06-26 MED ORDER — LORAZEPAM 2 MG/ML IJ SOLN
1.0000 mg | INTRAMUSCULAR | Status: DC | PRN
  Administered 2017-06-27 (×4): 1 mg via INTRAVENOUS
  Filled 2017-06-26 (×4): qty 1

## 2017-06-26 MED ORDER — SODIUM CHLORIDE 0.9 % IV BOLUS (SEPSIS)
500.0000 mL | Freq: Once | INTRAVENOUS | Status: AC
  Administered 2017-06-26: 500 mL via INTRAVENOUS

## 2017-06-26 MED ORDER — PIPERACILLIN-TAZOBACTAM 3.375 G IVPB 30 MIN
3.3750 g | Freq: Once | INTRAVENOUS | Status: AC
  Administered 2017-06-26: 3.375 g via INTRAVENOUS
  Filled 2017-06-26: qty 50

## 2017-06-26 MED ORDER — PIPERACILLIN-TAZOBACTAM 3.375 G IVPB
3.3750 g | Freq: Three times a day (TID) | INTRAVENOUS | Status: DC
  Administered 2017-06-27 (×2): 3.375 g via INTRAVENOUS
  Filled 2017-06-26 (×3): qty 50

## 2017-06-26 MED ORDER — BACLOFEN 10 MG PO TABS: 10.0000 mg | ORAL_TABLET | Freq: Four times a day (QID) | ORAL | Status: DC | PRN

## 2017-06-26 MED ORDER — SODIUM CHLORIDE 0.9 % IV SOLN
INTRAVENOUS | Status: DC
  Administered 2017-06-27 – 2017-06-28 (×3): via INTRAVENOUS
  Administered 2017-06-29: 150 mL/h via INTRAVENOUS
  Administered 2017-06-29: 16:00:00 via INTRAVENOUS
  Administered 2017-06-30: 150 mL/h via INTRAVENOUS

## 2017-06-26 NOTE — ED Notes (Signed)
Pls call Byrd Hesselbach, RN for report at [phone] at [phone]. Thanks

## 2017-06-26 NOTE — ED Notes (Signed)
Main lab called to attempt blood draw  

## 2017-06-26 NOTE — ED Notes (Signed)
Pt has a lactic acid of 3.19 EDP Cadarma made aware.

## 2017-06-26 NOTE — ED Notes (Signed)
Lab called and stated that the lavender clotted. Will alert IV team to draw if able when starting IV.

## 2017-06-26 NOTE — ED Notes (Signed)
Attempted to collect blood culture, unable to get blood return. EDP made aware.

## 2017-06-26 NOTE — ED Triage Notes (Signed)
Patient here with complaints of weakness, fatigue, fever, SOB x4 days. States that he has been unable to get to the Methadone clinic in 4 days due to weakness. Reports substance abuse IV 2 weeks ago.

## 2017-06-26 NOTE — ED Notes (Addendum)
Main lab contacted to draw Lactic and Blood culture. EDP notified.

## 2017-06-26 NOTE — ED Notes (Signed)
Attempted to pull repeat lactic. Unable to get blood return and sticks unsuccessful.

## 2017-06-26 NOTE — ED Provider Notes (Signed)
[facility]-EMERGENCY DEPT Provider Note   CSN: [id number] Arrival date & time: 06/26/17  1801     History   Chief Complaint Chief Complaint  Patient presents with  . Fever  . Tachycardia  . Hypertension    HPI Bryan Jennings is a 26 y.o. male.  HPI Patient, with past medical history of IV drug use recently, presents to ED for evaluation of fever, tachycardia, weakness, chest discomfort, shortness of breath for the past 4 days. States his most recent IV drug use was one week ago. He has been using methadone but has been unable to get to the methadone clinic since symptoms began. He also reports generalized abdominal pain. He denies vomiting, urinary symptoms, hemoptysis, numbness, falls, injuries.  Past Medical History:  Diagnosis Date  . Hypertension   . Substance abuse     Patient Active Problem List   Diagnosis Date Noted  . Acute respiratory failure with hypoxia (HCC) 12/09/2016  . SIRS (systemic inflammatory response syndrome) (HCC) 12/09/2016  . Empyema lung (HCC)   . Pleural effusion on right   . Sepsis (HCC) 08/22/2016  . Essential hypertension 08/22/2016  . Opioid-induced mood disorder (HCC)   . Community acquired pneumonia 08/21/2016  . S/P alcohol detoxification 11/04/2013  . Polysubstance dependence including opioid type drug, episodic abuse (HCC) 11/04/2013  . PTSD (post-traumatic stress disorder) 11/04/2013  . Depressive disorder, not elsewhere classified 11/04/2013    Past Surgical History:  Procedure Laterality Date  . APPENDECTOMY    . THORACOTOMY Right 08/29/2016   Procedure: THORACOTOMY MAJOR;  Surgeon: Kerin Perna, MD;  Location: [facility] OR;  Service: Thoracic;  Laterality: Right;  Marland Kitchen VIDEO ASSISTED THORACOSCOPY (VATS)/EMPYEMA Right 08/29/2016   Procedure: VIDEO ASSISTED THORACOSCOPY (VATS)/ DRAINAGE EMPYEMA;  Surgeon: Kerin Perna, MD;  Location: [facility] OR;  Service: Thoracic;  Laterality: Right;       Home Medications    Prior to Admission  medications   Medication Sig Start Date End Date Taking? Authorizing Provider  acetaminophen (TYLENOL) 325 MG tablet Take 650 mg by mouth every 6 (six) hours as needed for mild pain, moderate pain, fever or headache.   Yes [provider]  methadone (DOLOPHINE) 10 MG tablet Take 10 mg by mouth daily.   Yes [provider]  acetaminophen (TYLENOL) 325 MG tablet Take 2 tablets (650 mg total) by mouth every 6 (six) hours as needed for mild pain, moderate pain, fever or headache (or Fever >/= 101). Patient not taking: Reported on 06/26/2017 12/15/16   Elease Etienne, MD  ALPRAZolam Prudy Feeler) 0.25 MG tablet Take 1 tablet (0.25 mg total) by mouth 2 (two) times daily. Patient not taking: Reported on 01/02/2017 12/15/16   Elease Etienne, MD  doxycycline (VIBRA-TABS) 100 MG tablet Take 1 tablet (100 mg total) by mouth 2 (two) times daily. Patient not taking: Reported on 06/26/2017 12/15/16   Elease Etienne, MD  gabapentin (NEURONTIN) 300 MG capsule Take 1 capsule (300 mg total) by mouth 3 (three) times daily. Patient not taking: Reported on 06/26/2017 02/21/17   Gershon Crane E, PA-C  metoprolol tartrate (LOPRESSOR) 25 MG tablet Take 0.5 tablets (12.5 mg total) by mouth 2 (two) times daily. Patient not taking: Reported on 06/26/2017 01/02/17   Rowe Clack, PA-C    Family History Family History  Problem Relation Age of Onset  . Diabetes Mother   . Hypertension Mother   . CAD Father 72    Social History Social History  Substance Use Topics  .  Smoking status: Current Every Day Smoker    Packs/day: 1.00    Years: 12.00    Types: Cigarettes  . Smokeless tobacco: Never Used  . Alcohol use Yes     Comment: etoh abuse - reports unsure when he last used, "I just don't like it"     Allergies   Patient has no known allergies.   Review of Systems Review of Systems  Constitutional: Positive for chills, diaphoresis and fever. Negative for appetite change.  HENT: Negative for ear  pain, rhinorrhea, sneezing and sore throat.   Eyes: Negative for photophobia and visual disturbance.  Respiratory: Positive for shortness of breath. Negative for cough, chest tightness and wheezing.   Cardiovascular: Positive for chest pain. Negative for palpitations.  Gastrointestinal: Positive for abdominal pain. Negative for blood in stool, constipation, diarrhea, nausea and vomiting.  Genitourinary: Negative for dysuria, hematuria and urgency.  Musculoskeletal: Negative for myalgias.  Skin: Negative for rash.  Neurological: Positive for headaches. Negative for dizziness, weakness and light-headedness.     Physical Exam Updated Vital Signs BP 114/62   Pulse 75   Temp (!) 102.8 F (39.3 C) (Oral) Comment: rn aware  Resp (!) 35   Ht 5\' 11"  (1.803 m)   Wt 108.9 kg (240 lb)   SpO2 96%   BMI 33.47 kg/m   Physical Exam  Constitutional: He appears well-developed and well-nourished. He appears distressed.  Appears acutely ill.  HENT:  Head: Normocephalic and atraumatic.  Nose: Nose normal.  Eyes: Conjunctivae and EOM are normal. Right eye exhibits no discharge. Left eye exhibits no discharge. No scleral icterus.  Neck: Normal range of motion. Neck supple.  Cardiovascular: Regular rhythm, normal heart sounds and intact distal pulses.  Tachycardia present.  Exam reveals no gallop and no friction rub.   No murmur heard. Pulmonary/Chest: Effort normal and breath sounds normal. No respiratory distress. He exhibits bony tenderness.    Abdominal: Soft. Bowel sounds are normal. He exhibits no distension. There is tenderness (Generalized). There is no rebound and no guarding.  Musculoskeletal: Normal range of motion. He exhibits no edema.  Neurological: He is alert. He exhibits normal muscle tone. Coordination normal.  Decreased grip strength bilaterally.  Skin: Skin is warm. No rash noted. He is diaphoretic.  Psychiatric: He has a normal mood and affect.  Nursing note and vitals  reviewed.    ED Treatments / Results  Labs (all labs ordered are listed, but only abnormal results are displayed) Labs Reviewed  COMPREHENSIVE METABOLIC PANEL - Abnormal; Notable for the following:       Result Value   Sodium 132 (*)    Chloride 97 (*)    Glucose, Bld 158 (*)    AST 49 (*)    Total Bilirubin 1.8 (*)    All other components within normal limits  CBC WITH DIFFERENTIAL/PLATELET - Abnormal; Notable for the following:    WBC 11.4 (*)    Neutro Abs 9.8 (*)    Lymphs Abs 0.6 (*)    All other components within normal limits  CG4 I-STAT (LACTIC ACID) - Abnormal; Notable for the following:    Lactic Acid, Venous 3.66 (*)    All other components within normal limits  CG4 I-STAT (LACTIC ACID) - Abnormal; Notable for the following:    Lactic Acid, Venous 3.19 (*)    All other components within normal limits  CULTURE, BLOOD (ROUTINE X 2)  CULTURE, BLOOD (ROUTINE X 2)  CULTURE, BLOOD (SINGLE)  PROTIME-INR  CBC WITH  DIFFERENTIAL/PLATELET  URINALYSIS, ROUTINE W REFLEX MICROSCOPIC  RAPID URINE DRUG SCREEN, HOSP PERFORMED  I-STAT CG4 LACTIC ACID, ED  I-STAT CG4 LACTIC ACID, ED  I-STAT TROPONIN, ED  POCT I-STAT TROPONIN I  I-STAT CG4 LACTIC ACID, ED  I-STAT CG4 LACTIC ACID, ED    EKG  EKG Interpretation None       Radiology Dg Chest 2 View  Result Date: 06/26/2017 CLINICAL DATA:  Shortness of breath, fever. EXAM: CHEST  2 VIEW COMPARISON:  None. FINDINGS: The heart size and mediastinal contours are within normal limits. No pneumothorax or pleural effusion is noted. Left lung is clear. Mild right basilar subsegmental atelectasis is noted. The visualized skeletal structures are unremarkable. IMPRESSION: Mild right basilar subsegmental atelectasis. Electronically Signed   By: Lupita Raider, M.D.   On: 06/26/2017 21:10    Procedures Procedures (including critical care time) CRITICAL CARE Performed by: Dietrich Pates   Total critical care time: 35  minutes  Critical care time was exclusive of separately billable procedures and treating other patients.  Critical care was necessary to treat or prevent imminent or life-threatening deterioration.  Critical care was time spent personally by me on the following activities: development of treatment plan with patient and/or surrogate as well as nursing, discussions with consultants, evaluation of patient's response to treatment, examination of patient, obtaining history from patient or surrogate, ordering and performing treatments and interventions, ordering and review of laboratory studies, ordering and review of radiographic studies, pulse oximetry and re-evaluation of patient's condition.   Medications Ordered in ED Medications  vancomycin (VANCOCIN) 2,000 mg in sodium chloride 0.9 % 500 mL IVPB (2,000 mg Intravenous New Bag/Given 06/26/17 2113)  piperacillin-tazobactam (ZOSYN) IVPB 3.375 g (0 g Intravenous Stopped 06/26/17 2050)  sodium chloride 0.9 % bolus 1,000 mL (0 mLs Intravenous Stopped 06/26/17 2113)    And  sodium chloride 0.9 % bolus 1,000 mL (1,000 mLs Intravenous New Bag/Given 06/26/17 1955)    And  sodium chloride 0.9 % bolus 1,000 mL (1,000 mLs Intravenous New Bag/Given 06/26/17 2108)    And  sodium chloride 0.9 % bolus 500 mL (500 mLs Intravenous New Bag/Given 06/26/17 2114)     Initial Impression / Assessment and Plan / ED Course  I have reviewed the triage vital signs and the nursing notes.  Pertinent labs & imaging results that were available during my care of the patient were reviewed by me and considered in my medical decision making (see chart for details).     Patient, with a past medical history of IV drug use with last heroine use 2 days ago, presents to ED for evaluation of generalized weakness, fever, tachycardia, chest discomfort and shortness of breath for the past 4 days. He states that he is unable to go to his methadone clinic for the past 4 days due to  symptoms. 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 He denies cough, hemoptysis, numbness or injuries. On physical exam patient is tachycardic on initial evaluation. Febrile to 102.8. Initial lactate 3.66. Troponin negative 1. No murmur noted. Blood cultures were obtained 3. Sepsis protocol initiated and patient was given faint, Zosyn and fluids. Chest x-ray negative. Urinalysis pending. Unknown source of infection but will need to be admitted for further management, workup and possibility of endocarditis. We'll speak to hospitalist for further management of this patient. Appreciate the help of hospitalist for management of this patient.  Final Clinical Impressions(s) /  ED Diagnoses   Final diagnoses:  None    New Prescriptions New Prescriptions   No medications on file     Dietrich Pates, Cordelia Poche 06/26/17 2206    Nira Conn, MD 06/26/17 [id number]

## 2017-06-26 NOTE — H&P (Addendum)
History and Physical  Aquila Delaughter UJW:[id number] DOB: [birth year] DOA: 06/26/2017  PCP:  Patient, No Pcp Per   Chief Complaint:  Aches/fever/chills  History of Present Illness:  Pt is a 26 yo male with hx of IVDA(heroin) who came with cc of fever/chills/diffuse body aches for the past 4 days. He last used heroin 8 days ago then started on methadone  but missed the last 4 days and believes he started going through withdrawal. He denied cough/chest pain but had tachypnea. No N/V/D/C/abd pain/dysuria. No skin wounds/joint swelling/neck stiffness/confusion. No other complaints.   Review of Systems:  CONSTITUTIONAL:     No night sweats.  No fatigue.  +fever. +chills. Eyes:                            No visual changes.  No eye pain.  No eye discharge.   ENT:                              No epistaxis.  No sinus pain.  No sore throat.   No congestion. RESPIRATORY:           No cough.  No wheeze.  No hemoptysis.  +dyspnea CARDIOVASCULAR   :  No chest pains.  No palpitations. GASTROINTESTINAL:  No abdominal pain.  No nausea. No vomiting.  No diarrhea. No   constipation.  No hematemesis.  No hematochezia.  No melena. GENITOURINARY:      No urgency.  No frequency.  No dysuria.  No hematuria.  No  obstructive symptoms.  No discharge.  No pain.  MUSCULOSKELETAL:  +musculoskeletal pain.  No joint swelling.  No arthritis. NEUROLOGICAL:        No confusion.  No weakness. No headache. No seizure. PSYCHIATRIC:             No depression. No anxiety. No suicidal ideation. SKIN:                             No rashes.  No lesions.  No wounds. ENDOCRINE:                No weight loss.  No polydipsia.  No polyuria.  No polyphagia. HEMATOLOGIC:           No purpura.  No petechiae.  No bleeding.  ALLERGIC                 : No pruritus.  No angioedema Other:  Past Medical and Surgical History:   Past Medical History:  Diagnosis Date  . Hypertension   . Substance abuse    Past Surgical History:    Procedure Laterality Date  . APPENDECTOMY    . THORACOTOMY Right 08/29/2016   Procedure: THORACOTOMY MAJOR;  Surgeon: Kerin Perna, MD;  Location: [facility] OR;  Service: Thoracic;  Laterality: Right;  Marland Kitchen VIDEO ASSISTED THORACOSCOPY (VATS)/EMPYEMA Right 08/29/2016   Procedure: VIDEO ASSISTED THORACOSCOPY (VATS)/ DRAINAGE EMPYEMA;  Surgeon: Kerin Perna, MD;  Location: [facility] OR;  Service: Thoracic;  Laterality: Right;    Social History:   reports that he has been smoking Cigarettes.  He has a 12.00 pack-year smoking history. He has never used smokeless tobacco. He reports that he drinks alcohol. He reports that he uses drugs, including Cocaine, Heroin, and Benzodiazepines.    No Known Allergies  Family History  Problem Relation  Age of Onset  . Diabetes Mother   . Hypertension Mother   . CAD Father 83      Prior to Admission medications   Medication Sig Start Date End Date Taking? Authorizing Provider  acetaminophen (TYLENOL) 325 MG tablet Take 650 mg by mouth every 6 (six) hours as needed for mild pain, moderate pain, fever or headache.   Yes [provider]  methadone (DOLOPHINE) 10 MG tablet Take 10 mg by mouth daily.   Yes [provider]  acetaminophen (TYLENOL) 325 MG tablet Take 2 tablets (650 mg total) by mouth every 6 (six) hours as needed for mild pain, moderate pain, fever or headache (or Fever >/= 101). Patient not taking: Reported on 06/26/2017 12/15/16   Elease Etienne, MD  ALPRAZolam Prudy Feeler) 0.25 MG tablet Take 1 tablet (0.25 mg total) by mouth 2 (two) times daily. Patient not taking: Reported on 01/02/2017 12/15/16   Elease Etienne, MD  doxycycline (VIBRA-TABS) 100 MG tablet Take 1 tablet (100 mg total) by mouth 2 (two) times daily. Patient not taking: Reported on 06/26/2017 12/15/16   Elease Etienne, MD  gabapentin (NEURONTIN) 300 MG capsule Take 1 capsule (300 mg total) by mouth 3 (three) times daily. Patient not taking: Reported on 06/26/2017  02/21/17   Gershon Crane E, PA-C  metoprolol tartrate (LOPRESSOR) 25 MG tablet Take 0.5 tablets (12.5 mg total) by mouth 2 (two) times daily. Patient not taking: Reported on 06/26/2017 01/02/17   Rowe Clack, PA-C    Physical Exam: BP (!) 135/59   Pulse 81   Temp (!) 102.8 F (39.3 C) (Oral) Comment: rn aware  Resp (!) 35   Ht 5\' 11"  (1.803 m)   Wt 108.9 kg (240 lb)   SpO2 98%   BMI 33.47 kg/m   GENERAL :   Alert and cooperative, and appears to be in mild acute distress. HEAD:           normocephalic.  THROAT:     Oral cavity and pharynx normal.   NECK:          supple, non-tender. No lymphadenopathy CARDIAC:    Normal S1 and S2. No gallop. No murmurs.  Vascular:     no peripheral edema.  LUNGS:       Clear to auscultation  ABDOMEN: Positive bowel sounds. Soft, nondistended, nontender. No guarding or rebound.      MSK:           No joint erythema or tenderness. Normal muscular development. EXT           : No significant deformity or joint abnormality. Neuro        : Alert, oriented to person, place, and time.                      CN II-XII intact.  SKIN:            No rash. No lesions. PSYCH:       No hallucination. Patient is not suicidal.          Labs on Admission:  Reviewed.   Radiological Exams on Admission: Dg Chest 2 View  Result Date: 06/26/2017 CLINICAL DATA:  Shortness of breath, fever. EXAM: CHEST  2 VIEW COMPARISON:  None. FINDINGS: The heart size and mediastinal contours are within normal limits. No pneumothorax or pleural effusion is noted. Left lung is clear. Mild right basilar subsegmental atelectasis is noted. The visualized skeletal structures are unremarkable. IMPRESSION:  Mild right basilar subsegmental atelectasis. Electronically Signed   By: Lupita Raider, M.D.   On: 06/26/2017 21:10     Assessment/Plan  Opioid withdrawal:  Baclofen prn spasms Ativan prn agitation  Morphine prn severe pain Need to verify methadone dose tomorrow  Psych consult   IVF  Possible sepsis:  bcx set UA and CXR unremarkable Started on vanc/zosyn awaiting bcx results Possible due to dehydration/withdrawal  HTN: hold BP meds  Anxiety: hold xanax while on ativan prn  Input & Output: NA Lines & Tubes: PIV DVT prophylaxis: [place] enoxaparin  GI prophylaxis:NA Consultants: psych Code Status: full Family Communication: at bedside Disposition Plan: TBD    Eston Esters M.D [facility]

## 2017-06-26 NOTE — Progress Notes (Signed)
Pharmacy Antibiotic Note  Bryan Jennings is a 26 y.o. male with hx of IVDU admitted on 06/26/2017 with sepsis.  Pharmacy has been consulted for zosyn and vancomycin dosing.  Plan: Zosyn 3.375g IV q8h (4 hour infusion).  Vancomycin 2 Gm x1 then 1 Gm IV q8h VT=15-20mg /L Daily Scr F/u cultures and levels as indicated  Height: 5\' 11"  (180.3 cm) Weight: 240 lb (108.9 kg) IBW/kg (Calculated) : 75.3  Temp (24hrs), Avg:102.8 F (39.3 C), Min:102.8 F (39.3 C), Max:102.8 F (39.3 C)   Recent Labs Lab 06/26/17 1844 06/26/17 1849 06/26/17 1945 06/26/17 1948  WBC  --   --   --  11.4*  CREATININE  --  0.95  --   --   LATICACIDVEN 3.66*  --  3.19*  --     Estimated Creatinine Clearance: 147.8 mL/min (by C-G formula based on SCr of 0.95 mg/dL).    No Known Allergies  Antimicrobials this admission: 8/22 zosyn >>  8/22 vancomycin >>   Dose adjustments this admission:   Microbiology results:  BCx:   UCx:    Sputum:    MRSA PCR:   Thank you for allowing pharmacy to be a part of this patient's care.  Lorenza Evangelist 06/26/2017 10:59 PM

## 2017-06-26 NOTE — Progress Notes (Signed)
A consult was received from an ED physician for vancomycin and zosyn per pharmacy dosing.  The patient's profile has been reviewed for ht/wt/allergies/indication/available labs.   A one time order has been placed for vancomycin 2g and zosyn 3.375g.  Further antibiotics/pharmacy consults should be ordered by admitting physician if indicated.                       Thank you, Loralee Pacas, PharmD, BCPS Pager: [phone] 06/26/2017 7:15 PM

## 2017-06-27 ENCOUNTER — Inpatient Hospital Stay (HOSPITAL_COMMUNITY): Payer: Self-pay

## 2017-06-27 ENCOUNTER — Encounter (HOSPITAL_COMMUNITY): Payer: Self-pay | Admitting: Nurse Practitioner

## 2017-06-27 DIAGNOSIS — I10 Essential (primary) hypertension: Secondary | ICD-10-CM

## 2017-06-27 DIAGNOSIS — F1920 Other psychoactive substance dependence, uncomplicated: Secondary | ICD-10-CM

## 2017-06-27 DIAGNOSIS — R7881 Bacteremia: Secondary | ICD-10-CM

## 2017-06-27 DIAGNOSIS — F1120 Opioid dependence, uncomplicated: Secondary | ICD-10-CM

## 2017-06-27 LAB — ECHOCARDIOGRAM COMPLETE
CHL CUP RV SYS PRESS: 40 mmHg
CHL CUP TV REG PEAK VELOCITY: 304 cm/s
E decel time: 282 msec
EERAT: 4.88
FS: 29 % (ref 28–44)
Height: 71 in
IVS/LV PW RATIO, ED: 0.9
LA ID, A-P, ES: 35 mm
LA diam end sys: 35 mm
LA diam index: 1.61 cm/m2
LA vol A4C: 34.5 ml
LA vol index: 14.6 mL/m2
LA vol: 31.9 mL
LV TDI E'LATERAL: 16.5
LV TDI E'MEDIAL: 13.2
LV e' LATERAL: 16.5 cm/s
LVEEAVG: 4.88
LVEEMED: 4.88
LVOT VTI: 16.7 cm
LVOT area: 3.46 cm2
LVOT diameter: 21 mm
LVOTPV: 103 cm/s
LVOTSV: 58 mL
MV Dec: 282
MV Peak grad: 3 mmHg
MV pk A vel: 68.6 m/s
MVPKEVEL: 80.6 m/s
PW: 11.8 mm — AB (ref 0.6–1.1)
RV LATERAL S' VELOCITY: 16.3 cm/s
RV TAPSE: 21.9 mm
TR max vel: 304 cm/s
Weight: 3456 oz

## 2017-06-27 LAB — BLOOD CULTURE ID PANEL (REFLEXED)
Acinetobacter baumannii: NOT DETECTED
CANDIDA KRUSEI: NOT DETECTED
CANDIDA PARAPSILOSIS: NOT DETECTED
Candida albicans: NOT DETECTED
Candida glabrata: NOT DETECTED
Candida tropicalis: NOT DETECTED
ENTEROCOCCUS SPECIES: NOT DETECTED
ESCHERICHIA COLI: NOT DETECTED
Enterobacter cloacae complex: NOT DETECTED
Enterobacteriaceae species: NOT DETECTED
HAEMOPHILUS INFLUENZAE: NOT DETECTED
Klebsiella oxytoca: NOT DETECTED
Klebsiella pneumoniae: NOT DETECTED
LISTERIA MONOCYTOGENES: NOT DETECTED
METHICILLIN RESISTANCE: NOT DETECTED
Neisseria meningitidis: NOT DETECTED
PROTEUS SPECIES: NOT DETECTED
Pseudomonas aeruginosa: NOT DETECTED
SERRATIA MARCESCENS: NOT DETECTED
STAPHYLOCOCCUS AUREUS BCID: DETECTED — AB
STAPHYLOCOCCUS SPECIES: DETECTED — AB
STREPTOCOCCUS AGALACTIAE: NOT DETECTED
Streptococcus pneumoniae: NOT DETECTED
Streptococcus pyogenes: NOT DETECTED
Streptococcus species: NOT DETECTED

## 2017-06-27 LAB — COMPREHENSIVE METABOLIC PANEL
ALT: 27 U/L (ref 17–63)
AST: 25 U/L (ref 15–41)
Albumin: 2.7 g/dL — ABNORMAL LOW (ref 3.5–5.0)
Alkaline Phosphatase: 43 U/L (ref 38–126)
Anion gap: 8 (ref 5–15)
BUN: 9 mg/dL (ref 6–20)
CHLORIDE: 105 mmol/L (ref 101–111)
CO2: 23 mmol/L (ref 22–32)
CREATININE: 0.65 mg/dL (ref 0.61–1.24)
Calcium: 8.3 mg/dL — ABNORMAL LOW (ref 8.9–10.3)
GFR calc non Af Amer: >60 mL/min (ref >60)
Glucose, Bld: 115 mg/dL — ABNORMAL HIGH (ref 65–99)
Potassium: 3.3 mmol/L — ABNORMAL LOW (ref 3.5–5.1)
SODIUM: 136 mmol/L (ref 135–145)
Total Bilirubin: 0.9 mg/dL (ref 0.3–1.2)
Total Protein: 6.1 g/dL — ABNORMAL LOW (ref 6.5–8.1)

## 2017-06-27 LAB — CBC
HCT: 35.9 % — ABNORMAL LOW (ref 39.0–52.0)
Hemoglobin: 12 g/dL — ABNORMAL LOW (ref 13.0–17.0)
MCH: 28.6 pg (ref 26.0–34.0)
MCHC: 33.4 g/dL (ref 30.0–36.0)
MCV: 85.5 fL (ref 78.0–100.0)
PLATELETS: 151 10*3/uL (ref 150–400)
RBC: 4.2 MIL/uL — AB (ref 4.22–5.81)
RDW: 12.8 % (ref 11.5–15.5)
WBC: 10 10*3/uL (ref 4.0–10.5)

## 2017-06-27 LAB — MRSA PCR SCREENING: MRSA by PCR: NEGATIVE

## 2017-06-27 MED ORDER — METHADONE HCL 5 MG PO TABS
30.0000 mg | ORAL_TABLET | Freq: Every day | ORAL | Status: DC
  Administered 2017-06-27 – 2017-07-02 (×6): 30 mg via ORAL
  Filled 2017-06-27 (×6): qty 6

## 2017-06-27 MED ORDER — CEFAZOLIN SODIUM-DEXTROSE 2-4 GM/100ML-% IV SOLN
2.0000 g | Freq: Three times a day (TID) | INTRAVENOUS | Status: DC
  Administered 2017-06-27 – 2017-07-10 (×38): 2 g via INTRAVENOUS
  Filled 2017-06-27 (×39): qty 100

## 2017-06-27 MED ORDER — MORPHINE SULFATE (PF) 2 MG/ML IV SOLN
INTRAVENOUS | Status: AC
  Administered 2017-06-27: 2 mg via INTRAVENOUS
  Filled 2017-06-27: qty 1

## 2017-06-27 NOTE — Progress Notes (Signed)
    [facility] has been requested to perform a transesophageal echocardiogram on Bryan Jennings for bacteremia.  After careful review of history and examination, the risks and benefits of transesophageal echocardiogram have been explained including risks of esophageal damage, perforation (1:10,000 risk), bleeding, pharyngeal hematoma as well as other potential complications associated with conscious sedation including aspiration, arrhythmia, respiratory failure and death. Alternatives to treatment were discussed, questions were answered. Patient is willing to proceed.   The TEE is scheduled for 06/28/17 at 13:00 with Dr. Eden Emms and will be performed with anesthesia.  Berton Bon, NP  06/27/2017 12:56 PM

## 2017-06-27 NOTE — Consult Note (Signed)
[facility] for [facility]  Total days of antibiotics 2        Day 2 vanco/piptazo         Reason for Consult: SAB    Referring Physician: nettey  Active Problems:   Polysubstance dependence including opioid type drug, episodic abuse ([place])   Sepsis ([place])   Essential hypertension    HPI: Bryan Jennings, goes by "ty" is a 26 y.o. male who has hx of chronic hep C, and MRSA empyema in Oct 2018 s/p right VATS as well as right chest wall abscess s/p I x D. 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 He reports that he continued to do IV drug use until 1 week prior to admit. He presented to the ED on 8/22 with  fever/chills/diffuse body achesx past 4 days.  He is process of switching to methadone but unable to get to clinic due to feeling poorly, and feels like he is having opiate withdrawal. He denied cough/chest pain but had tachypnea. No N/V/D/C/abd pain/dysuria. No skin wounds/joint swelling/neck stiffness/confusion. No other complaints. In the ED, he was found to be febrile at 102.48F, WBC of 10, LA 3.66. He was started on empiric sepsis coverage with vancomycin and piptazo. UA was negative, and CXR did not show any infiltrates, but his blood cx are showing + growth with gpcc. BCID identified MSSA. He reports feeling better since being placed on methadone.  He reports that he often uses multiple sites of arms,hands, feet, ankles for injecting. Last used veins in lower extremities/ankles.  Past Medical History:  Diagnosis Date  . Hypertension   . Substance abuse     Allergies: No Known Allergies  MEDICATIONS: . enoxaparin (LOVENOX) injection  40 mg Subcutaneous QHS  . methadone  30 mg Oral Daily    Social History  Substance Use Topics  . Smoking status: Current Every Day Smoker    Packs/day: 1.00    Years: 12.00    Types: Cigarettes  . Smokeless tobacco: Never Used  . Alcohol use Yes     Comment: etoh  abuse - reports unsure when he last used, "I just don't like it"    Family History  Problem Relation Age of Onset  . Diabetes Mother   . Hypertension Mother   . CAD Father 13     Review of Systems  Constitutional: positive for fever, chills, diaphoresis, activity change, appetite change, fatigue and unexpected weight change.  HENT: Negative for congestion, sore throat, rhinorrhea, sneezing, trouble swallowing and sinus pressure.  Eyes: Negative for photophobia and visual disturbance.  Respiratory: Negative for cough, chest tightness, shortness of breath, wheezing and stridor.  Cardiovascular: Negative for chest pain, palpitations and leg swelling.  Gastrointestinal: Negative for nausea, vomiting, abdominal pain, diarrhea, constipation, blood in stool, abdominal distention and anal bleeding.  Genitourinary: Negative for dysuria, hematuria, flank pain and difficulty urinating.  Musculoskeletal: positive for myalgias, but negative for back pain, joint swelling, arthralgias and gait problem.  Skin: Negative for color change, pallor, rash and wound.  Neurological: Negative for dizziness, tremors, weakness and light-headedness.  Hematological: Negative for adenopathy. Does not bruise/bleed easily.  Psychiatric/Behavioral: Negative for behavioral problems, confusion, sleep disturbance, dysphoric mood, decreased concentration and agitation.     OBJECTIVE: Temp:  [98.8 F (37.1 C)-102.8 F (39.3 C)] 98.8 F (37.1 C) (08/23 1155) Pulse Rate:  [71-126] 126 (08/23 1155) Resp:  [20-38] 20 (08/23 0422) BP: (106-140)/(57-112) 106/58 (08/23  1155) SpO2:  [95 %-99 %] 96 % (08/23 1155) Weight:  [216 lb (98 kg)-240 lb (108.9 kg)] 216 lb (98 kg) (08/23 0059) Physical Exam  Constitutional: He is oriented to person, place, and time. He appears well-developed and well-nourished. No distress.  HENT:  Mouth/Throat: Oropharynx is clear and moist. No oropharyngeal exudate.  Cardiovascular: Normal rate,  regular rhythm and normal heart sounds. Exam reveals no gallop and no friction rub.  No murmur heard.  Pulmonary/Chest: Effort normal and breath sounds normal. No respiratory distress. He has no wheezes.  Abdominal: Soft. Bowel sounds are normal. He exhibits no distension. There is no tenderness.  Lymphadenopathy:  He has no cervical adenopathy.  Neurological: He is alert and oriented to person, place, and time.  Skin: Skin is warm and dry. Multiple scars to upper extremities. And puncture marks, healed to legs. No other stigmata of endocarditis Psychiatric: He has a normal mood and affect. His behavior is normal.    LABS: Results for orders placed or performed during the hospital encounter of 06/26/17 (from the past 48 hour(s))  Culture, blood (Routine x 2)     Status: None (Preliminary result)   Collection Time: 06/26/17  6:40 PM  Result Value Ref Range   Specimen Description BLOOD LEFT ANTECUBITAL    Special Requests IN PEDIATRIC BOTTLE Blood Culture adequate volume    Culture  Setup Time      GRAM POSITIVE COCCI IN CLUSTERS IN PEDIATRIC BOTTLE CRITICAL RESULT CALLED TO, READ BACK BY AND VERIFIED WITH: E. JACKSON, RPHARMD ([place]) AT 3329 ON 06/27/17 BY C. JESSUP, MLT. Performed at [facility], [place] [address] [place], [place] [address]    Culture GRAM POSITIVE COCCI    Report Status PENDING   Blood Culture ID Panel (Reflexed)     Status: Abnormal   Collection Time: 06/26/17  6:40 PM  Result Value Ref Range   Enterococcus species NOT DETECTED NOT DETECTED   Listeria monocytogenes NOT DETECTED NOT DETECTED   Staphylococcus species DETECTED (A) NOT DETECTED    Comment: CRITICAL RESULT CALLED TO, READ BACK BY AND VERIFIED WITH: E. JACKSON, RPHARMD (WL) AT 1660 ON 06/27/17 BY C. JESSUP, MLT.    Staphylococcus aureus DETECTED (A) NOT DETECTED    Comment: Methicillin (oxacillin) susceptible Staphylococcus aureus (MSSA). Preferred therapy is anti staphylococcal beta lactam antibiotic  (Cefazolin or Nafcillin), unless clinically contraindicated. CRITICAL RESULT CALLED TO, READ BACK BY AND VERIFIED WITH: E. JACKSON, RPHARMD ([place]) AT 6301 ON 06/27/17 BY C. JESSUP, MLT.    Methicillin resistance NOT DETECTED NOT DETECTED   Streptococcus species NOT DETECTED NOT DETECTED   Streptococcus agalactiae NOT DETECTED NOT DETECTED   Streptococcus pneumoniae NOT DETECTED NOT DETECTED   Streptococcus pyogenes NOT DETECTED NOT DETECTED   Acinetobacter baumannii NOT DETECTED NOT DETECTED   Enterobacteriaceae species NOT DETECTED NOT DETECTED   Enterobacter cloacae complex NOT DETECTED NOT DETECTED   Escherichia coli NOT DETECTED NOT DETECTED   Klebsiella oxytoca NOT DETECTED NOT DETECTED   Klebsiella pneumoniae NOT DETECTED NOT DETECTED   Proteus species NOT DETECTED NOT DETECTED   Serratia marcescens NOT DETECTED NOT DETECTED   Haemophilus influenzae NOT DETECTED NOT DETECTED   Neisseria meningitidis NOT DETECTED NOT DETECTED   Pseudomonas aeruginosa NOT DETECTED NOT DETECTED   Candida albicans NOT DETECTED NOT DETECTED   Candida glabrata NOT DETECTED NOT DETECTED   Candida krusei NOT DETECTED NOT DETECTED   Candida parapsilosis NOT DETECTED NOT DETECTED   Candida tropicalis NOT DETECTED NOT  DETECTED    Comment: Performed at [facility], [place] [address], [place], [place] [address]  CG4 I-STAT (Lactic acid)     Status: Abnormal   Collection Time: 06/26/17  6:44 PM  Result Value Ref Range   Lactic Acid, Venous 3.66 (HH) 0.5 - 1.9 mmol/L   Comment NOTIFIED PHYSICIAN   Comprehensive metabolic panel     Status: Abnormal   Collection Time: 06/26/17  6:49 PM  Result Value Ref Range   Sodium 132 (L) 135 - 145 mmol/L   Potassium 4.5 3.5 - 5.1 mmol/L   Chloride 97 (L) 101 - 111 mmol/L   CO2 22 22 - 32 mmol/L   Glucose, Bld 158 (H) 65 - 99 mg/dL   BUN 12 6 - 20 mg/dL   Creatinine, Ser 0.95 0.61 - 1.24 mg/dL   Calcium 9.1 8.9 - 10.3 mg/dL   Total Protein 7.8 6.5 - 8.1 g/dL    Albumin 3.6 3.5 - 5.0 g/dL   AST 49 (H) 15 - 41 U/L   ALT 36 17 - 63 U/L   Alkaline Phosphatase 65 38 - 126 U/L   Total Bilirubin 1.8 (H) 0.3 - 1.2 mg/dL   GFR calc non Af Amer >60 >60 mL/min   GFR calc Af Amer >60 >60 mL/min    Comment: (NOTE) The eGFR has been calculated using the CKD EPI equation. This calculation has not been validated in all clinical situations. eGFR's persistently <60 mL/min signify possible Chronic Kidney Disease.    Anion gap 13 5 - 15  Protime-INR     Status: None   Collection Time: 06/26/17  6:49 PM  Result Value Ref Range   Prothrombin Time 13.7 11.4 - 15.2 seconds   INR 1.05   Culture, blood (Routine x 2)     Status: None (Preliminary result)   Collection Time: 06/26/17  6:50 PM  Result Value Ref Range   Specimen Description BLOOD RIGHT ANTECUBITAL    Special Requests      BOTTLES DRAWN AEROBIC AND ANAEROBIC Blood Culture results may not be optimal due to an inadequate volume of blood received in culture bottles   Culture  Setup Time      GRAM POSITIVE COCCI IN CLUSTERS IN BOTH AEROBIC AND ANAEROBIC BOTTLES CRITICAL VALUE NOTED.  VALUE IS CONSISTENT WITH PREVIOUSLY REPORTED AND CALLED VALUE. Performed at [facility], [place] [address] [place], [place] [address]    Culture GRAM POSITIVE COCCI    Report Status PENDING   Culture, blood (single)     Status: None (Preliminary result)   Collection Time: 06/26/17  7:38 PM  Result Value Ref Range   Specimen Description BLOOD LEFT HAND    Special Requests IN PEDIATRIC BOTTLE Blood Culture adequate volume    Culture  Setup Time      GRAM POSITIVE COCCI IN CLUSTERS IN PEDIATRIC BOTTLE Performed at [facility], [place] [address], [place], [place] [address]    Culture GRAM POSITIVE COCCI    Report Status PENDING   POCT i-Stat troponin I     Status: None   Collection Time: 06/26/17  7:42 PM  Result Value Ref Range   Troponin i, poc 0.00 0.00 - 0.08 ng/mL   Comment 3            Comment: Due to  the release kinetics of cTnI, a negative result within the first hours of the onset of symptoms does not rule out myocardial infarction with certainty.  If myocardial infarction is still suspected, repeat the test at appropriate intervals.   CG4 I-STAT (Lactic acid)     Status: Abnormal   Collection Time: 06/26/17  7:45 PM  Result Value Ref Range   Lactic Acid, Venous 3.19 (HH) 0.5 - 1.9 mmol/L   Comment NOTIFIED PHYSICIAN   CBC with Differential/Platelet     Status: Abnormal   Collection Time: 06/26/17  7:48 PM  Result Value Ref Range   WBC 11.4 (H) 4.0 - 10.5 K/uL   RBC 4.78 4.22 - 5.81 MIL/uL   Hemoglobin 14.1 13.0 - 17.0 g/dL   HCT 39.7 39.0 - 52.0 %   MCV 83.1 78.0 - 100.0 fL   MCH 29.5 26.0 - 34.0 pg   MCHC 35.5 30.0 - 36.0 g/dL   RDW 12.3 11.5 - 15.5 %   Platelets 180 150 - 400 K/uL   Neutrophils Relative % 86 %   Neutro Abs 9.8 (H) 1.7 - 7.7 K/uL   Lymphocytes Relative 5 %   Lymphs Abs 0.6 (L) 0.7 - 4.0 K/uL   Monocytes Relative 9 %   Monocytes Absolute 1.0 0.1 - 1.0 K/uL   Eosinophils Relative 0 %   Eosinophils Absolute 0.0 0.0 - 0.7 K/uL   Basophils Relative 0 %   Basophils Absolute 0.0 0.0 - 0.1 K/uL  Urinalysis, Routine w reflex microscopic     Status: Abnormal   Collection Time: 06/26/17 10:16 PM  Result Value Ref Range   Color, Urine YELLOW YELLOW   APPearance CLEAR CLEAR   Specific Gravity, Urine 1.020 1.005 - 1.030   pH 6.0 5.0 - 8.0   Glucose, UA NEGATIVE NEGATIVE mg/dL   Hgb urine dipstick SMALL (A) NEGATIVE   Bilirubin Urine NEGATIVE NEGATIVE   Ketones, ur NEGATIVE NEGATIVE mg/dL   Protein, ur 30 (A) NEGATIVE mg/dL   Nitrite NEGATIVE NEGATIVE   Leukocytes, UA NEGATIVE NEGATIVE   RBC / HPF 6-30 0 - 5 RBC/hpf   WBC, UA 0-5 0 - 5 WBC/hpf   Bacteria, UA NONE SEEN NONE SEEN   Squamous Epithelial / LPF NONE SEEN NONE SEEN   Mucus PRESENT   Urine rapid drug screen (hosp performed)     Status: Abnormal   Collection Time: 06/26/17 10:16 PM  Result Value  Ref Range   Opiates POSITIVE (A) NONE DETECTED   Cocaine NONE DETECTED NONE DETECTED   Benzodiazepines POSITIVE (A) NONE DETECTED   Amphetamines POSITIVE (A) NONE DETECTED   Tetrahydrocannabinol POSITIVE (A) NONE DETECTED   Barbiturates NONE DETECTED NONE DETECTED    Comment:        DRUG SCREEN FOR MEDICAL PURPOSES ONLY.  IF CONFIRMATION IS NEEDED FOR ANY PURPOSE, NOTIFY LAB WITHIN 5 DAYS.        LOWEST DETECTABLE LIMITS FOR URINE DRUG SCREEN Drug Class       Cutoff (ng/mL) Amphetamine      1000 Barbiturate      200 Benzodiazepine   154 Tricyclics       008 Opiates          300 Cocaine          300 THC              50   MRSA PCR Screening     Status: None   Collection Time: 06/27/17  1:12 AM  Result Value Ref Range   MRSA by PCR NEGATIVE NEGATIVE    Comment:        The GeneXpert MRSA Assay (FDA  approved for NASAL specimens only), is one component of a comprehensive MRSA colonization surveillance program. It is not intended to diagnose MRSA infection nor to guide or monitor treatment for MRSA infections.   Comprehensive metabolic panel     Status: Abnormal   Collection Time: 06/27/17  6:01 AM  Result Value Ref Range   Sodium 136 135 - 145 mmol/L   Potassium 3.3 (L) 3.5 - 5.1 mmol/L    Comment: DELTA CHECK NOTED   Chloride 105 101 - 111 mmol/L   CO2 23 22 - 32 mmol/L   Glucose, Bld 115 (H) 65 - 99 mg/dL   BUN 9 6 - 20 mg/dL   Creatinine, Ser 0.65 0.61 - 1.24 mg/dL   Calcium 8.3 (L) 8.9 - 10.3 mg/dL   Total Protein 6.1 (L) 6.5 - 8.1 g/dL   Albumin 2.7 (L) 3.5 - 5.0 g/dL   AST 25 15 - 41 U/L   ALT 27 17 - 63 U/L   Alkaline Phosphatase 43 38 - 126 U/L   Total Bilirubin 0.9 0.3 - 1.2 mg/dL   GFR calc non Af Amer >60 >60 mL/min   GFR calc Af Amer >60 >60 mL/min    Comment: (NOTE) The eGFR has been calculated using the CKD EPI equation. This calculation has not been validated in all clinical situations. eGFR's persistently <60 mL/min signify possible Chronic  Kidney Disease.    Anion gap 8 5 - 15  CBC     Status: Abnormal   Collection Time: 06/27/17  6:01 AM  Result Value Ref Range   WBC 10.0 4.0 - 10.5 K/uL   RBC 4.20 (L) 4.22 - 5.81 MIL/uL   Hemoglobin 12.0 (L) 13.0 - 17.0 g/dL   HCT 35.9 (L) 39.0 - 52.0 %   MCV 85.5 78.0 - 100.0 fL   MCH 28.6 26.0 - 34.0 pg   MCHC 33.4 30.0 - 36.0 g/dL   RDW 12.8 11.5 - 15.5 %   Platelets 151 150 - 400 K/uL    MICRO: 8/22 blood cx MSSA x 3 IMAGING: Dg Chest 2 View  Result Date: 06/26/2017 CLINICAL DATA:  Shortness of breath, fever. EXAM: CHEST  2 VIEW COMPARISON:  None. FINDINGS: The heart size and mediastinal contours are within normal limits. No pneumothorax or pleural effusion is noted. Left lung is clear. Mild right basilar subsegmental atelectasis is noted. The visualized skeletal structures are unremarkable. IMPRESSION: Mild right basilar subsegmental atelectasis. Electronically Signed   By: Marijo Conception, M.D.   On: 06/26/2017 21:10    Assessment/Plan:  26yo M with history of IV drug use/opiate dependence admitted for fever,chills, myalgias concerning for withdrawal but now found to have MSSA bacteremia  - change antibiotics to nafcillin 2gm IV Q 4hr. Stop vanco and piptazo  - he is planned for TEE tomorrow, to help determine length of course of treatment   - repeat blood cx tomorrow to see if clearing his bacteremia  Chronic hepatitis c without hepatic coma - please check hep C viral load with genotype (ords have been placed)  Opiate withdrawal = now being bridged back to methadone

## 2017-06-27 NOTE — Progress Notes (Signed)
PHARMACY - PHYSICIAN COMMUNICATION CRITICAL VALUE ALERT - BLOOD CULTURE IDENTIFICATION (BCID)  Results for orders placed or performed during the hospital encounter of 06/26/17  Blood Culture ID Panel (Reflexed) (Collected: 06/26/2017  6:40 PM)  Result Value Ref Range   Enterococcus species NOT DETECTED NOT DETECTED   Listeria monocytogenes NOT DETECTED NOT DETECTED   Staphylococcus species DETECTED (A) NOT DETECTED   Staphylococcus aureus DETECTED (A) NOT DETECTED   Methicillin resistance NOT DETECTED NOT DETECTED   Streptococcus species NOT DETECTED NOT DETECTED   Streptococcus agalactiae NOT DETECTED NOT DETECTED   Streptococcus pneumoniae NOT DETECTED NOT DETECTED   Streptococcus pyogenes NOT DETECTED NOT DETECTED   Acinetobacter baumannii NOT DETECTED NOT DETECTED   Enterobacteriaceae species NOT DETECTED NOT DETECTED   Enterobacter cloacae complex NOT DETECTED NOT DETECTED   Escherichia coli NOT DETECTED NOT DETECTED   Klebsiella oxytoca NOT DETECTED NOT DETECTED   Klebsiella pneumoniae NOT DETECTED NOT DETECTED   Proteus species NOT DETECTED NOT DETECTED   Serratia marcescens NOT DETECTED NOT DETECTED   Haemophilus influenzae NOT DETECTED NOT DETECTED   Neisseria meningitidis NOT DETECTED NOT DETECTED   Pseudomonas aeruginosa NOT DETECTED NOT DETECTED   Candida albicans NOT DETECTED NOT DETECTED   Candida glabrata NOT DETECTED NOT DETECTED   Candida krusei NOT DETECTED NOT DETECTED   Candida parapsilosis NOT DETECTED NOT DETECTED   Candida tropicalis NOT DETECTED NOT DETECTED    Name of physician (or Provider) Contacted: Dr. Caleb Popp  Changes to prescribed antibiotics required: none for now  Dannielle Huh 06/27/2017  9:35 AM

## 2017-06-27 NOTE — Progress Notes (Signed)
[facility] called and transport set up for pt to be taken to [name] for TEE. Pickup at 1000 for 1030 arrival. TEE scheduled at 1200. As per MD note TEE originally scheduled at 1300 but endo scheduler called and notified RN that TEE was moved from 1300 to 1200.

## 2017-06-27 NOTE — Progress Notes (Signed)
Patient scheduled for TEE @ noon on 8.24.18. Spoke with bedside RN to set up [facility] to have patient arrive @ 1030 am.

## 2017-06-27 NOTE — Progress Notes (Signed)
PROGRESS NOTE    Bryan Jennings  ZOX:[id number] DOB: Mar 18, 1991 DOA: 06/26/2017 PCP: Patient, No Pcp Per   Brief Narrative: Bryan Jennings is a 26 y.o. 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 He presented with complaints of fever, chills, body aches in the setting of recent IV drug use. He was started on empiric antibiotics and was found to have Gram-Positive Cocci in clusters.   Assessment & Plan:   Active Problems:   Sepsis (HCC)   Sepsis secondary to gram positive cocci in clusters Gram stain significant for gram positive cocci in clusters. Likely secondary to IVDU. Afebrile overnight -ID recommendations -Echocardiogram -Consult cardiology for likely need for TEE -Blood cultures pending  IVDU Patient last sees heparin via IV between 3 and 4 days ago. He reports shooting up in multiple sites. He states that he was also started on methadone recently. She has a recent admission involving recurrent MRSA chest wall/pleural space abscess -Pharmacy to help with methadone dosing; need to verify dosing tomorrow  Essential hypertension Patient previously treated with metoprolol for which is not taking. Currently stable.   DVT prophylaxis: Lovenox Code Status: Full code Family Communication: Fianc at bedside Disposition Plan: Pending medical improvement   Consultants:   Infectious disease  Cardiology  Procedures:   None  Antimicrobials:  Vancomycin (8/22>>8/23)  Zosyn (8/22>>8/23)  Cefazolin (8/23>>    Subjective: No issues overnight. Afebrile overnight. Asking for morphine.  Objective: Vitals:   06/26/17 2300 06/27/17 0000 06/27/17 0059 06/27/17 0422  BP: (!) 111/57 140/80 119/74 128/72  Pulse: 90 89 94 88  Resp:   (!) 22 20  Temp:   100.3 F (37.9 C) 98.9 F (37.2 C)  TempSrc:   Oral Oral  SpO2: 99% 95% 95% 96%  Weight:   98 kg (216 lb)   Height:   5\' 11"  (1.803 m)     Intake/Output Summary (Last 24 hours) at 06/27/17  0847 Last data filed at 06/27/17 0600  Gross per 24 hour  Intake             5035 ml  Output              700 ml  Net             4335 ml   Filed Weights   06/26/17 1814 06/27/17 0059  Weight: 108.9 kg (240 lb) 98 kg (216 lb)    Examination:  General exam: Appears calm and comfortable Respiratory system: Clear to auscultation. Respiratory effort normal. Cardiovascular system: S1 & S2 heard, RRR. No murmurs. Gastrointestinal system: Abdomen is nondistended, soft and nontender. Normal bowel sounds heard. Central nervous system: Alert and oriented. No focal neurological deficits. Extremities: No edema. No calf tenderness Skin: No cyanosis. No rashes Psychiatry: Judgement and insight appear normal. Mood & affect appropriate.     Data Reviewed: I have personally reviewed following labs and imaging studies  CBC:  Recent Labs Lab 06/26/17 1948 06/27/17 0601  WBC 11.4* 10.0  NEUTROABS 9.8*  --   HGB 14.1 12.0*  HCT 39.7 35.9*  MCV 83.1 85.5  PLT 180 151   Basic Metabolic Panel:  Recent Labs Lab 06/26/17 1849 06/27/17 0601  NA 132* 136  K 4.5 3.3*  CL 97* 105  CO2 22 23  GLUCOSE 158* 115*  BUN 12 9  CREATININE 0.95 0.65  CALCIUM 9.1 8.3*   GFR: Estimated Creatinine Clearance: 167 mL/min (by C-G formula based on SCr of 0.65 mg/dL). Liver  Function Tests:  Recent Labs Lab 06/26/17 1849 06/27/17 0601  AST 49* 25  ALT 36 27  ALKPHOS 65 43  BILITOT 1.8* 0.9  PROT 7.8 6.1*  ALBUMIN 3.6 2.7*   No results for input(s): LIPASE, AMYLASE in the last 168 hours. No results for input(s): AMMONIA in the last 168 hours. Coagulation Profile:  Recent Labs Lab 06/26/17 1849  INR 1.05   Cardiac Enzymes: No results for input(s): CKTOTAL, CKMB, CKMBINDEX, TROPONINI in the last 168 hours. BNP (last 3 results) No results for input(s): PROBNP in the last 8760 hours. HbA1C: No results for input(s): HGBA1C in the last 72 hours. CBG: No results for input(s): GLUCAP in  the last 168 hours. Lipid Profile: No results for input(s): CHOL, HDL, LDLCALC, TRIG, CHOLHDL, LDLDIRECT in the last 72 hours. Thyroid Function Tests: No results for input(s): TSH, T4TOTAL, FREET4, T3FREE, THYROIDAB in the last 72 hours. Anemia Panel: No results for input(s): VITAMINB12, FOLATE, FERRITIN, TIBC, IRON, RETICCTPCT in the last 72 hours. Sepsis Labs:  Recent Labs Lab 06/26/17 1844 06/26/17 1945  LATICACIDVEN 3.66* 3.19*    Recent Results (from the past 240 hour(s))  Culture, blood (Routine x 2)     Status: None (Preliminary result)   Collection Time: 06/26/17  6:40 PM  Result Value Ref Range Status   Specimen Description BLOOD LEFT ANTECUBITAL  Final   Special Requests IN PEDIATRIC BOTTLE Blood Culture adequate volume  Final   Culture  Setup Time   Final    GRAM POSITIVE COCCI IN CLUSTERS IN PEDIATRIC BOTTLE Organism ID to follow Performed at [facility], [address] [place], [place] [address]    Culture GRAM POSITIVE COCCI  Final   Report Status PENDING  Incomplete  Culture, blood (single)     Status: None (Preliminary result)   Collection Time: 06/26/17  7:38 PM  Result Value Ref Range Status   Specimen Description BLOOD LEFT HAND  Final   Special Requests IN PEDIATRIC BOTTLE Blood Culture adequate volume  Final   Culture  Setup Time   Final    GRAM POSITIVE COCCI IN CLUSTERS IN PEDIATRIC BOTTLE Performed at [facility], [address], [place], [place] [address]    Culture GRAM POSITIVE COCCI  Final   Report Status PENDING  Incomplete  MRSA PCR Screening     Status: None   Collection Time: 06/27/17  1:12 AM  Result Value Ref Range Status   MRSA by PCR NEGATIVE NEGATIVE Final    Comment:        The GeneXpert MRSA Assay (FDA approved for NASAL specimens only), is one component of a comprehensive MRSA colonization surveillance program. It is not intended to diagnose MRSA infection nor to guide or monitor treatment for MRSA  infections.          Radiology Studies: Dg Chest 2 View  Result Date: 06/26/2017 CLINICAL DATA:  Shortness of breath, fever. EXAM: CHEST  2 VIEW COMPARISON:  None. FINDINGS: The heart size and mediastinal contours are within normal limits. No pneumothorax or pleural effusion is noted. Left lung is clear. Mild right basilar subsegmental atelectasis is noted. The visualized skeletal structures are unremarkable. IMPRESSION: Mild right basilar subsegmental atelectasis. Electronically Signed   By: Lupita Raider, M.D.   On: 06/26/2017 21:10        Scheduled Meds: . enoxaparin (LOVENOX) injection  40 mg Subcutaneous QHS   Continuous Infusions: . sodium chloride 150 mL/hr at 06/27/17 0603  .  piperacillin-tazobactam (ZOSYN)  IV Stopped (06/27/17 0518)  . vancomycin 1,000 mg (06/27/17 0553)     LOS: 1 day     Jacquelin Hawking, MD [facility] 06/27/2017, 8:47 AM Pager: [phone]  If 7PM-7AM, please contact night-coverage [email or web address] Password [facility] 06/27/2017, 8:47 AM

## 2017-06-27 NOTE — Progress Notes (Signed)
  Echocardiogram 2D Echocardiogram has been performed.  Kimanh Templeman T Gladies Sofranko 06/27/2017, 4:13 PM

## 2017-06-28 ENCOUNTER — Encounter (HOSPITAL_COMMUNITY): Payer: Self-pay | Admitting: *Deleted

## 2017-06-28 ENCOUNTER — Encounter (HOSPITAL_COMMUNITY)
Admission: EM | Disposition: A | Discharge status: Home / Self Care | Payer: Self-pay | Source: Home / Self Care | Attending: Internal Medicine

## 2017-06-28 ENCOUNTER — Ambulatory Visit (HOSPITAL_COMMUNITY): Admit: 2017-06-28 | Payer: MEDICAID | Admitting: Cardiovascular Disease

## 2017-06-28 ENCOUNTER — Inpatient Hospital Stay (HOSPITAL_COMMUNITY): Payer: Self-pay | Admitting: Anesthesiology

## 2017-06-28 ENCOUNTER — Inpatient Hospital Stay (HOSPITAL_COMMUNITY): Payer: Self-pay

## 2017-06-28 DIAGNOSIS — I361 Nonrheumatic tricuspid (valve) insufficiency: Secondary | ICD-10-CM

## 2017-06-28 DIAGNOSIS — I071 Rheumatic tricuspid insufficiency: Secondary | ICD-10-CM

## 2017-06-28 DIAGNOSIS — A4101 Sepsis due to Methicillin susceptible Staphylococcus aureus: Principal | ICD-10-CM

## 2017-06-28 HISTORY — PX: TEE WITHOUT CARDIOVERSION: SHX5443

## 2017-06-28 SURGERY — ECHOCARDIOGRAM, TRANSESOPHAGEAL
Anesthesia: Monitor Anesthesia Care

## 2017-06-28 MED ORDER — PROPOFOL 500 MG/50ML IV EMUL
INTRAVENOUS | Status: DC | PRN
  Administered 2017-06-28: 100 ug/kg/min via INTRAVENOUS

## 2017-06-28 MED ORDER — BUTAMBEN-TETRACAINE-BENZOCAINE 2-2-14 % EX AERO
INHALATION_SPRAY | CUTANEOUS | Status: DC | PRN
  Administered 2017-06-28: 2 via TOPICAL

## 2017-06-28 MED ORDER — PROPOFOL 10 MG/ML IV BOLUS
INTRAVENOUS | Status: DC | PRN
  Administered 2017-06-28 (×2): 30 mg via INTRAVENOUS

## 2017-06-28 MED ORDER — LORAZEPAM 2 MG/ML IJ SOLN: 0.5000 mg | INTRAMUSCULAR | Status: DC | PRN

## 2017-06-28 MED ORDER — LORAZEPAM 0.5 MG PO TABS
0.5000 mg | ORAL_TABLET | Freq: Four times a day (QID) | ORAL | Status: DC | PRN
  Administered 2017-06-28 – 2017-07-06 (×20): 0.5 mg via ORAL
  Filled 2017-06-28 (×20): qty 1

## 2017-06-28 MED ORDER — SODIUM CHLORIDE 0.9 % IV SOLN: INTRAVENOUS | Status: DC

## 2017-06-28 MED ORDER — LORAZEPAM 2 MG/ML IJ SOLN
0.5000 mg | Freq: Once | INTRAMUSCULAR | Status: AC
  Administered 2017-06-28: 0.5 mg via INTRAVENOUS
  Filled 2017-06-28: qty 1

## 2017-06-28 MED ORDER — LIDOCAINE HCL (CARDIAC) 20 MG/ML IV SOLN
INTRAVENOUS | Status: DC | PRN
  Administered 2017-06-28: 30 mg via INTRAVENOUS

## 2017-06-28 MED ORDER — SODIUM CHLORIDE 0.9 % IV SOLN
INTRAVENOUS | Status: DC | PRN
  Administered 2017-06-28: 12:00:00 via INTRAVENOUS

## 2017-06-28 NOTE — Progress Notes (Signed)
    [facility]    Date of Admission:  06/26/2017   Total days of antibiotics 3        Day 2 cefazolin           ID: Bryan Jennings is a 26 y.o. 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  Dr Orvan Falconer available for questions over the weekend. Will see back on monday  Bryan Jennings, [facility] Cell: [phone] Pager: [phone]  06/28/2017, 5:13 PM

## 2017-06-28 NOTE — Progress Notes (Addendum)
Pt IV occluded. IV team called d/t hard stick. Pt refused and told IV team nurse that he was tired and she needed to come back in the am.

## 2017-06-28 NOTE — Anesthesia Postprocedure Evaluation (Signed)
Anesthesia Post Note  Patient: Bryan Jennings  Procedure(s) Performed: Procedure(s) (LRB): TRANSESOPHAGEAL ECHOCARDIOGRAM (TEE) WITH MAC (N/A)     Patient location during evaluation: PACU Anesthesia Type: MAC Level of consciousness: awake and alert Pain management: pain level controlled Vital Signs Assessment: post-procedure vital signs reviewed and stable Respiratory status: spontaneous breathing, nonlabored ventilation and respiratory function stable Cardiovascular status: stable and blood pressure returned to baseline Anesthetic complications: no    Last Vitals:  Vitals:   06/28/17 1320 06/28/17 1330  BP: (!) 136/99 122/82  Pulse: 76 74  Resp: (!) 30 (!) 34  Temp:    SpO2: 98% 98%    Last Pain:  Vitals:   06/28/17 1251  TempSrc: Oral  PainSc:                  [name],W. EDMOND

## 2017-06-28 NOTE — Progress Notes (Signed)
PROGRESS NOTE    Bryan Jennings  ZOX:[id number] DOB: 07/04/1991 DOA: 06/26/2017 PCP: Patient, No Pcp Per   Brief Narrative: Bryan Jennings is a 26 y.o. 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 He presented with complaints of fever, chills, body aches in the setting of recent IV drug use. He was started on empiric antibiotics and was found to have Gram-Positive Cocci in clusters.   Assessment & Plan:   Active Problems:   Polysubstance dependence including opioid type drug, episodic abuse (HCC)   Sepsis (HCC)   Essential hypertension   Severe tricuspid valve regurgitation   Sepsis secondary to gram positive cocci in clusters Gram stain significant for gram positive cocci in clusters. Secondary to IVDU. Echocardiogram was significant for an EF of 50% with mild diffuse hypokinesis and tricuspid regurgitation with evidence of partial flail of the tricuspid valve. -ID recommendations: Repeat blood cultures -TEE pending -Blood cultures pending  IVDU Patient last sees heparin via IV between 3 and 4 days ago. He reports shooting up in multiple sites. He states that he was also started on methadone recently. She has a recent admission involving recurrent MRSA chest wall/pleural space abscess -Continue methadone 30 mg daily  Essential hypertension Patient previously treated with metoprolol for which is not taking. Currently stable.  Tricuspid regurgitation Vegetation cannot be ruled out with TTE. -TEE pending   DVT prophylaxis: Lovenox Code Status: Full code Family Communication: Fianc at bedside Disposition Plan: Pending medical improvement   Consultants:   Infectious disease  Cardiology  Procedures:   None  Antimicrobials:  Vancomycin (8/22>>8/23)  Zosyn (8/22>>8/23)  Cefazolin (8/23>>    Subjective: Afebrile overnight.  Objective: Vitals:   06/28/17 1320 06/28/17 1330 06/28/17 1340 06/28/17 1350  BP: (!) 136/99 122/82 137/71  123/86  Pulse: 76 74 90 75  Resp: (!) 30 (!) 34 (!) 33 (!) 31  Temp:      TempSrc:      SpO2: 98% 98% 96% 96%  Weight:      Height:        Intake/Output Summary (Last 24 hours) at 06/28/17 1421 Last data filed at 06/28/17 1245  Gross per 24 hour  Intake             3250 ml  Output              650 ml  Net             2600 ml   Filed Weights   06/26/17 1814 06/27/17 0059  Weight: 108.9 kg (240 lb) 98 kg (216 lb)    Examination:  General exam: Appears calm and comfortable Respiratory system: Clear to auscultation. Respiratory effort normal. Cardiovascular system: Regular rate and rhythm. Normal S1 and S2. No heart murmurs present. No extra heart sounds Gastrointestinal system: Abdomen is nondistended, soft and nontender. Normal bowel sounds heard. Central nervous system: Alert and oriented. No focal neurological deficits. Extremities: No edema. No calf tenderness Skin: No cyanosis. No rashes Psychiatry: Judgement and insight appear normal. Mood & affect appropriate.     Data Reviewed: I have personally reviewed following labs and imaging studies  CBC:  Recent Labs Lab 06/26/17 1948 06/27/17 0601  WBC 11.4* 10.0  NEUTROABS 9.8*  --   HGB 14.1 12.0*  HCT 39.7 35.9*  MCV 83.1 85.5  PLT 180 151   Basic Metabolic Panel:  Recent Labs Lab 06/26/17 1849 06/27/17 0601  NA 132* 136  K 4.5 3.3*  CL 97*  105  CO2 22 23  GLUCOSE 158* 115*  BUN 12 9  CREATININE 0.95 0.65  CALCIUM 9.1 8.3*   GFR: Estimated Creatinine Clearance: 167 mL/min (by C-G formula based on SCr of 0.65 mg/dL). Liver Function Tests:  Recent Labs Lab 06/26/17 1849 06/27/17 0601  AST 49* 25  ALT 36 27  ALKPHOS 65 43  BILITOT 1.8* 0.9  PROT 7.8 6.1*  ALBUMIN 3.6 2.7*   No results for input(s): LIPASE, AMYLASE in the last 168 hours. No results for input(s): AMMONIA in the last 168 hours. Coagulation Profile:  Recent Labs Lab 06/26/17 1849  INR 1.05   Cardiac Enzymes: No results  for input(s): CKTOTAL, CKMB, CKMBINDEX, TROPONINI in the last 168 hours. BNP (last 3 results) No results for input(s): PROBNP in the last 8760 hours. HbA1C: No results for input(s): HGBA1C in the last 72 hours. CBG: No results for input(s): GLUCAP in the last 168 hours. Lipid Profile: No results for input(s): CHOL, HDL, LDLCALC, TRIG, CHOLHDL, LDLDIRECT in the last 72 hours. Thyroid Function Tests: No results for input(s): TSH, T4TOTAL, FREET4, T3FREE, THYROIDAB in the last 72 hours. Anemia Panel: No results for input(s): VITAMINB12, FOLATE, FERRITIN, TIBC, IRON, RETICCTPCT in the last 72 hours. Sepsis Labs:  Recent Labs Lab 06/26/17 1844 06/26/17 1945  LATICACIDVEN 3.66* 3.19*    Recent Results (from the past 240 hour(s))  Culture, blood (Routine x 2)     Status: Abnormal (Preliminary result)   Collection Time: 06/26/17  6:40 PM  Result Value Ref Range Status   Specimen Description BLOOD LEFT ANTECUBITAL  Final   Special Requests IN PEDIATRIC BOTTLE Blood Culture adequate volume  Final   Culture  Setup Time   Final    GRAM POSITIVE COCCI IN CLUSTERS IN PEDIATRIC BOTTLE CRITICAL RESULT CALLED TO, READ BACK BY AND VERIFIED WITH: E. JACKSON, RPHARMD ([place]) AT 3086 ON 06/27/17 BY C. JESSUP, MLT.    Culture (A)  Final    STAPHYLOCOCCUS AUREUS SUSCEPTIBILITIES TO FOLLOW Performed at [facility], [address], [place], [place] [address]    Report Status PENDING  Incomplete  Blood Culture ID Panel (Reflexed)     Status: Abnormal   Collection Time: 06/26/17  6:40 PM  Result Value Ref Range Status   Enterococcus species NOT DETECTED NOT DETECTED Final   Listeria monocytogenes NOT DETECTED NOT DETECTED Final   Staphylococcus species DETECTED (A) NOT DETECTED Final    Comment: CRITICAL RESULT CALLED TO, READ BACK BY AND VERIFIED WITH: E. JACKSON, RPHARMD ([place]) AT 9629 ON 06/27/17 BY C. JESSUP, MLT.    Staphylococcus aureus DETECTED (A) NOT DETECTED Final    Comment:  Methicillin (oxacillin) susceptible Staphylococcus aureus (MSSA). Preferred therapy is anti staphylococcal beta lactam antibiotic (Cefazolin or Nafcillin), unless clinically contraindicated. CRITICAL RESULT CALLED TO, READ BACK BY AND VERIFIED WITH: E. JACKSON, RPHARMD ([facility]) AT 5284 ON 06/27/17 BY C. JESSUP, MLT.    Methicillin resistance NOT DETECTED NOT DETECTED Final   Streptococcus species NOT DETECTED NOT DETECTED Final   Streptococcus agalactiae NOT DETECTED NOT DETECTED Final   Streptococcus pneumoniae NOT DETECTED NOT DETECTED Final   Streptococcus pyogenes NOT DETECTED NOT DETECTED Final   Acinetobacter baumannii NOT DETECTED NOT DETECTED Final   Enterobacteriaceae species NOT DETECTED NOT DETECTED Final   Enterobacter cloacae complex NOT DETECTED NOT DETECTED Final   Escherichia coli NOT DETECTED NOT DETECTED Final   Klebsiella oxytoca NOT DETECTED NOT DETECTED Final   Klebsiella pneumoniae NOT DETECTED NOT DETECTED  Final   Proteus species NOT DETECTED NOT DETECTED Final   Serratia marcescens NOT DETECTED NOT DETECTED Final   Haemophilus influenzae NOT DETECTED NOT DETECTED Final   Neisseria meningitidis NOT DETECTED NOT DETECTED Final   Pseudomonas aeruginosa NOT DETECTED NOT DETECTED Final   Candida albicans NOT DETECTED NOT DETECTED Final   Candida glabrata NOT DETECTED NOT DETECTED Final   Candida krusei NOT DETECTED NOT DETECTED Final   Candida parapsilosis NOT DETECTED NOT DETECTED Final   Candida tropicalis NOT DETECTED NOT DETECTED Final    Comment: Performed at [facility], [address], [place], [place] [address]  Culture, blood (Routine x 2)     Status: Abnormal (Preliminary result)   Collection Time: 06/26/17  6:50 PM  Result Value Ref Range Status   Specimen Description BLOOD RIGHT ANTECUBITAL  Final   Special Requests   Final    BOTTLES DRAWN AEROBIC AND ANAEROBIC Blood Culture results may not be optimal due to an inadequate volume of blood received in  culture bottles   Culture  Setup Time   Final    GRAM POSITIVE COCCI IN CLUSTERS IN BOTH AEROBIC AND ANAEROBIC BOTTLES CRITICAL VALUE NOTED.  VALUE IS CONSISTENT WITH PREVIOUSLY REPORTED AND CALLED VALUE.    Culture (A)  Final    STAPHYLOCOCCUS AUREUS SUSCEPTIBILITIES TO FOLLOW Performed at [facility], [address], [place], [place] [address]    Report Status PENDING  Incomplete  Culture, blood (single)     Status: Abnormal (Preliminary result)   Collection Time: 06/26/17  7:38 PM  Result Value Ref Range Status   Specimen Description BLOOD LEFT HAND  Final   Special Requests IN PEDIATRIC BOTTLE Blood Culture adequate volume  Final   Culture  Setup Time   Final    GRAM POSITIVE COCCI IN CLUSTERS IN PEDIATRIC BOTTLE CRITICAL VALUE NOTED.  VALUE IS CONSISTENT WITH PREVIOUSLY REPORTED AND CALLED VALUE.    Culture (A)  Final    STAPHYLOCOCCUS AUREUS SUSCEPTIBILITIES TO FOLLOW Performed at [facility], [address], [place], [place] [address]    Report Status PENDING  Incomplete  MRSA PCR Screening     Status: None   Collection Time: 06/27/17  1:12 AM  Result Value Ref Range Status   MRSA by PCR NEGATIVE NEGATIVE Final    Comment:        The GeneXpert MRSA Assay (FDA approved for NASAL specimens only), is one component of a comprehensive MRSA colonization surveillance program. It is not intended to diagnose MRSA infection nor to guide or monitor treatment for MRSA infections.          Radiology Studies: Dg Chest 2 View  Result Date: 06/26/2017 CLINICAL DATA:  Shortness of breath, fever. EXAM: CHEST  2 VIEW COMPARISON:  None. FINDINGS: The heart size and mediastinal contours are within normal limits. No pneumothorax or pleural effusion is noted. Left lung is clear. Mild right basilar subsegmental atelectasis is noted. The visualized skeletal structures are unremarkable. IMPRESSION: Mild right basilar subsegmental atelectasis. Electronically Signed   By:  Lupita Raider, M.D.   On: 06/26/2017 21:10        Scheduled Meds: . enoxaparin (LOVENOX) injection  40 mg Subcutaneous QHS  . methadone  30 mg Oral Daily   Continuous Infusions: . sodium chloride 150 mL/hr at 06/27/17 0603  .  ceFAZolin (ANCEF) IV 2 g (06/28/17 0600)     LOS: 2 days     Jacquelin Hawking,  MD [facility] 06/28/2017, 2:21 PM Pager: [phone]) [phone]  If 7PM-7AM, please contact night-coverage [email or web address] Password [email or web address] 06/28/2017, 2:21 PM

## 2017-06-28 NOTE — Transfer of Care (Signed)
Immediate Anesthesia Transfer of Care Note  Patient: Bryan Jennings  Procedure(s) Performed: Procedure(s): TRANSESOPHAGEAL ECHOCARDIOGRAM (TEE) WITH MAC (N/A)  Patient Location: PACU and Endoscopy Unit  Anesthesia Type:MAC  Level of Consciousness: awake and alert   Airway & Oxygen Therapy: Patient Spontanous Breathing  Post-op Assessment: Report given to RN  Post vital signs: Reviewed and stable  Last Vitals:  Vitals:   06/28/17 1245 06/28/17 1251  BP:    Pulse: 97 96  Resp:  18  Temp:    SpO2: 94% 98%    Last Pain:  Vitals:   06/28/17 1251  TempSrc: Oral  PainSc:       Patients Stated Pain Goal: 4 (39/03/00 9233)  Complications: No apparent anesthesia complications

## 2017-06-28 NOTE — CV Procedure (Signed)
Normal LV size diffuse hypokinesis EF 50% Normal MV/AV/PV Flail segment of tricuspid non septal leaflet. No vegetation severe TR May represent an old perforation  PFO present with positive bubble study No LAA thrombus No aortic debris Normal RV size No effusion   Bryan Jennings

## 2017-06-28 NOTE — Anesthesia Procedure Notes (Signed)
Procedure Name: MAC Date/Time: 06/28/2017 12:19 PM Performed by: Eligha Bridegroom Pre-anesthesia Checklist: Patient identified, Suction available, Emergency Drugs available, Patient being monitored and Timeout performed Patient Re-evaluated:Patient Re-evaluated prior to induction Oxygen Delivery Method: Nasal cannula Preoxygenation: Pre-oxygenation with 100% oxygen Induction Type: IV induction

## 2017-06-28 NOTE — Anesthesia Preprocedure Evaluation (Addendum)
Anesthesia Evaluation  Patient identified by MRN, date of birth, ID band Patient awake    Reviewed: Allergy & Precautions, H&P , NPO status , Patient's Chart, lab work & pertinent test results  Airway Mallampati: II  TM Distance: >3 FB Neck ROM: Full    Dental no notable dental hx. (+) Teeth Intact, Dental Advisory Given   Pulmonary Current Smoker,    Pulmonary exam normal breath sounds clear to auscultation       Cardiovascular hypertension, + Valvular Problems/Murmurs  Rhythm:Regular Rate:Normal     Neuro/Psych Depression negative neurological ROS  negative psych ROS   GI/Hepatic negative GI ROS, (+)     substance abuse  ,   Endo/Other  negative endocrine ROS  Renal/GU negative Renal ROS  negative genitourinary   Musculoskeletal   Abdominal   Peds  Hematology negative hematology ROS (+)   Anesthesia Other Findings   Reproductive/Obstetrics negative OB ROS                            Anesthesia Physical Anesthesia Plan  ASA: III  Anesthesia Plan: MAC   Post-op Pain Management:    Induction: Intravenous  PONV Risk Score and Plan: 0 and Propofol infusion  Airway Management Planned: Nasal Cannula  Additional Equipment:   Intra-op Plan:   Post-operative Plan:   Informed Consent: I have reviewed the patients History and Physical, chart, labs and discussed the procedure including the risks, benefits and alternatives for the proposed anesthesia with the patient or authorized representative who has indicated his/her understanding and acceptance.   Dental advisory given  Plan Discussed with: CRNA  Anesthesia Plan Comments:        Anesthesia Quick Evaluation

## 2017-06-29 DIAGNOSIS — B9561 Methicillin susceptible Staphylococcus aureus infection as the cause of diseases classified elsewhere: Secondary | ICD-10-CM

## 2017-06-29 DIAGNOSIS — I071 Rheumatic tricuspid insufficiency: Secondary | ICD-10-CM

## 2017-06-29 DIAGNOSIS — R7881 Bacteremia: Secondary | ICD-10-CM

## 2017-06-29 LAB — CULTURE, BLOOD (ROUTINE X 2): Special Requests: ADEQUATE

## 2017-06-29 LAB — CULTURE, BLOOD (SINGLE): SPECIAL REQUESTS: ADEQUATE

## 2017-06-29 NOTE — Progress Notes (Signed)
Patient ID: Xamir Rohlik, male   DOB: [birth year], 26 y.o.   MRN: [id number]          [facility]    Date of Admission:  06/26/2017   Total days of antibiotics 4        Day 3 cefazolin  Mr. Rome is now afebrile on therapy for MSSA bacteremia. There is no evidence for acute endocarditis on TEE. Repeat blood cultures are negative at 24 hours. I would hold off on PICC placement for now. He needs 10 more days of therapy.         Cliffton Asters, MD [facility] [phone] pager   [phone] cell 06/29/2017, 12:20 PM

## 2017-06-29 NOTE — Plan of Care (Signed)
Problem: Education: Goal: Knowledge of [facility] Education information/materials will improve Outcome: Completed/Met Date Met: 06/29/17 .  Problem: Safety: Goal: Ability to remain free from injury will improve Outcome: Progressing .  Problem: Health Behavior/Discharge Planning: Goal: Ability to manage health-related needs will improve Outcome: Progressing .  Problem: Pain Managment: Goal: General experience of comfort will improve Outcome: Progressing Denies pain at this time.    Problem: Physical Regulation: Goal: Ability to maintain clinical measurements within normal limits will improve Outcome: Progressing . Goal: Will remain free from infection Outcome: Progressing On IV antibiotics.

## 2017-06-29 NOTE — Progress Notes (Signed)
PROGRESS NOTE    Bryan Jennings  ZOX:[id number] DOB: [birth year] DOA: 06/26/2017 PCP: Patient, No Pcp Per   Brief Narrative: Bryan Jennings is a 26 y.o. 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 He presented with complaints of fever, chills, body aches in the setting of recent IV drug use. He was started on empiric antibiotics and was found to have Gram-Positive Cocci in clusters.   Assessment & Plan:   Principal Problem:   Sepsis due to Staphylococcus aureus [facility]) Active Problems:   Polysubstance dependence including opioid type drug, episodic abuse (HCC)   Essential hypertension   Severe tricuspid valve regurgitation   Bacteremia due to methicillin susceptible Staphylococcus aureus (MSSA)   Sepsis secondary to MSSA MSSA bacteremia Initial blood cultures (8/22) significant for MSSA. Secondary to IVDU. Echocardiogram was significant for an EF of 50% with mild diffuse hypokinesis and tricuspid regurgitation with evidence of partial flail of the tricuspid valve. TEE without evidence of vegetations. -ID recommendations: 2 weeks of IV cefazolin -Repeat blood cultures (8/24) pending  IVDU Patient last sees heparin via IV between 3 and 4 days ago. He reports shooting up in multiple sites. He states that he was also started on methadone recently. She has a recent admission involving recurrent MRSA chest wall/pleural space abscess -Continue methadone 30 mg daily  Essential hypertension Patient previously treated with metoprolol for which is not taking. Currently stable.  Tricuspid regurgitation Vegetation cannot be ruled out with TTE. TEE unremarkable for vegetations.   DVT prophylaxis: Lovenox Code Status: Full code Family Communication: None at bedside Disposition Plan: Discharge pending completion of antibiotic course   Consultants:   Infectious disease  Cardiology  Procedures:   None  Antimicrobials:  Vancomycin (8/22>>8/23)  Zosyn  (8/22>>8/23)  Cefazolin (8/23>>    Subjective: Afebrile. Wants to go home.  Objective: Vitals:   06/28/17 1350 06/28/17 1458 06/28/17 2058 06/29/17 0521  BP: 123/86 137/83 136/87 122/78  Pulse: 75 87 90 98  Resp: (!) 31  20 16   Temp:  98.5 F (36.9 C) 98.4 F (36.9 C) 98.6 F (37 C)  TempSrc:  Oral Oral Oral  SpO2: 96% 99% 98% 98%  Weight:      Height:        Intake/Output Summary (Last 24 hours) at 06/29/17 1135 Last data filed at 06/28/17 2200  Gross per 24 hour  Intake             3500 ml  Output                0 ml  Net             3500 ml   Filed Weights   06/26/17 1814 06/27/17 0059  Weight: 108.9 kg (240 lb) 98 kg (216 lb)    Examination:  General exam: Appears calm and comfortable Respiratory system: Clear to auscultation bilaterally. Unlabored work of breathing. No wheezing or rales. Cardiovascular system: Regular rate and rhythm. Normal S1 and S2. No heart murmurs present. No extra heart sounds Gastrointestinal system: Soft, non-tender, non-distended, no guarding, no rebound, no masses felt. Normal bowel sounds Central nervous system: Alert and oriented. No focal neurological deficits. Extremities: No edema. No calf tenderness Skin: No cyanosis. No rashes Psychiatry: Judgement and insight appear normal. Mood & affect appropriate.     Data Reviewed: I have personally reviewed following labs and imaging studies  CBC:  Recent Labs Lab 06/26/17 1948 06/27/17 0601  WBC 11.4* 10.0  NEUTROABS 9.8*  --  HGB 14.1 12.0*  HCT 39.7 35.9*  MCV 83.1 85.5  PLT 180 151   Basic Metabolic Panel:  Recent Labs Lab 06/26/17 1849 06/27/17 0601  NA 132* 136  K 4.5 3.3*  CL 97* 105  CO2 22 23  GLUCOSE 158* 115*  BUN 12 9  CREATININE 0.95 0.65  CALCIUM 9.1 8.3*   GFR: Estimated Creatinine Clearance: 167 mL/min (by C-G formula based on SCr of 0.65 mg/dL). Liver Function Tests:  Recent Labs Lab 06/26/17 1849 06/27/17 0601  AST 49* 25  ALT 36 27    ALKPHOS 65 43  BILITOT 1.8* 0.9  PROT 7.8 6.1*  ALBUMIN 3.6 2.7*   No results for input(s): LIPASE, AMYLASE in the last 168 hours. No results for input(s): AMMONIA in the last 168 hours. Coagulation Profile:  Recent Labs Lab 06/26/17 1849  INR 1.05   Cardiac Enzymes: No results for input(s): CKTOTAL, CKMB, CKMBINDEX, TROPONINI in the last 168 hours. BNP (last 3 results) No results for input(s): PROBNP in the last 8760 hours. HbA1C: No results for input(s): HGBA1C in the last 72 hours. CBG: No results for input(s): GLUCAP in the last 168 hours. Lipid Profile: No results for input(s): CHOL, HDL, LDLCALC, TRIG, CHOLHDL, LDLDIRECT in the last 72 hours. Thyroid Function Tests: No results for input(s): TSH, T4TOTAL, FREET4, T3FREE, THYROIDAB in the last 72 hours. Anemia Panel: No results for input(s): VITAMINB12, FOLATE, FERRITIN, TIBC, IRON, RETICCTPCT in the last 72 hours. Sepsis Labs:  Recent Labs Lab 06/26/17 1844 06/26/17 1945  LATICACIDVEN 3.66* 3.19*    Recent Results (from the past 240 hour(s))  Culture, blood (Routine x 2)     Status: Abnormal   Collection Time: 06/26/17  6:40 PM  Result Value Ref Range Status   Specimen Description BLOOD LEFT ANTECUBITAL  Final   Special Requests IN PEDIATRIC BOTTLE Blood Culture adequate volume  Final   Culture  Setup Time   Final    GRAM POSITIVE COCCI IN CLUSTERS IN PEDIATRIC BOTTLE CRITICAL RESULT CALLED TO, READ BACK BY AND VERIFIED WITH: E. JACKSON, RPHARMD ([place]) AT 6067 ON 06/27/17 BY C. JESSUP, MLT. Performed at [facility], [address] [place], [place] [address]    Culture STAPHYLOCOCCUS AUREUS (A)  Final   Report Status 06/29/2017 FINAL  Final   Organism ID, Bacteria STAPHYLOCOCCUS AUREUS  Final      Susceptibility   Staphylococcus aureus - MIC*    CIPROFLOXACIN <=0.5 SENSITIVE Sensitive     ERYTHROMYCIN >=8 RESISTANT Resistant     GENTAMICIN <=0.5 SENSITIVE Sensitive     OXACILLIN 0.5 SENSITIVE  Sensitive     TETRACYCLINE <=1 SENSITIVE Sensitive     VANCOMYCIN <=0.5 SENSITIVE Sensitive     TRIMETH/SULFA <=10 SENSITIVE Sensitive     CLINDAMYCIN <=0.25 SENSITIVE Sensitive     RIFAMPIN <=0.5 SENSITIVE Sensitive     Inducible Clindamycin NEGATIVE Sensitive     * STAPHYLOCOCCUS AUREUS  Blood Culture ID Panel (Reflexed)     Status: Abnormal   Collection Time: 06/26/17  6:40 PM  Result Value Ref Range Status   Enterococcus species NOT DETECTED NOT DETECTED Final   Listeria monocytogenes NOT DETECTED NOT DETECTED Final   Staphylococcus species DETECTED (A) NOT DETECTED Final    Comment: CRITICAL RESULT CALLED TO, READ BACK BY AND VERIFIED WITH: E. JACKSON, RPHARMD (WL) AT 3524 ON 06/27/17 BY C. JESSUP, MLT.    Staphylococcus aureus DETECTED (A) NOT DETECTED Final    Comment: Methicillin (oxacillin) susceptible  Staphylococcus aureus (MSSA). Preferred therapy is anti staphylococcal beta lactam antibiotic (Cefazolin or Nafcillin), unless clinically contraindicated. CRITICAL RESULT CALLED TO, READ BACK BY AND VERIFIED WITH: E. JACKSON, RPHARMD (WL) AT 0865 ON 06/27/17 BY C. JESSUP, MLT.    Methicillin resistance NOT DETECTED NOT DETECTED Final   Streptococcus species NOT DETECTED NOT DETECTED Final   Streptococcus agalactiae NOT DETECTED NOT DETECTED Final   Streptococcus pneumoniae NOT DETECTED NOT DETECTED Final   Streptococcus pyogenes NOT DETECTED NOT DETECTED Final   Acinetobacter baumannii NOT DETECTED NOT DETECTED Final   Enterobacteriaceae species NOT DETECTED NOT DETECTED Final   Enterobacter cloacae complex NOT DETECTED NOT DETECTED Final   Escherichia coli NOT DETECTED NOT DETECTED Final   Klebsiella oxytoca NOT DETECTED NOT DETECTED Final   Klebsiella pneumoniae NOT DETECTED NOT DETECTED Final   Proteus species NOT DETECTED NOT DETECTED Final   Serratia marcescens NOT DETECTED NOT DETECTED Final   Haemophilus influenzae NOT DETECTED NOT DETECTED Final   Neisseria  meningitidis NOT DETECTED NOT DETECTED Final   Pseudomonas aeruginosa NOT DETECTED NOT DETECTED Final   Candida albicans NOT DETECTED NOT DETECTED Final   Candida glabrata NOT DETECTED NOT DETECTED Final   Candida krusei NOT DETECTED NOT DETECTED Final   Candida parapsilosis NOT DETECTED NOT DETECTED Final   Candida tropicalis NOT DETECTED NOT DETECTED Final    Comment: Performed at [facility], [address] [place], [place] [address]  Culture, blood (Routine x 2)     Status: Abnormal   Collection Time: 06/26/17  6:50 PM  Result Value Ref Range Status   Specimen Description BLOOD RIGHT ANTECUBITAL  Final   Special Requests   Final    BOTTLES DRAWN AEROBIC AND ANAEROBIC Blood Culture results may not be optimal due to an inadequate volume of blood received in culture bottles   Culture  Setup Time   Final    GRAM POSITIVE COCCI IN CLUSTERS IN BOTH AEROBIC AND ANAEROBIC BOTTLES CRITICAL VALUE NOTED.  VALUE IS CONSISTENT WITH PREVIOUSLY REPORTED AND CALLED VALUE.    Culture (A)  Final    STAPHYLOCOCCUS AUREUS SUSCEPTIBILITIES PERFORMED ON PREVIOUS CULTURE WITHIN THE LAST 5 DAYS. Performed at [facility] Lab, [address], [place], [place] [address]    Report Status 06/29/2017 FINAL  Final  Culture, blood (single)     Status: Abnormal   Collection Time: 06/26/17  7:38 PM  Result Value Ref Range Status   Specimen Description BLOOD LEFT HAND  Final   Special Requests IN PEDIATRIC BOTTLE Blood Culture adequate volume  Final   Culture  Setup Time   Final    GRAM POSITIVE COCCI IN CLUSTERS IN PEDIATRIC BOTTLE CRITICAL VALUE NOTED.  VALUE IS CONSISTENT WITH PREVIOUSLY REPORTED AND CALLED VALUE.    Culture (A)  Final    STAPHYLOCOCCUS AUREUS SUSCEPTIBILITIES PERFORMED ON PREVIOUS CULTURE WITHIN THE LAST 5 DAYS. Performed at [facility], [address], [place], [place] [address]    Report Status 06/29/2017 FINAL  Final  MRSA PCR Screening     Status: None    Collection Time: 06/27/17  1:12 AM  Result Value Ref Range Status   MRSA by PCR NEGATIVE NEGATIVE Final    Comment:        The GeneXpert MRSA Assay (FDA approved for NASAL specimens only), is one component of a comprehensive MRSA colonization surveillance program. It is not intended to diagnose MRSA infection nor to guide or monitor treatment for MRSA infections.  Radiology Studies: No results found.      Scheduled Meds: . enoxaparin (LOVENOX) injection  40 mg Subcutaneous QHS  . methadone  30 mg Oral Daily   Continuous Infusions: . sodium chloride 150 mL/hr at 06/28/17 1651  .  ceFAZolin (ANCEF) IV Stopped (06/29/17 0556)     LOS: 3 days     Jacquelin Hawking, MD [facility] 06/29/2017, 11:35 AM Pager: [phone]  If 7PM-7AM, please contact night-coverage [email or web address] Password [facility] 06/29/2017, 11:35 AM

## 2017-06-30 NOTE — Progress Notes (Addendum)
PROGRESS NOTE    Bryan Jennings  ZOX:[id number] DOB: 1991-05-12 DOA: 06/26/2017 PCP: Patient, No Pcp Per   Brief Narrative: Bryan Jennings is a 26 y.o. 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 He presented with complaints of fever, chills, body aches in the setting of recent IV drug use. He was started on empiric antibiotics and was found to have Gram-Positive Cocci in clusters.   Assessment & Plan:   Principal Problem:   Sepsis due to Staphylococcus aureus [facility]) Active Problems:   Polysubstance dependence including opioid type drug, episodic abuse (HCC)   Essential hypertension   Severe tricuspid valve regurgitation   Bacteremia due to methicillin susceptible Staphylococcus aureus (MSSA)   Sepsis secondary to MSSA MSSA bacteremia Initial blood cultures (8/22) significant for MSSA. Secondary to IVDU. Echocardiogram was significant for an EF of 50% with mild diffuse hypokinesis and tricuspid regurgitation with evidence of partial flail of the tricuspid valve. TEE without evidence of vegetations. -ID recommendations: 2 weeks of IV cefazolin (End date: 07/10/17) -Repeat blood cultures (8/24) still pending (Called lab and lab not ever drawn because it was placed while patient was in endoscopy. Placing new order for repeat blood cultures (8/26)  IVDU Patient last sees heparin via IV between 3 and 4 days ago. He reports shooting up in multiple sites. He states that he was also started on methadone recently. She has a recent admission involving recurrent MRSA chest wall/pleural space abscess -Continue methadone 30 mg daily; may need to titrate up per methadone clinic recommendations.  Essential hypertension Patient previously treated with metoprolol for which is not taking. Currently stable.  Tricuspid regurgitation Vegetation cannot be ruled out with TTE. TEE unremarkable for vegetations.   DVT prophylaxis: Lovenox Code Status: Full code Family  Communication: None at bedside Disposition Plan: Discharge pending completion of antibiotic course   Consultants:   Infectious disease  Cardiology  Procedures:   Echocardiogram (8/23)  Transesophageal echocardiogram (8/24)  Antimicrobials:  Vancomycin (8/22>>8/23)  Zosyn (8/22>>8/23)  Cefazolin (8/23>>9/5   Subjective: Afebrile.  Objective: Vitals:   06/29/17 0521 06/29/17 1358 06/29/17 2050 06/30/17 0444  BP: 122/78 137/79 138/86 140/83  Pulse: 98 85 84 68  Resp: 16 18 18 18   Temp: 98.6 F (37 C) 99.3 F (37.4 C) 99.6 F (37.6 C) 98.7 F (37.1 C)  TempSrc: Oral Oral Oral Oral  SpO2: 98% 94% 98% 100%  Weight:      Height:        Intake/Output Summary (Last 24 hours) at 06/30/17 1113 Last data filed at 06/30/17 0948  Gross per 24 hour  Intake             8700 ml  Output             2676 ml  Net             6024 ml   Filed Weights   06/26/17 1814 06/27/17 0059  Weight: 108.9 kg (240 lb) 98 kg (216 lb)    Examination:  General exam: Appears calm and comfortable Respiratory system: Unlabored work of breathing. Central nervous system: Alert and oriented. No focal neurological deficits. Psychiatry: Judgement and insight appear normal. Mood & affect appropriate.     Data Reviewed: I have personally reviewed following labs and imaging studies  CBC:  Recent Labs Lab 06/26/17 1948 06/27/17 0601  WBC 11.4* 10.0  NEUTROABS 9.8*  --   HGB 14.1 12.0*  HCT 39.7 35.9*  MCV 83.1 85.5  PLT 180 151   Basic Metabolic Panel:  Recent Labs Lab 06/26/17 1849 06/27/17 0601  NA 132* 136  K 4.5 3.3*  CL 97* 105  CO2 22 23  GLUCOSE 158* 115*  BUN 12 9  CREATININE 0.95 0.65  CALCIUM 9.1 8.3*   GFR: Estimated Creatinine Clearance: 167 mL/min (by C-G formula based on SCr of 0.65 mg/dL). Liver Function Tests:  Recent Labs Lab 06/26/17 1849 06/27/17 0601  AST 49* 25  ALT 36 27  ALKPHOS 65 43  BILITOT 1.8* 0.9  PROT 7.8 6.1*  ALBUMIN 3.6 2.7*    No results for input(s): LIPASE, AMYLASE in the last 168 hours. No results for input(s): AMMONIA in the last 168 hours. Coagulation Profile:  Recent Labs Lab 06/26/17 1849  INR 1.05   Cardiac Enzymes: No results for input(s): CKTOTAL, CKMB, CKMBINDEX, TROPONINI in the last 168 hours. BNP (last 3 results) No results for input(s): PROBNP in the last 8760 hours. HbA1C: No results for input(s): HGBA1C in the last 72 hours. CBG: No results for input(s): GLUCAP in the last 168 hours. Lipid Profile: No results for input(s): CHOL, HDL, LDLCALC, TRIG, CHOLHDL, LDLDIRECT in the last 72 hours. Thyroid Function Tests: No results for input(s): TSH, T4TOTAL, FREET4, T3FREE, THYROIDAB in the last 72 hours. Anemia Panel: No results for input(s): VITAMINB12, FOLATE, FERRITIN, TIBC, IRON, RETICCTPCT in the last 72 hours. Sepsis Labs:  Recent Labs Lab 06/26/17 1844 06/26/17 1945  LATICACIDVEN 3.66* 3.19*    Recent Results (from the past 240 hour(s))  Culture, blood (Routine x 2)     Status: Abnormal   Collection Time: 06/26/17  6:40 PM  Result Value Ref Range Status   Specimen Description BLOOD LEFT ANTECUBITAL  Final   Special Requests IN PEDIATRIC BOTTLE Blood Culture adequate volume  Final   Culture  Setup Time   Final    GRAM POSITIVE COCCI IN CLUSTERS IN PEDIATRIC BOTTLE CRITICAL RESULT CALLED TO, READ BACK BY AND VERIFIED WITH: E. JACKSON, RPHARMD (WL) AT 4098 ON 06/27/17 BY C. JESSUP, MLT. Performed at [facility], [address], [place], [place] [address]    Culture STAPHYLOCOCCUS AUREUS (A)  Final   Report Status 06/29/2017 FINAL  Final   Organism ID, Bacteria STAPHYLOCOCCUS AUREUS  Final      Susceptibility   Staphylococcus aureus - MIC*    CIPROFLOXACIN <=0.5 SENSITIVE Sensitive     ERYTHROMYCIN >=8 RESISTANT Resistant     GENTAMICIN <=0.5 SENSITIVE Sensitive     OXACILLIN 0.5 SENSITIVE Sensitive     TETRACYCLINE <=1 SENSITIVE Sensitive     VANCOMYCIN <=0.5  SENSITIVE Sensitive     TRIMETH/SULFA <=10 SENSITIVE Sensitive     CLINDAMYCIN <=0.25 SENSITIVE Sensitive     RIFAMPIN <=0.5 SENSITIVE Sensitive     Inducible Clindamycin NEGATIVE Sensitive     * STAPHYLOCOCCUS AUREUS  Blood Culture ID Panel (Reflexed)     Status: Abnormal   Collection Time: 06/26/17  6:40 PM  Result Value Ref Range Status   Enterococcus species NOT DETECTED NOT DETECTED Final   Listeria monocytogenes NOT DETECTED NOT DETECTED Final   Staphylococcus species DETECTED (A) NOT DETECTED Final    Comment: CRITICAL RESULT CALLED TO, READ BACK BY AND VERIFIED WITH: E. JACKSON, RPHARMD (WL) AT 7829 ON 06/27/17 BY C. JESSUP, MLT.    Staphylococcus aureus DETECTED (A) NOT DETECTED Final    Comment: Methicillin (oxacillin) susceptible Staphylococcus aureus (MSSA). Preferred therapy is anti staphylococcal beta lactam antibiotic (Cefazolin or  Nafcillin), unless clinically contraindicated. CRITICAL RESULT CALLED TO, READ BACK BY AND VERIFIED WITH: E. JACKSON, RPHARMD (WL) AT 1898 ON 06/27/17 BY C. JESSUP, MLT.    Methicillin resistance NOT DETECTED NOT DETECTED Final   Streptococcus species NOT DETECTED NOT DETECTED Final   Streptococcus agalactiae NOT DETECTED NOT DETECTED Final   Streptococcus pneumoniae NOT DETECTED NOT DETECTED Final   Streptococcus pyogenes NOT DETECTED NOT DETECTED Final   Acinetobacter baumannii NOT DETECTED NOT DETECTED Final   Enterobacteriaceae species NOT DETECTED NOT DETECTED Final   Enterobacter cloacae complex NOT DETECTED NOT DETECTED Final   Escherichia coli NOT DETECTED NOT DETECTED Final   Klebsiella oxytoca NOT DETECTED NOT DETECTED Final   Klebsiella pneumoniae NOT DETECTED NOT DETECTED Final   Proteus species NOT DETECTED NOT DETECTED Final   Serratia marcescens NOT DETECTED NOT DETECTED Final   Haemophilus influenzae NOT DETECTED NOT DETECTED Final   Neisseria meningitidis NOT DETECTED NOT DETECTED Final   Pseudomonas aeruginosa NOT DETECTED  NOT DETECTED Final   Candida albicans NOT DETECTED NOT DETECTED Final   Candida glabrata NOT DETECTED NOT DETECTED Final   Candida krusei NOT DETECTED NOT DETECTED Final   Candida parapsilosis NOT DETECTED NOT DETECTED Final   Candida tropicalis NOT DETECTED NOT DETECTED Final    Comment: Performed at [facility], [address] [place], [place] [address]  Culture, blood (Routine x 2)     Status: Abnormal   Collection Time: 06/26/17  6:50 PM  Result Value Ref Range Status   Specimen Description BLOOD RIGHT ANTECUBITAL  Final   Special Requests   Final    BOTTLES DRAWN AEROBIC AND ANAEROBIC Blood Culture results may not be optimal due to an inadequate volume of blood received in culture bottles   Culture  Setup Time   Final    GRAM POSITIVE COCCI IN CLUSTERS IN BOTH AEROBIC AND ANAEROBIC BOTTLES CRITICAL VALUE NOTED.  VALUE IS CONSISTENT WITH PREVIOUSLY REPORTED AND CALLED VALUE.    Culture (A)  Final    STAPHYLOCOCCUS AUREUS SUSCEPTIBILITIES PERFORMED ON PREVIOUS CULTURE WITHIN THE LAST 5 DAYS. Performed at [facility], [address] [place], [place] [address]    Report Status 06/29/2017 FINAL  Final  Culture, blood (single)     Status: Abnormal   Collection Time: 06/26/17  7:38 PM  Result Value Ref Range Status   Specimen Description BLOOD LEFT HAND  Final   Special Requests IN PEDIATRIC BOTTLE Blood Culture adequate volume  Final   Culture  Setup Time   Final    GRAM POSITIVE COCCI IN CLUSTERS IN PEDIATRIC BOTTLE CRITICAL VALUE NOTED.  VALUE IS CONSISTENT WITH PREVIOUSLY REPORTED AND CALLED VALUE.    Culture (A)  Final    STAPHYLOCOCCUS AUREUS SUSCEPTIBILITIES PERFORMED ON PREVIOUS CULTURE WITHIN THE LAST 5 DAYS. Performed at [facility], [address] [place], [place] [address]    Report Status 06/29/2017 FINAL  Final  MRSA PCR Screening     Status: None   Collection Time: 06/27/17  1:12 AM  Result Value Ref Range Status   MRSA by PCR NEGATIVE  NEGATIVE Final    Comment:        The GeneXpert MRSA Assay (FDA approved for NASAL specimens only), is one component of a comprehensive MRSA colonization surveillance program. It is not intended to diagnose MRSA infection nor to guide or monitor treatment for MRSA infections.          Radiology Studies: No results  found.      Scheduled Meds: . enoxaparin (LOVENOX) injection  40 mg Subcutaneous QHS  . methadone  30 mg Oral Daily   Continuous Infusions: . sodium chloride 150 mL/hr (06/30/17 0610)  .  ceFAZolin (ANCEF) IV 2 g (06/30/17 0610)     LOS: 4 days     Jacquelin Hawking, MD [facility] 06/30/2017, 11:13 AM Pager: [phone]  If 7PM-7AM, please contact night-coverage [email or web address] Password [email or web address] 06/30/2017, 11:13 AM

## 2017-06-30 NOTE — Progress Notes (Signed)
Assumed care of the pat from off going RN. Condition stable at this time cont with plan of care. No changes in initial am assessment  

## 2017-07-01 ENCOUNTER — Encounter (HOSPITAL_COMMUNITY): Payer: Self-pay | Admitting: Cardiovascular Disease

## 2017-07-01 NOTE — Progress Notes (Signed)
    [facility]    Date of Admission:  06/26/2017   Total days of antibiotics 6         Day 5 cefazolin           ID: Bryan Jennings is a 26 y.o. male with  Principal Problem:   Sepsis due to Staphylococcus aureus [facility]) Active Problems:   Polysubstance dependence including opioid type drug, episodic abuse (HCC)   Essential hypertension   Severe tricuspid valve regurgitation   Bacteremia due to methicillin susceptible Staphylococcus aureus (MSSA)    Subjective: Afebrile, but still feeling symptoms of withdrawal/craving. Willing to stay til completion of abtx  Medications:  . enoxaparin (LOVENOX) injection  40 mg Subcutaneous QHS  . methadone  30 mg Oral Daily    Objective: Vital signs in last 24 hours: Temp:  [98.3 F (36.8 C)-98.8 F (37.1 C)] 98.8 F (37.1 C) (08/26 2216) Pulse Rate:  [56-61] 61 (08/26 2216) Resp:  [16-18] 18 (08/26 2216) BP: (136-138)/(83-93) 138/93 (08/26 2216) SpO2:  [96 %-98 %] 98 % (08/26 2216) Physical Exam  Constitutional: He is oriented to person, place, and time. He appears well-developed and well-nourished. No distress.  HENT:  Mouth/Throat: Oropharynx is clear and moist. No oropharyngeal exudate.  Cardiovascular: Normal rate, regular rhythm and normal heart sounds. Exam reveals no gallop and no friction rub.  No murmur heard.  Pulmonary/Chest: Effort normal and breath sounds normal. No respiratory distress. He has no wheezes.  Abdominal: Soft. Bowel sounds are normal. He exhibits no distension. There is no tenderness.  Lymphadenopathy:  He has no cervical adenopathy.  Neurological: He is alert and oriented to person, place, and time.  Skin: Skin is warm and dry. No rash noted. No erythema.  Psychiatric: He has a normal mood and affect. His behavior is normal.    Microbiology: 8/26 blood cx ngtd Studies/Results: No results found.   Assessment/Plan: mssa bacteremia = endocarditis has been ruled out. Needs 9  more days of tx. Continue on cefazolin 2gm IV q 8hr.  Methadone treatment = recommend to increase methadone to 35mg . Per guidelines, once patient has been on starting dose for 5-7 days and still has symptoms, can increase by 5-10mg . [facility] [place] who will take over once he is discharged.  [facility] Cell: [phone] Pager: [phone]  07/01/2017, 12:35 PM

## 2017-07-01 NOTE — Progress Notes (Addendum)
Patient ID: Bryan Jennings, male   DOB: April 23, 1991, 26 y.o.   MRN: [id number]                                                                PROGRESS NOTE                                                                                                                                                                                                             Patient Demographics:    Bryan Jennings, is a 26 y.o. male, DOB - 12-Jul-1991, WUJ:[id number]  Admit date - 06/26/2017   Admitting Physician Eston Esters, MD  Outpatient Primary MD for the patient is Bryan Jennings  LOS - 5  Outpatient Specialists:    Chief Complaint  Patient presents with  . Fever  . Tachycardia  . Hypertension       Brief Narrative   26 y.o. 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 He presented with complaints of fever, chills, body aches in the setting of recent IV drug use. He was started on empiric antibiotics and was found to have Gram-Positive Cocci in clusters.     Subjective:    Bryan Jennings today has no complaints overnite.  Afebrile.    No headache, No chest pain, No abdominal pain - No Nausea, No new weakness tingling or numbness, No Cough - SOB.    Assessment  & Plan :    Principal Problem:   Sepsis due to Staphylococcus aureus [facility]) Active Problems:   Polysubstance dependence including opioid type drug, episodic abuse (HCC)   Essential hypertension   Severe tricuspid valve regurgitation   Bacteremia due to methicillin susceptible Staphylococcus aureus (MSSA)   Sepsis secondary to MSSA MSSA bacteremia Initial blood cultures (8/22) significant for MSSA. Secondary to IVDU.  Echocardiogram was significant for an EF of 50% with mild diffuse hypokinesis and tricuspid regurgitation with evidence of partial flail of the tricuspid valve. TEE without evidence of vegetations. -Jennings ID no PICC -ID recommendations: 2 weeks of IV cefazolin (End date: 07/10/17) -Repeat  blood cultures (8/24) still pending (Called lab and lab not ever drawn because it was placed while patient was in endoscopy.  Repeat blood cultures (8/26) => pending  IVDU Patient last  heroin via IV  between 3 and 4 days ago (? Prior to [employer]. He reports shooting up in multiple sites. He states that he was also started on methadone recently. She has a recent admission involving recurrent MRSA chest wall/pleural space abscess -Continue methadone 30 mg daily; may need to titrate up Jennings methadone clinic recommendations.  Essential hypertension Patient previously treated with metoprolol for which is not taking. Currently stable.  Tricuspid regurgitation Vegetation cannot be ruled out with TTE. TEE unremarkable for vegetations.  Glucose intolerance (hga1c=5.8)  DVT prophylaxis: Lovenox Code Status: Full code Family Communication: None at bedside Disposition Plan: Discharge pending completion of antibiotic course   Consultants:   Infectious disease  Cardiology  Procedures:   Echocardiogram (8/23)  Transesophageal echocardiogram (8/24)  Antimicrobials:  Vancomycin (8/22>>8/23)  Zosyn (8/22>>8/23)  Cefazolin (8/23>>9/5     Lab Results  Component Value Date   PLT 151 06/27/2017      Anti-infectives    Start     Dose/Rate Route Frequency Ordered Stop   06/27/17 1800  ceFAZolin (ANCEF) IVPB 2g/100 mL premix     2 g 200 mL/hr over 30 Minutes Intravenous Every 8 hours 06/27/17 1128 07/10/17 2359   06/27/17 0600  vancomycin (VANCOCIN) IVPB 1000 mg/200 mL premix  Status:  Discontinued     1,000 mg 200 mL/hr over 60 Minutes Intravenous Every 8 hours 06/26/17 2306 06/27/17 1128   06/27/17 0200  piperacillin-tazobactam (ZOSYN) IVPB 3.375 g  Status:  Discontinued     3.375 g 12.5 mL/hr over 240 Minutes Intravenous Every 8 hours 06/26/17 2306 06/27/17 1128   06/26/17 1915  piperacillin-tazobactam (ZOSYN) IVPB 3.375 g     3.375 g 100 mL/hr over 30 Minutes  Intravenous  Once 06/26/17 1902 06/26/17 2050   06/26/17 1915  vancomycin (VANCOCIN) IVPB 1000 mg/200 mL premix  Status:  Discontinued     1,000 mg 200 mL/hr over 60 Minutes Intravenous  Once 06/26/17 1902 06/26/17 1908   06/26/17 1915  vancomycin (VANCOCIN) 2,000 mg in sodium chloride 0.9 % 500 mL IVPB     2,000 mg 250 mL/hr over 120 Minutes Intravenous  Once 06/26/17 1908 06/26/17 2313        Objective:   Vitals:   06/29/17 2050 06/30/17 0444 06/30/17 1344 06/30/17 2216  BP: 138/86 140/83 136/83 (!) 138/93  Pulse: 84 68 (!) 56 61  Resp: 18 18 16 18   Temp: 99.6 F (37.6 C) 98.7 F (37.1 C) 98.3 F (36.8 C) 98.8 F (37.1 C)  TempSrc: Oral Oral Oral Oral  SpO2: 98% 100% 96% 98%  Weight:      Height:        Wt Readings from Last 3 Encounters:  06/27/17 98 kg (216 lb)  01/02/17 115.4 kg (254 lb 6.4 oz)  12/09/16 114.4 kg (252 lb 1.6 oz)     Intake/Output Summary (Last 24 hours) at 07/01/17 0650 Last data filed at 07/01/17 0600  Gross Jennings 24 hour  Intake             1410 ml  Output              700 ml  Net              710 ml     Physical Exam  Awake Alert, Oriented X 3, No new F.N deficits, Normal affect [place].AT,PERRAL Supple Neck,No JVD, No cervical lymphadenopathy appriciated.  Symmetrical Chest wall movement, Good air movement bilaterally, CTAB rrr s1, s2, 2/6 sem llsb +ve B.Sounds, Abd Soft,  No tenderness, No organomegaly appriciated, No rebound - guarding or rigidity. No Cyanosis, Clubbing or edema, No new Rash or bruise   No janeway, no osler, no splinter.     Data Review:    CBC  Recent Labs Lab 06/26/17 1948 06/27/17 0601  WBC 11.4* 10.0  HGB 14.1 12.0*  HCT 39.7 35.9*  PLT 180 151  MCV 83.1 85.5  MCH 29.5 28.6  MCHC 35.5 33.4  RDW 12.3 12.8  LYMPHSABS 0.6*  --   MONOABS 1.0  --   EOSABS 0.0  --   BASOSABS 0.0  --     Chemistries   Recent Labs Lab 06/26/17 1849 06/27/17 0601  NA 132* 136  K 4.5 3.3*  CL 97* 105  CO2 22 23    GLUCOSE 158* 115*  BUN 12 9  CREATININE 0.95 0.65  CALCIUM 9.1 8.3*  AST 49* 25  ALT 36 27  ALKPHOS 65 43  BILITOT 1.8* 0.9   ------------------------------------------------------------------------------------------------------------------ No results for input(s): CHOL, HDL, LDLCALC, TRIG, CHOLHDL, LDLDIRECT in the last 72 hours.  Lab Results  Component Value Date   HGBA1C 5.8 (H) 08/29/2016   ------------------------------------------------------------------------------------------------------------------ No results for input(s): TSH, T4TOTAL, T3FREE, THYROIDAB in the last 72 hours.  Invalid input(s): FREET3 ------------------------------------------------------------------------------------------------------------------ No results for input(s): VITAMINB12, FOLATE, FERRITIN, TIBC, IRON, RETICCTPCT in the last 72 hours.  Coagulation profile  Recent Labs Lab 06/26/17 1849  INR 1.05    No results for input(s): DDIMER in the last 72 hours.  Cardiac Enzymes No results for input(s): CKMB, TROPONINI, MYOGLOBIN in the last 168 hours.  Invalid input(s): CK ------------------------------------------------------------------------------------------------------------------    Component Value Date/Time   BNP 81.9 08/21/2016 2023    Inpatient Medications  Scheduled Meds: . enoxaparin (LOVENOX) injection  40 mg Subcutaneous QHS  . methadone  30 mg Oral Daily   Continuous Infusions: .  ceFAZolin (ANCEF) IV Stopped (07/01/17 0615)   PRN Meds:.baclofen, LORazepam  Micro Results Recent Results (from the past 240 hour(s))  Culture, blood (Routine x 2)     Status: Abnormal   Collection Time: 06/26/17  6:40 PM  Result Value Ref Range Status   Specimen Description BLOOD LEFT ANTECUBITAL  Final   Special Requests IN PEDIATRIC BOTTLE Blood Culture adequate volume  Final   Culture  Setup Time   Final    GRAM POSITIVE COCCI IN CLUSTERS IN PEDIATRIC BOTTLE CRITICAL RESULT  CALLED TO, READ BACK BY AND VERIFIED WITH: E. JACKSON, RPHARMD ([place]) AT 1610 ON 06/27/17 BY C. JESSUP, MLT. Performed at [facility], [address], [place], [place] [address]    Culture STAPHYLOCOCCUS AUREUS (A)  Final   Report Status 06/29/2017 FINAL  Final   Organism ID, Bacteria STAPHYLOCOCCUS AUREUS  Final      Susceptibility   Staphylococcus aureus - MIC*    CIPROFLOXACIN <=0.5 SENSITIVE Sensitive     ERYTHROMYCIN >=8 RESISTANT Resistant     GENTAMICIN <=0.5 SENSITIVE Sensitive     OXACILLIN 0.5 SENSITIVE Sensitive     TETRACYCLINE <=1 SENSITIVE Sensitive     VANCOMYCIN <=0.5 SENSITIVE Sensitive     TRIMETH/SULFA <=10 SENSITIVE Sensitive     CLINDAMYCIN <=0.25 SENSITIVE Sensitive     RIFAMPIN <=0.5 SENSITIVE Sensitive     Inducible Clindamycin NEGATIVE Sensitive     * STAPHYLOCOCCUS AUREUS  Blood Culture ID Panel (Reflexed)     Status: Abnormal   Collection Time: 06/26/17  6:40 PM  Result Value Ref Range Status   Enterococcus species  NOT DETECTED NOT DETECTED Final   Listeria monocytogenes NOT DETECTED NOT DETECTED Final   Staphylococcus species DETECTED (A) NOT DETECTED Final    Comment: CRITICAL RESULT CALLED TO, READ BACK BY AND VERIFIED WITH: E. JACKSON, RPHARMD ([place]) AT 3976 ON 06/27/17 BY C. JESSUP, MLT.    Staphylococcus aureus DETECTED (A) NOT DETECTED Final    Comment: Methicillin (oxacillin) susceptible Staphylococcus aureus (MSSA). Preferred therapy is anti staphylococcal beta lactam antibiotic (Cefazolin or Nafcillin), unless clinically contraindicated. CRITICAL RESULT CALLED TO, READ BACK BY AND VERIFIED WITH: E. JACKSON, RPHARMD ([place]) AT 7341 ON 06/27/17 BY C. JESSUP, MLT.    Methicillin resistance NOT DETECTED NOT DETECTED Final   Streptococcus species NOT DETECTED NOT DETECTED Final   Streptococcus agalactiae NOT DETECTED NOT DETECTED Final   Streptococcus pneumoniae NOT DETECTED NOT DETECTED Final   Streptococcus pyogenes NOT DETECTED NOT DETECTED Final    Acinetobacter baumannii NOT DETECTED NOT DETECTED Final   Enterobacteriaceae species NOT DETECTED NOT DETECTED Final   Enterobacter cloacae complex NOT DETECTED NOT DETECTED Final   Escherichia coli NOT DETECTED NOT DETECTED Final   Klebsiella oxytoca NOT DETECTED NOT DETECTED Final   Klebsiella pneumoniae NOT DETECTED NOT DETECTED Final   Proteus species NOT DETECTED NOT DETECTED Final   Serratia marcescens NOT DETECTED NOT DETECTED Final   Haemophilus influenzae NOT DETECTED NOT DETECTED Final   Neisseria meningitidis NOT DETECTED NOT DETECTED Final   Pseudomonas aeruginosa NOT DETECTED NOT DETECTED Final   Candida albicans NOT DETECTED NOT DETECTED Final   Candida glabrata NOT DETECTED NOT DETECTED Final   Candida krusei NOT DETECTED NOT DETECTED Final   Candida parapsilosis NOT DETECTED NOT DETECTED Final   Candida tropicalis NOT DETECTED NOT DETECTED Final    Comment: Performed at [place] [facility], [address], [place], [place] [address]  Culture, blood (Routine x 2)     Status: Abnormal   Collection Time: 06/26/17  6:50 PM  Result Value Ref Range Status   Specimen Description BLOOD RIGHT ANTECUBITAL  Final   Special Requests   Final    BOTTLES DRAWN AEROBIC AND ANAEROBIC Blood Culture results may not be optimal due to an inadequate volume of blood received in culture bottles   Culture  Setup Time   Final    GRAM POSITIVE COCCI IN CLUSTERS IN BOTH AEROBIC AND ANAEROBIC BOTTLES CRITICAL VALUE NOTED.  VALUE IS CONSISTENT WITH PREVIOUSLY REPORTED AND CALLED VALUE.    Culture (A)  Final    STAPHYLOCOCCUS AUREUS SUSCEPTIBILITIES PERFORMED ON PREVIOUS CULTURE WITHIN THE LAST 5 DAYS. Performed at [facility], [address], [place], [place] [address]    Report Status 06/29/2017 FINAL  Final  Culture, blood (single)     Status: Abnormal   Collection Time: 06/26/17  7:38 PM  Result Value Ref Range Status   Specimen Description BLOOD LEFT HAND  Final   Special  Requests IN PEDIATRIC BOTTLE Blood Culture adequate volume  Final   Culture  Setup Time   Final    GRAM POSITIVE COCCI IN CLUSTERS IN PEDIATRIC BOTTLE CRITICAL VALUE NOTED.  VALUE IS CONSISTENT WITH PREVIOUSLY REPORTED AND CALLED VALUE.    Culture (A)  Final    STAPHYLOCOCCUS AUREUS SUSCEPTIBILITIES PERFORMED ON PREVIOUS CULTURE WITHIN THE LAST 5 DAYS. Performed at [facility], [address], [place], [place] [address]    Report Status 06/29/2017 FINAL  Final  MRSA PCR Screening     Status: None   Collection Time:  06/27/17  1:12 AM  Result Value Ref Range Status   MRSA by PCR NEGATIVE NEGATIVE Final    Comment:        The GeneXpert MRSA Assay (FDA approved for NASAL specimens only), is one component of a comprehensive MRSA colonization surveillance program. It is not intended to diagnose MRSA infection nor to guide or monitor treatment for MRSA infections.   Culture, blood (routine x 2)     Status: None (Preliminary result)   Collection Time: 06/30/17 11:50 AM  Result Value Ref Range Status   Specimen Description BLOOD RIGHT HAND  Final   Special Requests IN PEDIATRIC BOTTLE Blood Culture adequate volume  Final   Culture PENDING  Incomplete   Report Status PENDING  Incomplete    Radiology Reports Dg Chest 2 View  Result Date: 06/26/2017 CLINICAL DATA:  Shortness of breath, fever. EXAM: CHEST  2 VIEW COMPARISON:  None. FINDINGS: The heart size and mediastinal contours are within normal limits. No pneumothorax or pleural effusion is noted. Left lung is clear. Mild right basilar subsegmental atelectasis is noted. The visualized skeletal structures are unremarkable. IMPRESSION: Mild right basilar subsegmental atelectasis. Electronically Signed   By: Lupita Raider, M.D.   On: 06/26/2017 21:10    Time Spent in minutes  30   Pearson Grippe M.D on 07/01/2017 at 6:50 AM  Between 7am to 7pm - Pager - [phone]  After 7pm go to [email or web address] - password [facility]  [facility] -  Office  [phone]

## 2017-07-02 MED ORDER — METHADONE HCL 5 MG PO TABS
5.0000 mg | ORAL_TABLET | Freq: Once | ORAL | Status: AC
  Administered 2017-07-02: 5 mg via ORAL
  Filled 2017-07-02: qty 1

## 2017-07-02 MED ORDER — METHADONE HCL 5 MG PO TABS
35.0000 mg | ORAL_TABLET | Freq: Every day | ORAL | Status: DC
  Administered 2017-07-03 – 2017-07-04 (×2): 35 mg via ORAL
  Filled 2017-07-02 (×2): qty 7

## 2017-07-02 NOTE — Progress Notes (Signed)
PROGRESS NOTE    Bryan Jennings  ZOX:[id number] DOB: 12-14-1990 DOA: 06/26/2017 PCP: Patient, No Pcp Per   Brief Narrative: Bryan Jennings is a 26 y.o. 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 He presented with complaints of fever, chills, body aches in the setting of recent IV drug use. He was started on empiric antibiotics and was found to have Gram-Positive Cocci in clusters.   Assessment & Plan:   Principal Problem:   Sepsis due to Staphylococcus aureus [facility]) Active Problems:   Polysubstance dependence including opioid type drug, episodic abuse (HCC)   Essential hypertension   Severe tricuspid valve regurgitation   Bacteremia due to methicillin susceptible Staphylococcus aureus (MSSA)   Sepsis secondary to MSSA MSSA bacteremia Initial blood cultures (8/22) significant for MSSA. Secondary to IVDU. Echocardiogram was significant for an EF of 50% with mild diffuse hypokinesis and tricuspid regurgitation with evidence of partial flail of the tricuspid valve. TEE without evidence of vegetations. -ID recommendations: 2 weeks of IV cefazolin (End date: 07/10/17) -Repeat blood cultures (8/26) no growth to date  IVDU Patient last sees heparin via IV between 3 and 4 days prior to admission. He reports injecting in multiple sites. He states that he was also started on methadone recently. He has a recent admission involving recurrent MRSA chest wall/pleural space abscess -Increase to methadone 35 mg daily  Essential hypertension Patient previously treated with metoprolol for which is not taking. Currently stable.  Tricuspid regurgitation Vegetation could be ruled out with TTE. TEE unremarkable for vegetations.  Hypokalemia Replaced -Metabolic panel pending   DVT prophylaxis: Lovenox Code Status: Full code Family Communication: None at bedside Disposition Plan: Discharge pending completion of antibiotic course   Consultants:   Infectious  disease  Cardiology  Procedures:   Echocardiogram (8/23)  Transesophageal echocardiogram (8/24)  Antimicrobials:  Vancomycin (8/22>>8/23)  Zosyn (8/22>>8/23)  Cefazolin (8/23>>9/5   Subjective: Afebrile.  Objective: Vitals:   06/30/17 2216 07/01/17 1449 07/01/17 2057 07/02/17 0536  BP: (!) 138/93 (!) 158/82 139/84 130/73  Pulse: 61 (!) 55 62 60  Resp: 18 18 20 20   Temp: 98.8 F (37.1 C) 97.8 F (36.6 C) 98.1 F (36.7 C) 98 F (36.7 C)  TempSrc: Oral Oral Oral Oral  SpO2: 98% 100% 99% 96%  Weight:      Height:        Intake/Output Summary (Last 24 hours) at 07/02/17 1241 Last data filed at 07/02/17 0636  Gross per 24 hour  Intake              420 ml  Output                0 ml  Net              420 ml   Filed Weights   06/26/17 1814 06/27/17 0059  Weight: 108.9 kg (240 lb) 98 kg (216 lb)    Examination:  General exam: No distress Respiratory system: Unlabored work of breathing. Central nervous system: Alert and oriented. No focal neurological deficits. Psychiatry: Judgement and insight appear normal. Mood & affect appropriate.     Data Reviewed: I have personally reviewed following labs and imaging studies  CBC:  Recent Labs Lab 06/26/17 1948 06/27/17 0601  WBC 11.4* 10.0  NEUTROABS 9.8*  --   HGB 14.1 12.0*  HCT 39.7 35.9*  MCV 83.1 85.5  PLT 180 151   Basic Metabolic Panel:  Recent Labs Lab 06/26/17 1849 06/27/17 0601  NA 132* 136  K 4.5 3.3*  CL 97* 105  CO2 22 23  GLUCOSE 158* 115*  BUN 12 9  CREATININE 0.95 0.65  CALCIUM 9.1 8.3*   GFR: Estimated Creatinine Clearance: 167 mL/min (by C-G formula based on SCr of 0.65 mg/dL). Liver Function Tests:  Recent Labs Lab 06/26/17 1849 06/27/17 0601  AST 49* 25  ALT 36 27  ALKPHOS 65 43  BILITOT 1.8* 0.9  PROT 7.8 6.1*  ALBUMIN 3.6 2.7*   No results for input(s): LIPASE, AMYLASE in the last 168 hours. No results for input(s): AMMONIA in the last 168 hours. Coagulation  Profile:  Recent Labs Lab 06/26/17 1849  INR 1.05   Cardiac Enzymes: No results for input(s): CKTOTAL, CKMB, CKMBINDEX, TROPONINI in the last 168 hours. BNP (last 3 results) No results for input(s): PROBNP in the last 8760 hours. HbA1C: No results for input(s): HGBA1C in the last 72 hours. CBG: No results for input(s): GLUCAP in the last 168 hours. Lipid Profile: No results for input(s): CHOL, HDL, LDLCALC, TRIG, CHOLHDL, LDLDIRECT in the last 72 hours. Thyroid Function Tests: No results for input(s): TSH, T4TOTAL, FREET4, T3FREE, THYROIDAB in the last 72 hours. Anemia Panel: No results for input(s): VITAMINB12, FOLATE, FERRITIN, TIBC, IRON, RETICCTPCT in the last 72 hours. Sepsis Labs:  Recent Labs Lab 06/26/17 1844 06/26/17 1945  LATICACIDVEN 3.66* 3.19*    Recent Results (from the past 240 hour(s))  Culture, blood (Routine x 2)     Status: Abnormal   Collection Time: 06/26/17  6:40 PM  Result Value Ref Range Status   Specimen Description BLOOD LEFT ANTECUBITAL  Final   Special Requests IN PEDIATRIC BOTTLE Blood Culture adequate volume  Final   Culture  Setup Time   Final    GRAM POSITIVE COCCI IN CLUSTERS IN PEDIATRIC BOTTLE CRITICAL RESULT CALLED TO, READ BACK BY AND VERIFIED WITH: E. JACKSON, RPHARMD (WL) AT 1610 ON 06/27/17 BY C. JESSUP, MLT. Performed at [facility], [address], [place], [place] [address]    Culture STAPHYLOCOCCUS AUREUS (A)  Final   Report Status 06/29/2017 FINAL  Final   Organism ID, Bacteria STAPHYLOCOCCUS AUREUS  Final      Susceptibility   Staphylococcus aureus - MIC*    CIPROFLOXACIN <=0.5 SENSITIVE Sensitive     ERYTHROMYCIN >=8 RESISTANT Resistant     GENTAMICIN <=0.5 SENSITIVE Sensitive     OXACILLIN 0.5 SENSITIVE Sensitive     TETRACYCLINE <=1 SENSITIVE Sensitive     VANCOMYCIN <=0.5 SENSITIVE Sensitive     TRIMETH/SULFA <=10 SENSITIVE Sensitive     CLINDAMYCIN <=0.25 SENSITIVE Sensitive     RIFAMPIN <=0.5 SENSITIVE  Sensitive     Inducible Clindamycin NEGATIVE Sensitive     * STAPHYLOCOCCUS AUREUS  Blood Culture ID Panel (Reflexed)     Status: Abnormal   Collection Time: 06/26/17  6:40 PM  Result Value Ref Range Status   Enterococcus species NOT DETECTED NOT DETECTED Final   Listeria monocytogenes NOT DETECTED NOT DETECTED Final   Staphylococcus species DETECTED (A) NOT DETECTED Final    Comment: CRITICAL RESULT CALLED TO, READ BACK BY AND VERIFIED WITH: E. JACKSON, RPHARMD (WL) AT 4098 ON 06/27/17 BY C. JESSUP, MLT.    Staphylococcus aureus DETECTED (A) NOT DETECTED Final    Comment: Methicillin (oxacillin) susceptible Staphylococcus aureus (MSSA). Preferred therapy is anti staphylococcal beta lactam antibiotic (Cefazolin or Nafcillin), unless clinically contraindicated. CRITICAL RESULT CALLED TO, READ BACK BY AND VERIFIED WITH: Erling Cruz, RPHARMD (  WL) AT 1610 ON 06/27/17 BY C. JESSUP, MLT.    Methicillin resistance NOT DETECTED NOT DETECTED Final   Streptococcus species NOT DETECTED NOT DETECTED Final   Streptococcus agalactiae NOT DETECTED NOT DETECTED Final   Streptococcus pneumoniae NOT DETECTED NOT DETECTED Final   Streptococcus pyogenes NOT DETECTED NOT DETECTED Final   Acinetobacter baumannii NOT DETECTED NOT DETECTED Final   Enterobacteriaceae species NOT DETECTED NOT DETECTED Final   Enterobacter cloacae complex NOT DETECTED NOT DETECTED Final   Escherichia coli NOT DETECTED NOT DETECTED Final   Klebsiella oxytoca NOT DETECTED NOT DETECTED Final   Klebsiella pneumoniae NOT DETECTED NOT DETECTED Final   Proteus species NOT DETECTED NOT DETECTED Final   Serratia marcescens NOT DETECTED NOT DETECTED Final   Haemophilus influenzae NOT DETECTED NOT DETECTED Final   Neisseria meningitidis NOT DETECTED NOT DETECTED Final   Pseudomonas aeruginosa NOT DETECTED NOT DETECTED Final   Candida albicans NOT DETECTED NOT DETECTED Final   Candida glabrata NOT DETECTED NOT DETECTED Final   Candida  krusei NOT DETECTED NOT DETECTED Final   Candida parapsilosis NOT DETECTED NOT DETECTED Final   Candida tropicalis NOT DETECTED NOT DETECTED Final    Comment: Performed at [facility], [address] [place], [place] [address]  Culture, blood (Routine x 2)     Status: Abnormal   Collection Time: 06/26/17  6:50 PM  Result Value Ref Range Status   Specimen Description BLOOD RIGHT ANTECUBITAL  Final   Special Requests   Final    BOTTLES DRAWN AEROBIC AND ANAEROBIC Blood Culture results may not be optimal due to an inadequate volume of blood received in culture bottles   Culture  Setup Time   Final    GRAM POSITIVE COCCI IN CLUSTERS IN BOTH AEROBIC AND ANAEROBIC BOTTLES CRITICAL VALUE NOTED.  VALUE IS CONSISTENT WITH PREVIOUSLY REPORTED AND CALLED VALUE.    Culture (A)  Final    STAPHYLOCOCCUS AUREUS SUSCEPTIBILITIES PERFORMED ON PREVIOUS CULTURE WITHIN THE LAST 5 DAYS. Performed at [facility], [address], [place], [place] [address]    Report Status 06/29/2017 FINAL  Final  Culture, blood (single)     Status: Abnormal   Collection Time: 06/26/17  7:38 PM  Result Value Ref Range Status   Specimen Description BLOOD LEFT HAND  Final   Special Requests IN PEDIATRIC BOTTLE Blood Culture adequate volume  Final   Culture  Setup Time   Final    GRAM POSITIVE COCCI IN CLUSTERS IN PEDIATRIC BOTTLE CRITICAL VALUE NOTED.  VALUE IS CONSISTENT WITH PREVIOUSLY REPORTED AND CALLED VALUE.    Culture (A)  Final    STAPHYLOCOCCUS AUREUS SUSCEPTIBILITIES PERFORMED ON PREVIOUS CULTURE WITHIN THE LAST 5 DAYS. Performed at [facility], [address] [place], [place] [address]    Report Status 06/29/2017 FINAL  Final  MRSA PCR Screening     Status: None   Collection Time: 06/27/17  1:12 AM  Result Value Ref Range Status   MRSA by PCR NEGATIVE NEGATIVE Final    Comment:        The GeneXpert MRSA Assay (FDA approved for NASAL specimens only), is one component of  a comprehensive MRSA colonization surveillance program. It is not intended to diagnose MRSA infection nor to guide or monitor treatment for MRSA infections.   Culture, blood (routine x 2)     Status: None (Preliminary result)   Collection Time: 06/30/17 11:50 AM  Result Value Ref Range Status  Specimen Description BLOOD RIGHT HAND  Final   Special Requests IN PEDIATRIC BOTTLE Blood Culture adequate volume  Final   Culture   Final    NO GROWTH 2 DAYS Performed at [facility], [address], [place], [place] [address]    Report Status PENDING  Incomplete  Culture, blood (routine x 2)     Status: None (Preliminary result)   Collection Time: 06/30/17 11:50 AM  Result Value Ref Range Status   Specimen Description BLOOD LEFT ANTECUBITAL  Final   Special Requests IN PEDIATRIC BOTTLE Blood Culture adequate volume  Final   Culture   Final    NO GROWTH 2 DAYS Performed at [facility], [address], [place], [place] [address]    Report Status PENDING  Incomplete         Radiology Studies: No results found.      Scheduled Meds: . enoxaparin (LOVENOX) injection  40 mg Subcutaneous QHS  . [START ON 07/03/2017] methadone  35 mg Oral Daily   Continuous Infusions: .  ceFAZolin (ANCEF) IV Stopped (07/02/17 0706)     LOS: 6 days     Jacquelin Hawking, MD [facility] 07/02/2017, 12:41 PM Pager: [phone]  If 7PM-7AM, please contact night-coverage [email or web address] Password [facility] 07/02/2017, 12:41 PM

## 2017-07-02 NOTE — Plan of Care (Signed)
Problem: Health Behavior/Discharge Planning: Goal: Ability to manage health-related needs will improve Outcome: Progressing Continue.

## 2017-07-03 NOTE — Care Management Note (Signed)
Case Management Note  Patient Details  Name: Bryan Jennings MRN: [id number] Date of Birth: 1991-01-09  Subjective/Objective: 26 y/o m admitted w/Sepsis-MSSA bactermia. Hx: IVDA. ID-2 weeks iv abx to end on 07/10/17. CSW cons-polysubstance abuse.                    Action/Plan:d/c plan home.   Expected Discharge Date:                 Expected Discharge Plan:  Home/Self Care  In-House Referral:  Clinical Social Work  Discharge planning Services  CM Consult  Post Acute Care Choice:    Choice offered to:     DME Arranged:    DME Agency:     HH Arranged:    HH Agency:     Status of Service:  In process, will continue to follow  If discussed at Long Length of Stay Meetings, dates discussed:    Additional Comments:  Lanier Clam, RN 07/03/2017, 11:08 AM

## 2017-07-03 NOTE — Progress Notes (Addendum)
Patient ID: Aruther Herford, male   DOB: [birth year], 26 y.o.   MRN: [id number]  PROGRESS NOTE    Lara Hearst  KZL:[id number] DOB: 1990-12-12 DOA: 06/26/2017  PCP: Patient, No Pcp Per   Brief Narrative:  26 year old male with medical history of IVDA (heroin) and polysubstance abuse who presented to ED with fevers, chills, body aches in the setting of recent IVDA. He was found to have MSSA bacteremia, ID is following. Pt is on cefazolin and per ID end date is 07/10/2017.   Assessment & Plan:   Principal Problem:   Sepsis due to MSSA bacteremia (HCC) / Tricuspid regurgitation  - Endocarditis has been ruled out - Needs cefazolin 2 gm IV Q 8 hours through 07/10/2017 - Appreciate ID input   Active Problems:   Polysubstance dependence including opioid type drug, episodic abuse (HCC) - Pt is on methadone treatment, per ID, consider increase to 40 mg on Friday - [facility] [place]    Hypokalemia - Due to acute infection, sepsis - Supplemented    DVT prophylaxis: Lovenox subQ Code Status: full code  Family Communication: fiance at the bedside Disposition Plan: d/c once abx completed    Consultants:   ID  Cardiology  Procedures:   ECHO  TEE (8/24)  Antimicrobials:   Cefazolin - end date 07/10/2017  Vanco 8/22 --> 8/23  Zosyn 8/22 -->8/23   Subjective: No overnight events.   Objective: Vitals:   07/02/17 1523 07/02/17 2207 07/03/17 0607 07/03/17 1524  BP: 139/81 (!) 142/83 106/84 136/84  Pulse: 99 71 61 71  Resp: 20 20 18 17   Temp: 98.3 F (36.8 C) 98.3 F (36.8 C) 97.8 F (36.6 C) 98.1 F (36.7 C)  TempSrc: Oral Oral Oral Oral  SpO2: 97% 100% 96% 97%  Weight:      Height:        Intake/Output Summary (Last 24 hours) at 07/03/17 1712 Last data filed at 07/03/17 1600  Gross per 24 hour  Intake              760 ml  Output                0 ml  Net              760 ml   Filed Weights   06/26/17 1814 06/27/17 0059    Weight: 108.9 kg (240 lb) 98 kg (216 lb)    Examination:  General exam: Appears calm and comfortable  Respiratory system: Clear to auscultation. Respiratory effort normal. Cardiovascular system: S1 & S2 heard, RRR.  Gastrointestinal system: Abdomen is nondistended, soft and nontender. No organomegaly or masses felt. Normal bowel sounds heard. Central nervous system: Alert and oriented. No focal neurological deficits. Extremities: Symmetric 5 x 5 power. Skin: No rashes, lesions or ulcers Psychiatry: Judgement and insight appear normal. Mood & affect appropriate.   Data Reviewed: I have personally reviewed following labs and imaging studies  CBC:  Recent Labs Lab 06/26/17 1948 06/27/17 0601  WBC 11.4* 10.0  NEUTROABS 9.8*  --   HGB 14.1 12.0*  HCT 39.7 35.9*  MCV 83.1 85.5  PLT 180 151   Basic Metabolic Panel:  Recent Labs Lab 06/26/17 1849 06/27/17 0601  NA 132* 136  K 4.5 3.3*  CL 97* 105  CO2 22 23  GLUCOSE 158* 115*  BUN 12 9  CREATININE 0.95 0.65  CALCIUM 9.1 8.3*   GFR: Estimated Creatinine Clearance: 167 mL/min (by  C-G formula based on SCr of 0.65 mg/dL). Liver Function Tests:  Recent Labs Lab 06/26/17 1849 06/27/17 0601  AST 49* 25  ALT 36 27  ALKPHOS 65 43  BILITOT 1.8* 0.9  PROT 7.8 6.1*  ALBUMIN 3.6 2.7*   No results for input(s): LIPASE, AMYLASE in the last 168 hours. No results for input(s): AMMONIA in the last 168 hours. Coagulation Profile:  Recent Labs Lab 06/26/17 1849  INR 1.05   Cardiac Enzymes: No results for input(s): CKTOTAL, CKMB, CKMBINDEX, TROPONINI in the last 168 hours. BNP (last 3 results) No results for input(s): PROBNP in the last 8760 hours. HbA1C: No results for input(s): HGBA1C in the last 72 hours. CBG: No results for input(s): GLUCAP in the last 168 hours. Lipid Profile: No results for input(s): CHOL, HDL, LDLCALC, TRIG, CHOLHDL, LDLDIRECT in the last 72 hours. Thyroid Function Tests: No results for  input(s): TSH, T4TOTAL, FREET4, T3FREE, THYROIDAB in the last 72 hours. Anemia Panel: No results for input(s): VITAMINB12, FOLATE, FERRITIN, TIBC, IRON, RETICCTPCT in the last 72 hours. Urine analysis:    Component Value Date/Time   COLORURINE YELLOW 06/26/2017 2216   APPEARANCEUR CLEAR 06/26/2017 2216   LABSPEC 1.020 06/26/2017 2216   PHURINE 6.0 06/26/2017 2216   GLUCOSEU NEGATIVE 06/26/2017 2216   HGBUR SMALL (A) 06/26/2017 2216   BILIRUBINUR NEGATIVE 06/26/2017 2216   KETONESUR NEGATIVE 06/26/2017 2216   PROTEINUR 30 (A) 06/26/2017 2216   NITRITE NEGATIVE 06/26/2017 2216   LEUKOCYTESUR NEGATIVE 06/26/2017 2216   Sepsis Labs: @LABRCNTIP (procalcitonin:4,lacticidven:4)   ) Recent Results (from the past 240 hour(s))  Culture, blood (Routine x 2)     Status: Abnormal   Collection Time: 06/26/17  6:40 PM  Result Value Ref Range Status   Specimen Description BLOOD LEFT ANTECUBITAL  Final   Special Requests IN PEDIATRIC BOTTLE Blood Culture adequate volume  Final   Culture  Setup Time   Final    GRAM POSITIVE COCCI IN CLUSTERS IN PEDIATRIC BOTTLE CRITICAL RESULT CALLED TO, READ BACK BY AND VERIFIED WITH: E. JACKSON, RPHARMD (WL) AT 1610 ON 06/27/17 BY C. JESSUP, MLT. Performed at [facility], [address] [place], [place] [address]    Culture STAPHYLOCOCCUS AUREUS (A)  Final   Report Status 06/29/2017 FINAL  Final   Organism ID, Bacteria STAPHYLOCOCCUS AUREUS  Final      Susceptibility   Staphylococcus aureus - MIC*    CIPROFLOXACIN <=0.5 SENSITIVE Sensitive     ERYTHROMYCIN >=8 RESISTANT Resistant     GENTAMICIN <=0.5 SENSITIVE Sensitive     OXACILLIN 0.5 SENSITIVE Sensitive     TETRACYCLINE <=1 SENSITIVE Sensitive     VANCOMYCIN <=0.5 SENSITIVE Sensitive     TRIMETH/SULFA <=10 SENSITIVE Sensitive     CLINDAMYCIN <=0.25 SENSITIVE Sensitive     RIFAMPIN <=0.5 SENSITIVE Sensitive     Inducible Clindamycin NEGATIVE Sensitive     * STAPHYLOCOCCUS AUREUS  Blood  Culture ID Panel (Reflexed)     Status: Abnormal   Collection Time: 06/26/17  6:40 PM  Result Value Ref Range Status   Enterococcus species NOT DETECTED NOT DETECTED Final   Listeria monocytogenes NOT DETECTED NOT DETECTED Final   Staphylococcus species DETECTED (A) NOT DETECTED Final    Comment: CRITICAL RESULT CALLED TO, READ BACK BY AND VERIFIED WITH: E. JACKSON, RPHARMD (WL) AT 4098 ON 06/27/17 BY C. JESSUP, MLT.    Staphylococcus aureus DETECTED (A) NOT DETECTED Final    Comment: Methicillin (oxacillin) susceptible Staphylococcus aureus (MSSA).  Preferred therapy is anti staphylococcal beta lactam antibiotic (Cefazolin or Nafcillin), unless clinically contraindicated. CRITICAL RESULT CALLED TO, READ BACK BY AND VERIFIED WITH: E. JACKSON, RPHARMD (WL) AT 1610 ON 06/27/17 BY C. JESSUP, MLT.    Methicillin resistance NOT DETECTED NOT DETECTED Final   Streptococcus species NOT DETECTED NOT DETECTED Final   Streptococcus agalactiae NOT DETECTED NOT DETECTED Final   Streptococcus pneumoniae NOT DETECTED NOT DETECTED Final   Streptococcus pyogenes NOT DETECTED NOT DETECTED Final   Acinetobacter baumannii NOT DETECTED NOT DETECTED Final   Enterobacteriaceae species NOT DETECTED NOT DETECTED Final   Enterobacter cloacae complex NOT DETECTED NOT DETECTED Final   Escherichia coli NOT DETECTED NOT DETECTED Final   Klebsiella oxytoca NOT DETECTED NOT DETECTED Final   Klebsiella pneumoniae NOT DETECTED NOT DETECTED Final   Proteus species NOT DETECTED NOT DETECTED Final   Serratia marcescens NOT DETECTED NOT DETECTED Final   Haemophilus influenzae NOT DETECTED NOT DETECTED Final   Neisseria meningitidis NOT DETECTED NOT DETECTED Final   Pseudomonas aeruginosa NOT DETECTED NOT DETECTED Final   Candida albicans NOT DETECTED NOT DETECTED Final   Candida glabrata NOT DETECTED NOT DETECTED Final   Candida krusei NOT DETECTED NOT DETECTED Final   Candida parapsilosis NOT DETECTED NOT DETECTED Final    Candida tropicalis NOT DETECTED NOT DETECTED Final    Comment: Performed at [facility], [address] [place], [place] [address]  Culture, blood (Routine x 2)     Status: Abnormal   Collection Time: 06/26/17  6:50 PM  Result Value Ref Range Status   Specimen Description BLOOD RIGHT ANTECUBITAL  Final   Special Requests   Final    BOTTLES DRAWN AEROBIC AND ANAEROBIC Blood Culture results may not be optimal due to an inadequate volume of blood received in culture bottles   Culture  Setup Time   Final    GRAM POSITIVE COCCI IN CLUSTERS IN BOTH AEROBIC AND ANAEROBIC BOTTLES CRITICAL VALUE NOTED.  VALUE IS CONSISTENT WITH PREVIOUSLY REPORTED AND CALLED VALUE.    Culture (A)  Final    STAPHYLOCOCCUS AUREUS SUSCEPTIBILITIES PERFORMED ON PREVIOUS CULTURE WITHIN THE LAST 5 DAYS. Performed at [facility], [address], [place], [place] [address]    Report Status 06/29/2017 FINAL  Final  Culture, blood (single)     Status: Abnormal   Collection Time: 06/26/17  7:38 PM  Result Value Ref Range Status   Specimen Description BLOOD LEFT HAND  Final   Special Requests IN PEDIATRIC BOTTLE Blood Culture adequate volume  Final   Culture  Setup Time   Final    GRAM POSITIVE COCCI IN CLUSTERS IN PEDIATRIC BOTTLE CRITICAL VALUE NOTED.  VALUE IS CONSISTENT WITH PREVIOUSLY REPORTED AND CALLED VALUE.    Culture (A)  Final    STAPHYLOCOCCUS AUREUS SUSCEPTIBILITIES PERFORMED ON PREVIOUS CULTURE WITHIN THE LAST 5 DAYS. Performed at [facility], [address], [place], [place] [address]    Report Status 06/29/2017 FINAL  Final  MRSA PCR Screening     Status: None   Collection Time: 06/27/17  1:12 AM  Result Value Ref Range Status   MRSA by PCR NEGATIVE NEGATIVE Final    Comment:        The GeneXpert MRSA Assay (FDA approved for NASAL specimens only), is one component of a comprehensive MRSA colonization surveillance program. It is not intended to diagnose MRSA infection nor  to guide or monitor treatment for MRSA infections.   Culture,  blood (routine x 2)     Status: None (Preliminary result)   Collection Time: 06/30/17 11:50 AM  Result Value Ref Range Status   Specimen Description BLOOD RIGHT HAND  Final   Special Requests IN PEDIATRIC BOTTLE Blood Culture adequate volume  Final   Culture   Final    NO GROWTH 3 DAYS Performed at [facility], [address], [place], [place] [address]    Report Status PENDING  Incomplete  Culture, blood (routine x 2)     Status: None (Preliminary result)   Collection Time: 06/30/17 11:50 AM  Result Value Ref Range Status   Specimen Description BLOOD LEFT ANTECUBITAL  Final   Special Requests IN PEDIATRIC BOTTLE Blood Culture adequate volume  Final   Culture   Final    NO GROWTH 3 DAYS Performed at [facility], [address], [place], [place] [address]    Report Status PENDING  Incomplete      Radiology Studies: No results found.    Scheduled Meds: . enoxaparin (LOVENOX) injection  40 mg Subcutaneous QHS  . methadone  35 mg Oral Daily   Continuous Infusions: .  ceFAZolin (ANCEF) IV Stopped (07/03/17 1326)     LOS: 7 days    Time spent: 25 minutes  Greater than 50% of the time spent on counseling and coordinating the care.   Manson Passey, MD [facility] Pager [phone]  If 7PM-7AM, please contact night-coverage [email or web address] Password [facility] 07/03/2017, 5:12 PM

## 2017-07-03 NOTE — Progress Notes (Addendum)
    [facility]    Date of Admission:  06/26/2017   Total days of antibiotics 8         Day 7 cefazolin           ID: Bryan Jennings is a 26 y.o. male with  Principal Problem:   Sepsis due to Staphylococcus aureus [facility](HCC) Active Problems:   Polysubstance dependence including opioid type drug, episodic abuse (HCC)   Essential hypertension   Severe tricuspid valve regurgitation   Bacteremia due to methicillin susceptible Staphylococcus aureus (MSSA)    Subjective: Afebrile, but still feels symptoms of withdrawal towards early evening  Medications:  . enoxaparin (LOVENOX) injection  40 mg Subcutaneous QHS  . methadone  35 mg Oral Daily    Objective: Vital signs in last 24 hours: Temp:  [97.8 F (36.6 C)-98.3 F (36.8 C)] 98.1 F (36.7 C) (08/29 1524) Pulse Rate:  [61-71] 71 (08/29 1524) Resp:  [17-20] 17 (08/29 1524) BP: (106-142)/(83-84) 136/84 (08/29 1524) SpO2:  [96 %-100 %] 97 % (08/29 1524) Physical Exam  Constitutional: He is oriented to person, place, and time. He appears well-developed and well-nourished. No distress.  HENT:  Mouth/Throat: Oropharynx is clear and moist. No oropharyngeal exudate.  Cardiovascular: Normal rate, regular rhythm and normal heart sounds. Exam reveals no gallop and no friction rub.  No murmur heard.  Pulmonary/Chest: Effort normal and breath sounds normal. No respiratory distress. He has no wheezes.  Abdominal: Soft. Bowel sounds are normal. He exhibits no distension. There is no tenderness.  Lymphadenopathy:  He has no cervical adenopathy.  Neurological: He is alert and oriented to person, place, and time.  Skin: Skin is warm and dry. No rash noted. No erythema.  Psychiatric: He has a normal mood and affect. His behavior is normal.   Microbiology: 8/26 blood cx ngtd Studies/Results: No results found.   Assessment/Plan: mssa bacteremia = endocarditis has been ruled out. Needs 7 more days of tx. Continue on  cefazolin 2gm IV q 8hr. Finishes last dose IV abtx on Sep 5th  Methadone treatment = currently on methadone to 35mg . Consider increase to 40mg  on friday, which is still lower end of treatment doses.  [facility] with [facility] [place], would check with them to see if they would tell you their escalation protocol. Patient plants to check back with [facility] once discharge who will take over once he is discharged.  Will sign off   Drue SecondSNIDER, [facility] Cell: [phone] Pager: [phone]  07/03/2017, 3:54 PM

## 2017-07-04 LAB — RAPID URINE DRUG SCREEN, HOSP PERFORMED
AMPHETAMINES: NOT DETECTED
BENZODIAZEPINES: POSITIVE — AB
Barbiturates: NOT DETECTED
COCAINE: NOT DETECTED
Opiates: POSITIVE — AB
Tetrahydrocannabinol: NOT DETECTED

## 2017-07-04 NOTE — Progress Notes (Signed)
Patient refused morning lab draw, provider on call was notified. 

## 2017-07-04 NOTE — Progress Notes (Signed)
Patient ID: Bryan Jennings, male   DOB: 1991-09-04, 26 y.o.   MRN: [id number]  PROGRESS NOTE    Jazir Newey  WUJ:[id number] DOB: [birth year] DOA: 06/26/2017  PCP: Patient, No Pcp Per   Brief Narrative:  26 year old male with medical history of IVDA (heroin) and polysubstance abuse who presented to ED with fevers, chills, body aches in the setting of recent IVDA. He was found to have MSSA bacteremia, ID is following. Pt is on cefazolin and per ID end date is 07/10/2017.   Assessment & Plan:   Principal Problem:   Sepsis due to MSSA bacteremia (HCC) / Tricuspid regurgitation  - Endocarditis ruled out - Continue cefazolin through 07/10/2017 per ID recommendation  Active Problems:   Polysubstance dependence including opioid type drug, episodic abuse (HCC) - Pt is on methadone treatment, per ID, consider increase to 40 mg on Friday - [facility] is [facility] in [place] - Counseled on drug abuse - Will check UDS    Hypokalemia - Due to acute infection, sepsis - Potassium supplemented - Follow up BMP in am   DVT prophylaxis:Lovenox subQ Code Status: full code  Family Communication: fiance at the bedside  Disposition Plan: D/C once abx completed    Consultants:   ID  Cardiology  Procedures:   ECHO  TEE (8/24)  Antimicrobials:   Cefazolin - end date 07/10/2017  Vanco 8/22 --> 8/23  Zosyn 8/22 -->8/23   Subjective: No overnight events.  Objective: Vitals:   07/03/17 0607 07/03/17 1524 07/03/17 2042 07/04/17 0512  BP: 106/84 136/84 (!) 142/80 122/67  Pulse: 61 71 71 64  Resp: 18 17 18 18   Temp: 97.8 F (36.6 C) 98.1 F (36.7 C) 97.6 F (36.4 C) (!) 97.4 F (36.3 C)  TempSrc: Oral Oral Oral Oral  SpO2: 96% 97% 98% 95%  Weight:      Height:        Intake/Output Summary (Last 24 hours) at 07/04/17 0727 Last data filed at 07/03/17 1600  Gross per 24 hour  Intake              760 ml  Output                0 ml  Net              760  ml   Filed Weights   06/26/17 1814 06/27/17 0059  Weight: 108.9 kg (240 lb) 98 kg (216 lb)    Physical Exam  Constitutional: Appears well-developed and well-nourished. No distress.  CVS: RRR, S1/S2 +, no murmurs, no gallops, no carotid bruit.  Pulmonary: Effort and breath sounds normal, no stridor, rhonchi, wheezes, rales.  Abdominal: Soft. BS +,  no distension, tenderness, rebound or guarding.  Musculoskeletal: Normal range of motion. No edema and no tenderness.  Lymphadenopathy: No lymphadenopathy noted, cervical, inguinal. Neuro: Alert. Normal reflexes, muscle tone coordination. No cranial nerve deficit. Skin: Skin is warm and dry. No rash noted. Not diaphoretic. No erythema. No pallor.  Psychiatric: Normal mood and affect. Behavior, judgment, thought content normal.     Data Reviewed: I have personally reviewed following labs and imaging studies  CBC: No results for input(s): WBC, NEUTROABS, HGB, HCT, MCV, PLT in the last 168 hours. Basic Metabolic Panel: No results for input(s): NA, K, CL, CO2, GLUCOSE, BUN, CREATININE, CALCIUM, MG, PHOS in the last 168 hours. GFR: Estimated Creatinine Clearance: 167 mL/min (by C-G formula based on SCr of 0.65 mg/dL). Liver Function Tests:  No results for input(s): AST, ALT, ALKPHOS, BILITOT, PROT, ALBUMIN in the last 168 hours. No results for input(s): LIPASE, AMYLASE in the last 168 hours. No results for input(s): AMMONIA in the last 168 hours. Coagulation Profile: No results for input(s): INR, PROTIME in the last 168 hours. Cardiac Enzymes: No results for input(s): CKTOTAL, CKMB, CKMBINDEX, TROPONINI in the last 168 hours. BNP (last 3 results) No results for input(s): PROBNP in the last 8760 hours. HbA1C: No results for input(s): HGBA1C in the last 72 hours. CBG: No results for input(s): GLUCAP in the last 168 hours. Lipid Profile: No results for input(s): CHOL, HDL, LDLCALC, TRIG, CHOLHDL, LDLDIRECT in the last 72 hours. Thyroid  Function Tests: No results for input(s): TSH, T4TOTAL, FREET4, T3FREE, THYROIDAB in the last 72 hours. Anemia Panel: No results for input(s): VITAMINB12, FOLATE, FERRITIN, TIBC, IRON, RETICCTPCT in the last 72 hours. Urine analysis:    Component Value Date/Time   COLORURINE YELLOW 06/26/2017 2216   APPEARANCEUR CLEAR 06/26/2017 2216   LABSPEC 1.020 06/26/2017 2216   PHURINE 6.0 06/26/2017 2216   GLUCOSEU NEGATIVE 06/26/2017 2216   HGBUR SMALL (A) 06/26/2017 2216   BILIRUBINUR NEGATIVE 06/26/2017 2216   KETONESUR NEGATIVE 06/26/2017 2216   PROTEINUR 30 (A) 06/26/2017 2216   NITRITE NEGATIVE 06/26/2017 2216   LEUKOCYTESUR NEGATIVE 06/26/2017 2216   Sepsis Labs: @LABRCNTIP (procalcitonin:4,lacticidven:4)   ) Recent Results (from the past 240 hour(s))  Culture, blood (Routine x 2)     Status: Abnormal   Collection Time: 06/26/17  6:40 PM  Result Value Ref Range Status   Specimen Description BLOOD LEFT ANTECUBITAL  Final   Special Requests IN PEDIATRIC BOTTLE Blood Culture adequate volume  Final   Culture  Setup Time   Final    GRAM POSITIVE COCCI IN CLUSTERS IN PEDIATRIC BOTTLE CRITICAL RESULT CALLED TO, READ BACK BY AND VERIFIED WITH: E. JACKSON, RPHARMD ([place]) AT [id number] ON 06/27/17 BY C. JESSUP, MLT. Performed at [facility], [address] [place], [place] [address]    Culture STAPHYLOCOCCUS AUREUS (A)  Final   Report Status 06/29/2017 FINAL  Final   Organism ID, Bacteria STAPHYLOCOCCUS AUREUS  Final      Susceptibility   Staphylococcus aureus - MIC*    CIPROFLOXACIN <=0.5 SENSITIVE Sensitive     ERYTHROMYCIN >=8 RESISTANT Resistant     GENTAMICIN <=0.5 SENSITIVE Sensitive     OXACILLIN 0.5 SENSITIVE Sensitive     TETRACYCLINE <=1 SENSITIVE Sensitive     VANCOMYCIN <=0.5 SENSITIVE Sensitive     TRIMETH/SULFA <=10 SENSITIVE Sensitive     CLINDAMYCIN <=0.25 SENSITIVE Sensitive     RIFAMPIN <=0.5 SENSITIVE Sensitive     Inducible Clindamycin NEGATIVE Sensitive     *  STAPHYLOCOCCUS AUREUS  Blood Culture ID Panel (Reflexed)     Status: Abnormal   Collection Time: 06/26/17  6:40 PM  Result Value Ref Range Status   Enterococcus species NOT DETECTED NOT DETECTED Final   Listeria monocytogenes NOT DETECTED NOT DETECTED Final   Staphylococcus species DETECTED (A) NOT DETECTED Final    Comment: CRITICAL RESULT CALLED TO, READ BACK BY AND VERIFIED WITH: E. JACKSON, RPHARMD ([place]) AT [id number] ON 06/27/17 BY C. JESSUP, MLT.    Staphylococcus aureus DETECTED (A) NOT DETECTED Final    Comment: Methicillin (oxacillin) susceptible Staphylococcus aureus (MSSA). Preferred therapy is anti staphylococcal beta lactam antibiotic (Cefazolin or Nafcillin), unless clinically contraindicated. CRITICAL RESULT CALLED TO, READ BACK BY AND VERIFIED WITH: E. JACKSON, RPHARMD ([place]) AT [phone]  ON 06/27/17 BY C. JESSUP, MLT.    Methicillin resistance NOT DETECTED NOT DETECTED Final   Streptococcus species NOT DETECTED NOT DETECTED Final   Streptococcus agalactiae NOT DETECTED NOT DETECTED Final   Streptococcus pneumoniae NOT DETECTED NOT DETECTED Final   Streptococcus pyogenes NOT DETECTED NOT DETECTED Final   Acinetobacter baumannii NOT DETECTED NOT DETECTED Final   Enterobacteriaceae species NOT DETECTED NOT DETECTED Final   Enterobacter cloacae complex NOT DETECTED NOT DETECTED Final   Escherichia coli NOT DETECTED NOT DETECTED Final   Klebsiella oxytoca NOT DETECTED NOT DETECTED Final   Klebsiella pneumoniae NOT DETECTED NOT DETECTED Final   Proteus species NOT DETECTED NOT DETECTED Final   Serratia marcescens NOT DETECTED NOT DETECTED Final   Haemophilus influenzae NOT DETECTED NOT DETECTED Final   Neisseria meningitidis NOT DETECTED NOT DETECTED Final   Pseudomonas aeruginosa NOT DETECTED NOT DETECTED Final   Candida albicans NOT DETECTED NOT DETECTED Final   Candida glabrata NOT DETECTED NOT DETECTED Final   Candida krusei NOT DETECTED NOT DETECTED Final   Candida parapsilosis NOT  DETECTED NOT DETECTED Final   Candida tropicalis NOT DETECTED NOT DETECTED Final    Comment: Performed at [facility], [address] [place], [place] [address]  Culture, blood (Routine x 2)     Status: Abnormal   Collection Time: 06/26/17  6:50 PM  Result Value Ref Range Status   Specimen Description BLOOD RIGHT ANTECUBITAL  Final   Special Requests   Final    BOTTLES DRAWN AEROBIC AND ANAEROBIC Blood Culture results may not be optimal due to an inadequate volume of blood received in culture bottles   Culture  Setup Time   Final    GRAM POSITIVE COCCI IN CLUSTERS IN BOTH AEROBIC AND ANAEROBIC BOTTLES CRITICAL VALUE NOTED.  VALUE IS CONSISTENT WITH PREVIOUSLY REPORTED AND CALLED VALUE.    Culture (A)  Final    STAPHYLOCOCCUS AUREUS SUSCEPTIBILITIES PERFORMED ON PREVIOUS CULTURE WITHIN THE LAST 5 DAYS. Performed at [facility], [address], [place], [place] [address]    Report Status 06/29/2017 FINAL  Final  Culture, blood (single)     Status: Abnormal   Collection Time: 06/26/17  7:38 PM  Result Value Ref Range Status   Specimen Description BLOOD LEFT HAND  Final   Special Requests IN PEDIATRIC BOTTLE Blood Culture adequate volume  Final   Culture  Setup Time   Final    GRAM POSITIVE COCCI IN CLUSTERS IN PEDIATRIC BOTTLE CRITICAL VALUE NOTED.  VALUE IS CONSISTENT WITH PREVIOUSLY REPORTED AND CALLED VALUE.    Culture (A)  Final    STAPHYLOCOCCUS AUREUS SUSCEPTIBILITIES PERFORMED ON PREVIOUS CULTURE WITHIN THE LAST 5 DAYS. Performed at [facility], [address] [place], [place] [address]    Report Status 06/29/2017 FINAL  Final  MRSA PCR Screening     Status: None   Collection Time: 06/27/17  1:12 AM  Result Value Ref Range Status   MRSA by PCR NEGATIVE NEGATIVE Final    Comment:        The GeneXpert MRSA Assay (FDA approved for NASAL specimens only), is one component of a comprehensive MRSA colonization surveillance program. It is not intended  to diagnose MRSA infection nor to guide or monitor treatment for MRSA infections.   Culture, blood (routine x 2)     Status: None (Preliminary result)   Collection Time: 06/30/17 11:50 AM  Result Value Ref Range Status   Specimen Description BLOOD  RIGHT HAND  Final   Special Requests IN PEDIATRIC BOTTLE Blood Culture adequate volume  Final   Culture   Final    NO GROWTH 3 DAYS Performed at [facility], [address], [place], [place] [address]    Report Status PENDING  Incomplete  Culture, blood (routine x 2)     Status: None (Preliminary result)   Collection Time: 06/30/17 11:50 AM  Result Value Ref Range Status   Specimen Description BLOOD LEFT ANTECUBITAL  Final   Special Requests IN PEDIATRIC BOTTLE Blood Culture adequate volume  Final   Culture   Final    NO GROWTH 3 DAYS Performed at [facility], [address] [place], [place] [address]    Report Status PENDING  Incomplete      Radiology Studies: No results found.    Scheduled Meds: . enoxaparin (LOVENOX) injection  40 mg Subcutaneous QHS  . methadone  35 mg Oral Daily   Continuous Infusions: .  ceFAZolin (ANCEF) IV 2 g (07/04/17 0704)     LOS: 8 days    Time spent: 15 minutes  Greater than 50% of the time spent on counseling and coordinating the care.   Manson Passey, MD [facility] Pager [phone]  If 7PM-7AM, please contact night-coverage [email or web address] Password [email or web address] 07/04/2017, 7:27 AM

## 2017-07-05 LAB — BASIC METABOLIC PANEL
Anion gap: 7 (ref 5–15)
BUN: 10 mg/dL (ref 6–20)
CHLORIDE: 102 mmol/L (ref 101–111)
CO2: 29 mmol/L (ref 22–32)
CREATININE: 0.77 mg/dL (ref 0.61–1.24)
Calcium: 9.2 mg/dL (ref 8.9–10.3)
GFR calc Af Amer: >60 mL/min (ref >60)
GFR calc non Af Amer: >60 mL/min (ref >60)
GLUCOSE: 96 mg/dL (ref 65–99)
Potassium: 4.5 mmol/L (ref 3.5–5.1)
SODIUM: 138 mmol/L (ref 135–145)

## 2017-07-05 LAB — CBC
HCT: 40.6 % (ref 39.0–52.0)
HEMOGLOBIN: 13.6 g/dL (ref 13.0–17.0)
MCH: 28.8 pg (ref 26.0–34.0)
MCHC: 33.5 g/dL (ref 30.0–36.0)
MCV: 85.8 fL (ref 78.0–100.0)
Platelets: 387 10*3/uL (ref 150–400)
RBC: 4.73 MIL/uL (ref 4.22–5.81)
RDW: 13 % (ref 11.5–15.5)
WBC: 7.7 10*3/uL (ref 4.0–10.5)

## 2017-07-05 LAB — CULTURE, BLOOD (ROUTINE X 2)
CULTURE: NO GROWTH
CULTURE: NO GROWTH
SPECIAL REQUESTS: ADEQUATE
Special Requests: ADEQUATE

## 2017-07-05 MED ORDER — METHADONE HCL 5 MG PO TABS
40.0000 mg | ORAL_TABLET | Freq: Every day | ORAL | Status: DC
  Administered 2017-07-05 – 2017-07-11 (×7): 40 mg via ORAL
  Filled 2017-07-05 (×7): qty 8

## 2017-07-05 NOTE — Progress Notes (Signed)
Patient ID: Bryan Jennings, male   DOB: 09-Aug-1991, 26 y.o.   MRN: [id number]  PROGRESS NOTE    Bryan Jennings  WUJ:[id number] DOB: 09-Aug-1991 DOA: 06/26/2017  PCP: Patient, No Pcp Per   Brief Narrative:  [age] year old male with medical history of IVDA (heroin) and polysubstance abuse who presented to ED with fevers, chills, body aches in the setting of recent IVDA. He was found to have MSSA bacteremia, ID is following. Pt is on cefazolin and per ID end date is 07/10/2017.  Assessment & Plan:   Principal Problem:   Sepsis due to MSSA bacteremia (HCC) / Tricuspid regurgitation  - Endocarditis ruled out - continue cefazolin through 07/10/2017  Active Problems:   Polysubstance dependence including opioid type drug, episodic abuse (HCC) - Pt is on methadone treatment, per ID, consider increase to 40 mg on Friday - [facility] [place] - Patient was counseled on drug abuse - UDS 8/30 positive for opiates and benzos     Hypokalemia - Due to acute infection, sepsis - Potassium supplemented and WNL   DVT prophylaxis: Lovenox subQ Code Status: full code  Family Communication: fiance at the bedside Disposition Plan: home 9/5 once completes abx    Consultants:   ID  Cardio  Procedures:   ECHO  TEE (8/24)  Antimicrobials:   Cefazolin - end date 07/10/2017  Vanco 8/22 --> 8/23  Zosyn 8/22 -->8/23   Subjective: No overnight events.   Objective: Vitals:   07/03/17 2042 07/04/17 0512 07/04/17 2008 07/05/17 0607  BP: (!) 142/80 122/67 (!) 141/87 111/64  Pulse: 71 64 83 71  Resp: 18 18 19 19   Temp: 97.6 F (36.4 C) (!) 97.4 F (36.3 C) 98.1 F (36.7 C) 97.9 F (36.6 C)  TempSrc: Oral Oral Oral Oral  SpO2: 98% 95% 100% 98%  Weight:      Height:        Intake/Output Summary (Last 24 hours) at 07/05/17 0856 Last data filed at 07/05/17 0200  Gross per 24 hour  Intake              640 ml  Output                0 ml  Net               640 ml   Filed Weights   06/26/17 1814 06/27/17 0059  Weight: 108.9 kg (240 lb) 98 kg (216 lb)    Examination:  General exam: Appears calm and comfortable  Respiratory system: Clear to auscultation. Respiratory effort normal. Cardiovascular system: S1 & S2 heard, RRR. No JVD, murmurs, rubs, gallops or clicks. No pedal edema. Gastrointestinal system: Abdomen is nondistended, soft and nontender. No organomegaly or masses felt. Normal bowel sounds heard. Central nervous system: Alert and oriented. No focal neurological deficits. Extremities: Symmetric 5 x 5 power. Skin: No rashes, lesions or ulcers Psychiatry: Judgement and insight appear normal. Mood & affect appropriate.   Data Reviewed: I have personally reviewed following labs and imaging studies  CBC:  Recent Labs Lab 07/05/17 0612  WBC 7.7  HGB 13.6  HCT 40.6  MCV 85.8  PLT 387   Basic Metabolic Panel:  Recent Labs Lab 07/05/17 0612  NA 138  K 4.5  CL 102  CO2 29  GLUCOSE 96  BUN 10  CREATININE 0.77  CALCIUM 9.2   GFR: Estimated Creatinine Clearance: 167 mL/min (by C-G formula based on SCr of 0.77 mg/dL).  Liver Function Tests: No results for input(s): AST, ALT, ALKPHOS, BILITOT, PROT, ALBUMIN in the last 168 hours. No results for input(s): LIPASE, AMYLASE in the last 168 hours. No results for input(s): AMMONIA in the last 168 hours. Coagulation Profile: No results for input(s): INR, PROTIME in the last 168 hours. Cardiac Enzymes: No results for input(s): CKTOTAL, CKMB, CKMBINDEX, TROPONINI in the last 168 hours. BNP (last 3 results) No results for input(s): PROBNP in the last 8760 hours. HbA1C: No results for input(s): HGBA1C in the last 72 hours. CBG: No results for input(s): GLUCAP in the last 168 hours. Lipid Profile: No results for input(s): CHOL, HDL, LDLCALC, TRIG, CHOLHDL, LDLDIRECT in the last 72 hours. Thyroid Function Tests: No results for input(s): TSH, T4TOTAL, FREET4, T3FREE,  THYROIDAB in the last 72 hours. Anemia Panel: No results for input(s): VITAMINB12, FOLATE, FERRITIN, TIBC, IRON, RETICCTPCT in the last 72 hours. Urine analysis:    Component Value Date/Time   COLORURINE YELLOW 06/26/2017 2216   APPEARANCEUR CLEAR 06/26/2017 2216   LABSPEC 1.020 06/26/2017 2216   PHURINE 6.0 06/26/2017 2216   GLUCOSEU NEGATIVE 06/26/2017 2216   HGBUR SMALL (A) 06/26/2017 2216   BILIRUBINUR NEGATIVE 06/26/2017 2216   KETONESUR NEGATIVE 06/26/2017 2216   PROTEINUR 30 (A) 06/26/2017 2216   NITRITE NEGATIVE 06/26/2017 2216   LEUKOCYTESUR NEGATIVE 06/26/2017 2216   Sepsis Labs: @LABRCNTIP (procalcitonin:4,lacticidven:4)   ) Recent Results (from the past 240 hour(s))  Culture, blood (Routine x 2)     Status: Abnormal   Collection Time: 06/26/17  6:40 PM  Result Value Ref Range Status   Specimen Description BLOOD LEFT ANTECUBITAL  Final   Special Requests IN PEDIATRIC BOTTLE Blood Culture adequate volume  Final   Culture  Setup Time   Final    GRAM POSITIVE COCCI IN CLUSTERS IN PEDIATRIC BOTTLE CRITICAL RESULT CALLED TO, READ BACK BY AND VERIFIED WITH: E. JACKSON, RPHARMD (WL) AT 0454 ON 06/27/17 BY C. JESSUP, MLT. Performed at [facility], [address], [place], [place] [address]    Culture STAPHYLOCOCCUS AUREUS (A)  Final   Report Status 06/29/2017 FINAL  Final   Organism ID, Bacteria STAPHYLOCOCCUS AUREUS  Final      Susceptibility   Staphylococcus aureus - MIC*    CIPROFLOXACIN <=0.5 SENSITIVE Sensitive     ERYTHROMYCIN >=8 RESISTANT Resistant     GENTAMICIN <=0.5 SENSITIVE Sensitive     OXACILLIN 0.5 SENSITIVE Sensitive     TETRACYCLINE <=1 SENSITIVE Sensitive     VANCOMYCIN <=0.5 SENSITIVE Sensitive     TRIMETH/SULFA <=10 SENSITIVE Sensitive     CLINDAMYCIN <=0.25 SENSITIVE Sensitive     RIFAMPIN <=0.5 SENSITIVE Sensitive     Inducible Clindamycin NEGATIVE Sensitive     * STAPHYLOCOCCUS AUREUS  Blood Culture ID Panel (Reflexed)     Status:  Abnormal   Collection Time: 06/26/17  6:40 PM  Result Value Ref Range Status   Enterococcus species NOT DETECTED NOT DETECTED Final   Listeria monocytogenes NOT DETECTED NOT DETECTED Final   Staphylococcus species DETECTED (A) NOT DETECTED Final    Comment: CRITICAL RESULT CALLED TO, READ BACK BY AND VERIFIED WITH: E. JACKSON, RPHARMD (WL) AT 9147 ON 06/27/17 BY C. JESSUP, MLT.    Staphylococcus aureus DETECTED (A) NOT DETECTED Final    Comment: Methicillin (oxacillin) susceptible Staphylococcus aureus (MSSA). Preferred therapy is anti staphylococcal beta lactam antibiotic (Cefazolin or Nafcillin), unless clinically contraindicated. CRITICAL RESULT CALLED TO, READ BACK BY AND VERIFIED WITH: Erling Cruz, RPHARMD (  WL) AT 1610 ON 06/27/17 BY C. JESSUP, MLT.    Methicillin resistance NOT DETECTED NOT DETECTED Final   Streptococcus species NOT DETECTED NOT DETECTED Final   Streptococcus agalactiae NOT DETECTED NOT DETECTED Final   Streptococcus pneumoniae NOT DETECTED NOT DETECTED Final   Streptococcus pyogenes NOT DETECTED NOT DETECTED Final   Acinetobacter baumannii NOT DETECTED NOT DETECTED Final   Enterobacteriaceae species NOT DETECTED NOT DETECTED Final   Enterobacter cloacae complex NOT DETECTED NOT DETECTED Final   Escherichia coli NOT DETECTED NOT DETECTED Final   Klebsiella oxytoca NOT DETECTED NOT DETECTED Final   Klebsiella pneumoniae NOT DETECTED NOT DETECTED Final   Proteus species NOT DETECTED NOT DETECTED Final   Serratia marcescens NOT DETECTED NOT DETECTED Final   Haemophilus influenzae NOT DETECTED NOT DETECTED Final   Neisseria meningitidis NOT DETECTED NOT DETECTED Final   Pseudomonas aeruginosa NOT DETECTED NOT DETECTED Final   Candida albicans NOT DETECTED NOT DETECTED Final   Candida glabrata NOT DETECTED NOT DETECTED Final   Candida krusei NOT DETECTED NOT DETECTED Final   Candida parapsilosis NOT DETECTED NOT DETECTED Final   Candida tropicalis NOT DETECTED NOT  DETECTED Final    Comment: Performed at [facility], [address], [place], [place] [address]  Culture, blood (Routine x 2)     Status: Abnormal   Collection Time: 06/26/17  6:50 PM  Result Value Ref Range Status   Specimen Description BLOOD RIGHT ANTECUBITAL  Final   Special Requests   Final    BOTTLES DRAWN AEROBIC AND ANAEROBIC Blood Culture results may not be optimal due to an inadequate volume of blood received in culture bottles   Culture  Setup Time   Final    GRAM POSITIVE COCCI IN CLUSTERS IN BOTH AEROBIC AND ANAEROBIC BOTTLES CRITICAL VALUE NOTED.  VALUE IS CONSISTENT WITH PREVIOUSLY REPORTED AND CALLED VALUE.    Culture (A)  Final    STAPHYLOCOCCUS AUREUS SUSCEPTIBILITIES PERFORMED ON PREVIOUS CULTURE WITHIN THE LAST 5 DAYS. Performed at [facility], [address], [place], [place] [address]    Report Status 06/29/2017 FINAL  Final  Culture, blood (single)     Status: Abnormal   Collection Time: 06/26/17  7:38 PM  Result Value Ref Range Status   Specimen Description BLOOD LEFT HAND  Final   Special Requests IN PEDIATRIC BOTTLE Blood Culture adequate volume  Final   Culture  Setup Time   Final    GRAM POSITIVE COCCI IN CLUSTERS IN PEDIATRIC BOTTLE CRITICAL VALUE NOTED.  VALUE IS CONSISTENT WITH PREVIOUSLY REPORTED AND CALLED VALUE.    Culture (A)  Final    STAPHYLOCOCCUS AUREUS SUSCEPTIBILITIES PERFORMED ON PREVIOUS CULTURE WITHIN THE LAST 5 DAYS. Performed at [facility], [address] [place], [place] [address]    Report Status 06/29/2017 FINAL  Final  MRSA PCR Screening     Status: None   Collection Time: 06/27/17  1:12 AM  Result Value Ref Range Status   MRSA by PCR NEGATIVE NEGATIVE Final    Comment:        The GeneXpert MRSA Assay (FDA approved for NASAL specimens only), is one component of a comprehensive MRSA colonization surveillance program. It is not intended to diagnose MRSA infection nor to guide or monitor treatment  for MRSA infections.   Culture, blood (routine x 2)     Status: None (Preliminary result)   Collection Time: 06/30/17 11:50 AM  Result Value Ref Range Status  Specimen Description BLOOD RIGHT HAND  Final   Special Requests IN PEDIATRIC BOTTLE Blood Culture adequate volume  Final   Culture   Final    NO GROWTH 4 DAYS Performed at [facility], [address] [place], [place] [address]    Report Status PENDING  Incomplete  Culture, blood (routine x 2)     Status: None (Preliminary result)   Collection Time: 06/30/17 11:50 AM  Result Value Ref Range Status   Specimen Description BLOOD LEFT ANTECUBITAL  Final   Special Requests IN PEDIATRIC BOTTLE Blood Culture adequate volume  Final   Culture   Final    NO GROWTH 4 DAYS Performed at [facility], [address] [place], [place] [address]    Report Status PENDING  Incomplete      Radiology Studies: No results found.    Scheduled Meds: . enoxaparin (LOVENOX) injection  40 mg Subcutaneous QHS  . methadone  35 mg Oral Daily   Continuous Infusions: .  ceFAZolin (ANCEF) IV 2 g (07/05/17 0608)     LOS: 9 days    Time spent: 15 minutes  Greater than 50% of the time spent on counseling and coordinating the care.   Manson Passey, MD [facility] Pager [phone]  If 7PM-7AM, please contact night-coverage [email or web address] Password [facility] 07/05/2017, 8:56 AM

## 2017-07-06 MED ORDER — LORAZEPAM 1 MG PO TABS
1.0000 mg | ORAL_TABLET | Freq: Four times a day (QID) | ORAL | Status: DC | PRN
  Administered 2017-07-06 – 2017-07-11 (×12): 1 mg via ORAL
  Filled 2017-07-06 (×12): qty 1

## 2017-07-06 MED ORDER — GABAPENTIN 300 MG PO CAPS
300.0000 mg | ORAL_CAPSULE | Freq: Three times a day (TID) | ORAL | Status: DC
  Administered 2017-07-06 – 2017-07-11 (×16): 300 mg via ORAL
  Filled 2017-07-06 (×17): qty 1

## 2017-07-06 NOTE — Progress Notes (Signed)
Assumed care of patient at 1600. Agree with previous Nurse assessment. Patient with no complaints at this time.  Earnest ConroyBrooke M. Clelia CroftShaw, RN

## 2017-07-06 NOTE — Progress Notes (Signed)
Pt requesting resumption of gabapentin and reports ativan is not working for him. Dr. Elisabeth Pigeonevine made aware. See orders placed. Pt made aware. Pt expressed thanks to this Clinical research associatewriter. Derinda SisVera Dorsey Charette,rn.

## 2017-07-06 NOTE — Progress Notes (Signed)
Patient ID: Bryan Jennings, male   DOB: Feb 02, 1991, 26 y.o.   MRN: [id number]  PROGRESS NOTE    Bryan Jennings  WUJ:[id number] DOB: 04/16/1991 DOA: 06/26/2017  PCP: Patient, No Pcp Per   Brief Narrative:  26 year old male with medical history of IVDA (heroin) and polysubstance abuse who presented to ED with fevers, chills, body aches in the setting of recent IVDA. He was found to have MSSA bacteremia, ID is following. Pt is on cefazolin and per ID end date is 07/10/2017.  Assessment & Plan:   Principal Problem:   Sepsis due to MSSA bacteremia (HCC) / Tricuspid regurgitation  - Endocarditis ruled out - Cefazolin through 9/5  Active Problems:   Polysubstance dependence including opioid type drug, episodic abuse (HCC) - Pt is on methadone treatment, per ID, consider increase to 40 mg on Friday - [facility] [place] - Patient was counseled on drug abuse - UDS 8/30 positive for opiates and benzos     Hypokalemia - Due to acute infection, sepsis - Supplemented and WNL   DVT prophylaxis: Lovenox subQ Code Status: full code  Family Communication: fiance at the bedside  Disposition Plan: home 9/5   Consultants:   ID  Cardio  Procedures:   ECHO  TEE (8/24)  Antimicrobials:   Cefazolin - end date 07/10/2017  Vanco 8/22 --> 8/23  Zosyn 8/22 -->8/23   Subjective: Feels okay.  Objective: Vitals:   07/04/17 2008 07/05/17 0607 07/05/17 1354 07/05/17 2026  BP: (!) 141/87 111/64 113/67 118/76  Pulse: 83 71 96 98  Resp: 19 19 18 18   Temp: 98.1 F (36.7 C) 97.9 F (36.6 C) 98.1 F (36.7 C) 98.2 F (36.8 C)  TempSrc: Oral Oral Oral Oral  SpO2: 100% 98% 97% 98%  Weight:      Height:        Intake/Output Summary (Last 24 hours) at 07/06/17 1046 Last data filed at 07/06/17 1022  Gross per 24 hour  Intake             1220 ml  Output                0 ml  Net             1220 ml   Filed Weights   06/26/17 1814 06/27/17  0059  Weight: 108.9 kg (240 lb) 98 kg (216 lb)   Physical Exam  Constitutional: Appears well-developed and well-nourished. No distress.  CVS: RRR, S1/S2 + Pulmonary: Effort and breath sounds normal, no stridor, rhonchi, wheezes, rales.  Abdominal: Soft. BS +,  no distension, tenderness, rebound or guarding.  Musculoskeletal: Normal range of motion. No edema and no tenderness.  Lymphadenopathy: No lymphadenopathy noted, cervical, inguinal. Neuro: Alert. Normal reflexes, muscle tone coordination. No cranial nerve deficit. Skin: Skin is warm and dry.  Psychiatric: Normal mood and affect. Behavior, judgment, thought content normal.     Data Reviewed: I have personally reviewed following labs and imaging studies  CBC:  Recent Labs Lab 07/05/17 0612  WBC 7.7  HGB 13.6  HCT 40.6  MCV 85.8  PLT 387   Basic Metabolic Panel:  Recent Labs Lab 07/05/17 0612  NA 138  K 4.5  CL 102  CO2 29  GLUCOSE 96  BUN 10  CREATININE 0.77  CALCIUM 9.2   GFR: Estimated Creatinine Clearance: 167 mL/min (by C-G formula based on SCr of 0.77 mg/dL). Liver Function Tests: No results for input(s): AST, ALT,  ALKPHOS, BILITOT, PROT, ALBUMIN in the last 168 hours. No results for input(s): LIPASE, AMYLASE in the last 168 hours. No results for input(s): AMMONIA in the last 168 hours. Coagulation Profile: No results for input(s): INR, PROTIME in the last 168 hours. Cardiac Enzymes: No results for input(s): CKTOTAL, CKMB, CKMBINDEX, TROPONINI in the last 168 hours. BNP (last 3 results) No results for input(s): PROBNP in the last 8760 hours. HbA1C: No results for input(s): HGBA1C in the last 72 hours. CBG: No results for input(s): GLUCAP in the last 168 hours. Lipid Profile: No results for input(s): CHOL, HDL, LDLCALC, TRIG, CHOLHDL, LDLDIRECT in the last 72 hours. Thyroid Function Tests: No results for input(s): TSH, T4TOTAL, FREET4, T3FREE, THYROIDAB in the last 72 hours. Anemia Panel: No  results for input(s): VITAMINB12, FOLATE, FERRITIN, TIBC, IRON, RETICCTPCT in the last 72 hours. Urine analysis:    Component Value Date/Time   COLORURINE YELLOW 06/26/2017 2216   APPEARANCEUR CLEAR 06/26/2017 2216   LABSPEC 1.020 06/26/2017 2216   PHURINE 6.0 06/26/2017 2216   GLUCOSEU NEGATIVE 06/26/2017 2216   HGBUR SMALL (A) 06/26/2017 2216   BILIRUBINUR NEGATIVE 06/26/2017 2216   KETONESUR NEGATIVE 06/26/2017 2216   PROTEINUR 30 (A) 06/26/2017 2216   NITRITE NEGATIVE 06/26/2017 2216   LEUKOCYTESUR NEGATIVE 06/26/2017 2216   Sepsis Labs: @LABRCNTIP (procalcitonin:4,lacticidven:4)   ) Recent Results (from the past 240 hour(s))  Culture, blood (Routine x 2)     Status: Abnormal   Collection Time: 06/26/17  6:40 PM  Result Value Ref Range Status   Specimen Description BLOOD LEFT ANTECUBITAL  Final   Special Requests IN PEDIATRIC BOTTLE Blood Culture adequate volume  Final   Culture  Setup Time   Final    GRAM POSITIVE COCCI IN CLUSTERS IN PEDIATRIC BOTTLE CRITICAL RESULT CALLED TO, READ BACK BY AND VERIFIED WITH: E. JACKSON, RPHARMD (WL) AT 4540 ON 06/27/17 BY C. JESSUP, MLT. Performed at [facility], [address], [place], [place] [address]    Culture STAPHYLOCOCCUS AUREUS (A)  Final   Report Status 06/29/2017 FINAL  Final   Organism ID, Bacteria STAPHYLOCOCCUS AUREUS  Final      Susceptibility   Staphylococcus aureus - MIC*    CIPROFLOXACIN <=0.5 SENSITIVE Sensitive     ERYTHROMYCIN >=8 RESISTANT Resistant     GENTAMICIN <=0.5 SENSITIVE Sensitive     OXACILLIN 0.5 SENSITIVE Sensitive     TETRACYCLINE <=1 SENSITIVE Sensitive     VANCOMYCIN <=0.5 SENSITIVE Sensitive     TRIMETH/SULFA <=10 SENSITIVE Sensitive     CLINDAMYCIN <=0.25 SENSITIVE Sensitive     RIFAMPIN <=0.5 SENSITIVE Sensitive     Inducible Clindamycin NEGATIVE Sensitive     * STAPHYLOCOCCUS AUREUS  Blood Culture ID Panel (Reflexed)     Status: Abnormal   Collection Time: 06/26/17  6:40 PM    Result Value Ref Range Status   Enterococcus species NOT DETECTED NOT DETECTED Final   Listeria monocytogenes NOT DETECTED NOT DETECTED Final   Staphylococcus species DETECTED (A) NOT DETECTED Final    Comment: CRITICAL RESULT CALLED TO, READ BACK BY AND VERIFIED WITH: E. JACKSON, RPHARMD (WL) AT 1478 ON 06/27/17 BY C. JESSUP, MLT.    Staphylococcus aureus DETECTED (A) NOT DETECTED Final    Comment: Methicillin (oxacillin) susceptible Staphylococcus aureus (MSSA). Preferred therapy is anti staphylococcal beta lactam antibiotic (Cefazolin or Nafcillin), unless clinically contraindicated. CRITICAL RESULT CALLED TO, READ BACK BY AND VERIFIED WITH: E. JACKSON, RPHARMD (WL) AT 2956 ON 06/27/17 BY C. JESSUP,  MLT.    Methicillin resistance NOT DETECTED NOT DETECTED Final   Streptococcus species NOT DETECTED NOT DETECTED Final   Streptococcus agalactiae NOT DETECTED NOT DETECTED Final   Streptococcus pneumoniae NOT DETECTED NOT DETECTED Final   Streptococcus pyogenes NOT DETECTED NOT DETECTED Final   Acinetobacter baumannii NOT DETECTED NOT DETECTED Final   Enterobacteriaceae species NOT DETECTED NOT DETECTED Final   Enterobacter cloacae complex NOT DETECTED NOT DETECTED Final   Escherichia coli NOT DETECTED NOT DETECTED Final   Klebsiella oxytoca NOT DETECTED NOT DETECTED Final   Klebsiella pneumoniae NOT DETECTED NOT DETECTED Final   Proteus species NOT DETECTED NOT DETECTED Final   Serratia marcescens NOT DETECTED NOT DETECTED Final   Haemophilus influenzae NOT DETECTED NOT DETECTED Final   Neisseria meningitidis NOT DETECTED NOT DETECTED Final   Pseudomonas aeruginosa NOT DETECTED NOT DETECTED Final   Candida albicans NOT DETECTED NOT DETECTED Final   Candida glabrata NOT DETECTED NOT DETECTED Final   Candida krusei NOT DETECTED NOT DETECTED Final   Candida parapsilosis NOT DETECTED NOT DETECTED Final   Candida tropicalis NOT DETECTED NOT DETECTED Final    Comment: Performed at [facility], [address] [place], [place] [address]  Culture, blood (Routine x 2)     Status: Abnormal   Collection Time: 06/26/17  6:50 PM  Result Value Ref Range Status   Specimen Description BLOOD RIGHT ANTECUBITAL  Final   Special Requests   Final    BOTTLES DRAWN AEROBIC AND ANAEROBIC Blood Culture results may not be optimal due to an inadequate volume of blood received in culture bottles   Culture  Setup Time   Final    GRAM POSITIVE COCCI IN CLUSTERS IN BOTH AEROBIC AND ANAEROBIC BOTTLES CRITICAL VALUE NOTED.  VALUE IS CONSISTENT WITH PREVIOUSLY REPORTED AND CALLED VALUE.    Culture (A)  Final    STAPHYLOCOCCUS AUREUS SUSCEPTIBILITIES PERFORMED ON PREVIOUS CULTURE WITHIN THE LAST 5 DAYS. Performed at [facility], [address] [place], [place] [address]    Report Status 06/29/2017 FINAL  Final  Culture, blood (single)     Status: Abnormal   Collection Time: 06/26/17  7:38 PM  Result Value Ref Range Status   Specimen Description BLOOD LEFT HAND  Final   Special Requests IN PEDIATRIC BOTTLE Blood Culture adequate volume  Final   Culture  Setup Time   Final    GRAM POSITIVE COCCI IN CLUSTERS IN PEDIATRIC BOTTLE CRITICAL VALUE NOTED.  VALUE IS CONSISTENT WITH PREVIOUSLY REPORTED AND CALLED VALUE.    Culture (A)  Final    STAPHYLOCOCCUS AUREUS SUSCEPTIBILITIES PERFORMED ON PREVIOUS CULTURE WITHIN THE LAST 5 DAYS. Performed at [facility], [address] [place], [place] [address]    Report Status 06/29/2017 FINAL  Final  MRSA PCR Screening     Status: None   Collection Time: 06/27/17  1:12 AM  Result Value Ref Range Status   MRSA by PCR NEGATIVE NEGATIVE Final    Comment:        The GeneXpert MRSA Assay (FDA approved for NASAL specimens only), is one component of a comprehensive MRSA colonization surveillance program. It is not intended to diagnose MRSA infection nor to guide or monitor treatment for MRSA infections.   Culture, blood (routine x  2)     Status: None   Collection Time: 06/30/17 11:50 AM  Result Value Ref Range Status   Specimen Description BLOOD RIGHT HAND  Final   Special  Requests IN PEDIATRIC BOTTLE Blood Culture adequate volume  Final   Culture   Final    NO GROWTH 5 DAYS Performed at [facility], [address] [place], [place] [address]    Report Status 07/05/2017 FINAL  Final  Culture, blood (routine x 2)     Status: None   Collection Time: 06/30/17 11:50 AM  Result Value Ref Range Status   Specimen Description BLOOD LEFT ANTECUBITAL  Final   Special Requests IN PEDIATRIC BOTTLE Blood Culture adequate volume  Final   Culture   Final    NO GROWTH 5 DAYS Performed at [facility], [address] [place], [place] [address]    Report Status 07/05/2017 FINAL  Final      Radiology Studies: No results found.    Scheduled Meds: . enoxaparin (LOVENOX) injection  40 mg Subcutaneous QHS  . methadone  40 mg Oral Daily   Continuous Infusions: .  ceFAZolin (ANCEF) IV Stopped (07/06/17 0710)     LOS: 10 days    Time spent: 15 minutes  Greater than 50% of the time spent on counseling and coordinating the care.   Manson Passey, MD [facility] Pager [phone]  If 7PM-7AM, please contact night-coverage [email or web address] Password [email or web address] 07/06/2017, 10:46 AM

## 2017-07-07 NOTE — Progress Notes (Signed)
Patient ID: Bryan Jennings Rosemond, male   DOB: 05/18/1991, 26 y.o.   MRN: [id number]  PROGRESS NOTE    Bryan Jennings Stoffel  WUJ:[id number] DOB: 05/18/1991 DOA: 06/26/2017  PCP: Patient, No Pcp Per   Brief Narrative:  26 year old male with medical history of IVDA (heroin) and polysubstance abuse who presented to ED with fevers, chills, body aches in the setting of recent IVDA. He was found to have MSSA bacteremia, ID is following. Pt is on cefazolin and per ID end date is 07/10/2017.  Assessment & Plan:   Principal Problem:   Sepsis due to MSSA bacteremia (HCC) / Tricuspid regurgitation  - Endocarditis ruled out - Continue cefazolin through 9/5  Active Problems:   Polysubstance dependence including opioid type drug, episodic abuse (HCC) - Pt is on methadone treatment, per ID, consider increase to 40 mg on Friday - [facility] [place] - Patient was counseled on drug abuse - UDS 8/30 positive for opiates and benzos     Hypokalemia - Due to acute infection, sepsis - Supplemented    DVT prophylaxis: Lovenox subQ Code Status: full code  Family Communication: family at bedside  Disposition Plan: home once abx completed    Consultants:   ID  Cardio  Procedures:   ECHO  TEE (8/24)  Antimicrobials:   Cefazolin - end date 07/10/2017  Vanco 8/22 --> 8/23  Zosyn 8/22 -->8/23   Subjective: No overnight events.   Objective: Vitals:   07/05/17 2026 07/06/17 1341 07/06/17 2021 07/07/17 0604  BP: 118/76 119/83 (!) 133/91 123/73  Pulse: 98 89 84 (!) 107  Resp: 18 18 20 18   Temp: 98.2 F (36.8 C) 98.2 F (36.8 C) 98.3 F (36.8 C) 98.3 F (36.8 C)  TempSrc: Oral Oral Oral Oral  SpO2: 98% 97% 100% 97%  Weight:      Height:        Intake/Output Summary (Last 24 hours) at 07/07/17 0933 Last data filed at 07/07/17 [id number]  Gross per 24 hour  Intake              500 ml  Output                0 ml  Net              500 ml   Filed Weights     06/26/17 1814 06/27/17 0059  Weight: 108.9 kg (240 lb) 98 kg (216 lb)    Physical Exam  Constitutional: Appears well-developed and well-nourished. No distress.  CVS: RRR, S1/S2 + Pulmonary: Effort and breath sounds normal, no stridor Abdominal: Soft. BS +,  no distension, tenderness, rebound or guarding.  Musculoskeletal: Normal range of motion. No edema and no tenderness.  Lymphadenopathy: No lymphadenopathy noted, cervical, inguinal. Neuro: Alert. No cranial nerve deficit. Skin: Skin is warm and dry.  Psychiatric: Normal mood and affect. Behavior, judgment, thought content normal.       Data Reviewed: I have personally reviewed following labs and imaging studies  CBC:  Recent Labs Lab 07/05/17 0612  WBC 7.7  HGB 13.6  HCT 40.6  MCV 85.8  PLT 387   Basic Metabolic Panel:  Recent Labs Lab 07/05/17 0612  NA 138  K 4.5  CL 102  CO2 29  GLUCOSE 96  BUN 10  CREATININE 0.77  CALCIUM 9.2   GFR: Estimated Creatinine Clearance: 167 mL/min (by C-G formula based on SCr of 0.77 mg/dL). Liver Function Tests: No results for input(s):  AST, ALT, ALKPHOS, BILITOT, PROT, ALBUMIN in the last 168 hours. No results for input(s): LIPASE, AMYLASE in the last 168 hours. No results for input(s): AMMONIA in the last 168 hours. Coagulation Profile: No results for input(s): INR, PROTIME in the last 168 hours. Cardiac Enzymes: No results for input(s): CKTOTAL, CKMB, CKMBINDEX, TROPONINI in the last 168 hours. BNP (last 3 results) No results for input(s): PROBNP in the last 8760 hours. HbA1C: No results for input(s): HGBA1C in the last 72 hours. CBG: No results for input(s): GLUCAP in the last 168 hours. Lipid Profile: No results for input(s): CHOL, HDL, LDLCALC, TRIG, CHOLHDL, LDLDIRECT in the last 72 hours. Thyroid Function Tests: No results for input(s): TSH, T4TOTAL, FREET4, T3FREE, THYROIDAB in the last 72 hours. Anemia Panel: No results for input(s): VITAMINB12, FOLATE,  FERRITIN, TIBC, IRON, RETICCTPCT in the last 72 hours. Urine analysis:    Component Value Date/Time   COLORURINE YELLOW 06/26/2017 2216   APPEARANCEUR CLEAR 06/26/2017 2216   LABSPEC 1.020 06/26/2017 2216   PHURINE 6.0 06/26/2017 2216   GLUCOSEU NEGATIVE 06/26/2017 2216   HGBUR SMALL (A) 06/26/2017 2216   BILIRUBINUR NEGATIVE 06/26/2017 2216   KETONESUR NEGATIVE 06/26/2017 2216   PROTEINUR 30 (A) 06/26/2017 2216   NITRITE NEGATIVE 06/26/2017 2216   LEUKOCYTESUR NEGATIVE 06/26/2017 2216   Sepsis Labs: @LABRCNTIP (procalcitonin:4,lacticidven:4)   ) Recent Results (from the past 240 hour(s))  Culture, blood (routine x 2)     Status: None   Collection Time: 06/30/17 11:50 AM  Result Value Ref Range Status   Specimen Description BLOOD RIGHT HAND  Final   Special Requests IN PEDIATRIC BOTTLE Blood Culture adequate volume  Final   Culture   Final    NO GROWTH 5 DAYS Performed at [facility], [address], [place], [place] [address]    Report Status 07/05/2017 FINAL  Final  Culture, blood (routine x 2)     Status: None   Collection Time: 06/30/17 11:50 AM  Result Value Ref Range Status   Specimen Description BLOOD LEFT ANTECUBITAL  Final   Special Requests IN PEDIATRIC BOTTLE Blood Culture adequate volume  Final   Culture   Final    NO GROWTH 5 DAYS Performed at [facility], [address], [place], [place] [address]    Report Status 07/05/2017 FINAL  Final      Radiology Studies: No results found.    Scheduled Meds: . enoxaparin (LOVENOX) injection  40 mg Subcutaneous QHS  . gabapentin  300 mg Oral TID  . methadone  40 mg Oral Daily   Continuous Infusions: .  ceFAZolin (ANCEF) IV Stopped (07/07/17 0634)     LOS: 11 days    Time spent: 15 minutes  Greater than 50% of the time spent on counseling and coordinating the care.   Manson Passey, MD [facility] Pager [phone]  If 7PM-7AM, please contact  night-coverage [email or web address] Password [email or web address] 07/07/2017, 9:33 AM

## 2017-07-08 ENCOUNTER — Inpatient Hospital Stay (HOSPITAL_COMMUNITY): Payer: Self-pay

## 2017-07-08 ENCOUNTER — Encounter (HOSPITAL_COMMUNITY): Payer: Self-pay | Admitting: Radiology

## 2017-07-08 LAB — TROPONIN I: Troponin I: <0.03 ng/mL (ref <0.03)

## 2017-07-08 MED ORDER — IOPAMIDOL (ISOVUE-370) INJECTION 76%
INTRAVENOUS | Status: AC
  Administered 2017-07-08: 100 mL via INTRAVENOUS
  Filled 2017-07-08: qty 100

## 2017-07-08 MED ORDER — IOPAMIDOL (ISOVUE-370) INJECTION 76%
100.0000 mL | Freq: Once | INTRAVENOUS | Status: AC | PRN
  Administered 2017-07-08: 100 mL via INTRAVENOUS

## 2017-07-08 NOTE — Progress Notes (Signed)
Patient ID: Bryan Jennings, male   DOB: [birth year], 26 y.o.   MRN: [id number]  PROGRESS NOTE    Caitlin Hillmer  WUJ:[id number] DOB: [birth year] DOA: 06/26/2017  PCP: Patient, No Pcp Per   Brief Narrative:  26 year old male with medical history of IVDA (heroin) and polysubstance abuse who presented to ED with fevers, chills, body aches in the setting of recent IVDA. He was found to have MSSA bacteremia, ID is following. Pt is on cefazolin and per ID end date is 07/10/2017.  Assessment & Plan:   Principal Problem: Sepsis due to MSSA bacteremia (HCC) / Tricuspid regurgitation  - Endocarditis ruled out - No vegetation on ECHO or TEE - Cefazolin through 07/10/2017   Active Problems:   Pleuritic chest pain - Considering history of drug abuse and bacteremia concern is for PE - Will obtain CT angio chest  - Obtain trop level  Polysubstance dependence including opioid type drug, episodic abuse (HCC) - Pt is on methadone treatment, per ID, consider increase to 40 mg on Friday - [facility] [place] - Patient was counseled on drug abuse - UDS 8/30 positive for opiates and benzos   Hypokalemia - Due to acute infection - Supplemented   DVT prophylaxis: Lovenox suBQ Code Status: full code  Family Communication: fiance at the bedisde Disposition Plan: CT chest today to rule out PE   Consultants:   ID  Cardio  Procedures:   ECHO 8/23 - EF 50%, no definitive vegetation   TEE (8/24) - EF 55-60%, no has positive bubble study for right to left shunt, patent foramen ovale, also has 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   Objective: Vitals:   07/07/17 0604 07/07/17 1523 07/07/17 2033 07/08/17 0635  BP: 123/73 128/74 (!) 128/92 122/84  Pulse: (!) 107  (!) 127 (!) 113    Resp: 18 20 20 19   Temp: 98.3 F (36.8 C) 98.3 F (36.8 C) 98.2 F (36.8 C) 98.9 F (37.2 C)  TempSrc: Oral Oral Oral Oral  SpO2: 97% 98% 98% 97%  Weight:      Height:        Intake/Output Summary (Last 24 hours) at 07/08/17 0803 Last data filed at 07/07/17 1524  Gross per 24 hour  Intake              360 ml  Output                0 ml  Net              360 ml   Filed Weights   06/26/17 1814 06/27/17 0059  Weight: 108.9 kg (240 lb) 98 kg (216 lb)    Examination:  General exam: Appears calm and comfortable  Respiratory system: Clear to auscultation. Respiratory effort normal. Cardiovascular system: S1 & S2 heard, RRR. Gastrointestinal system: Abdomen is nondistended, soft and nontender. No organomegaly or masses felt. Normal bowel sounds heard. Central nervous system: Alert and oriented. No focal neurological deficits. Extremities: Symmetric 5 x 5 power. Skin: No rashes, lesions or ulcers Psychiatry: Judgement and insight appear normal. Mood & affect appropriate.   Data Reviewed: I have personally reviewed following labs and imaging studies  CBC:  Recent Labs Lab 07/05/17 0612  WBC 7.7  HGB 13.6  HCT 40.6  MCV  85.8  PLT 387   Basic Metabolic Panel:  Recent Labs Lab 07/05/17 0612  NA 138  K 4.5  CL 102  CO2 29  GLUCOSE 96  BUN 10  CREATININE 0.77  CALCIUM 9.2   GFR: Estimated Creatinine Clearance: 167 mL/min (by C-G formula based on SCr of 0.77 mg/dL). Liver Function Tests: No results for input(s): AST, ALT, ALKPHOS, BILITOT, PROT, ALBUMIN in the last 168 hours. No results for input(s): LIPASE, AMYLASE in the last 168 hours. No results for input(s): AMMONIA in the last 168 hours. Coagulation Profile: No results for input(s): INR, PROTIME in the last 168 hours. Cardiac Enzymes: No results for input(s): CKTOTAL, CKMB, CKMBINDEX, TROPONINI in the last 168 hours. BNP (last 3 results) No results for input(s): PROBNP in the last 8760  hours. HbA1C: No results for input(s): HGBA1C in the last 72 hours. CBG: No results for input(s): GLUCAP in the last 168 hours. Lipid Profile: No results for input(s): CHOL, HDL, LDLCALC, TRIG, CHOLHDL, LDLDIRECT in the last 72 hours. Thyroid Function Tests: No results for input(s): TSH, T4TOTAL, FREET4, T3FREE, THYROIDAB in the last 72 hours. Anemia Panel: No results for input(s): VITAMINB12, FOLATE, FERRITIN, TIBC, IRON, RETICCTPCT in the last 72 hours. Urine analysis:    Component Value Date/Time   COLORURINE YELLOW 06/26/2017 2216   APPEARANCEUR CLEAR 06/26/2017 2216   LABSPEC 1.020 06/26/2017 2216   PHURINE 6.0 06/26/2017 2216   GLUCOSEU NEGATIVE 06/26/2017 2216   HGBUR SMALL (A) 06/26/2017 2216   BILIRUBINUR NEGATIVE 06/26/2017 2216   KETONESUR NEGATIVE 06/26/2017 2216   PROTEINUR 30 (A) 06/26/2017 2216   NITRITE NEGATIVE 06/26/2017 2216   LEUKOCYTESUR NEGATIVE 06/26/2017 2216   Sepsis Labs: @LABRCNTIP (procalcitonin:4,lacticidven:4)   ) Recent Results (from the past 240 hour(s))  Culture, blood (routine x 2)     Status: None   Collection Time: 06/30/17 11:50 AM  Result Value Ref Range Status   Specimen Description BLOOD RIGHT HAND  Final   Special Requests IN PEDIATRIC BOTTLE Blood Culture adequate volume  Final   Culture   Final    NO GROWTH 5 DAYS Performed at [facility], [address] [place], [place] [address]    Report Status 07/05/2017 FINAL  Final  Culture, blood (routine x 2)     Status: None   Collection Time: 06/30/17 11:50 AM  Result Value Ref Range Status   Specimen Description BLOOD LEFT ANTECUBITAL  Final   Special Requests IN PEDIATRIC BOTTLE Blood Culture adequate volume  Final   Culture   Final    NO GROWTH 5 DAYS Performed at [facility], [address] [place], [place] [address]    Report Status 07/05/2017 FINAL  Final      Radiology Studies: No results found.      Scheduled Meds: . enoxaparin (LOVENOX)  injection  40 mg Subcutaneous QHS  . gabapentin  300 mg Oral TID  . methadone  40 mg Oral Daily   Continuous Infusions: .  ceFAZolin (ANCEF) IV Stopped (07/08/17 0708)     LOS: 12 days    Time spent: 25 minutes  Greater than 50% of the time spent on counseling and coordinating the care.   Manson PasseyAlma Joliene Salvador, MD [facility] Pager [phone]  If 7PM-7AM, please contact night-coverage [email or web address] Password [email or web address] 07/08/2017, 8:03 AM

## 2017-07-08 NOTE — Clinical Social Work Note (Signed)
Clinical Social Work Assessment  Patient Details  Name: Bryan Jennings MRN: [id number] Date of Birth: [birth year]  Date of referral:  07/08/17               Reason for consult:  Substance Use/ETOH Abuse                Permission sought to share information with:  Facility Medical sales representative, Case Estate manager/land agent granted to share information::  Yes, Verbal Permission Granted  Name::        Agency::  [facility]  Relationship::     Contact Information:     Housing/Transportation Living arrangements for the past 2 months:  Homeless Source of Information:  Patient, Spouse Patient Interpreter Needed:  None Criminal Activity/Legal Involvement Pertinent to Current Situation/Hospitalization:  No - Comment as needed Significant Relationships:  Significant Other Lives with:  Significant Other Do you feel safe going back to the place where you live?   (Patient currently homeless) Need for family participation in patient care:  No (Coment)  Care giving concerns:  Patient is currently receiving IV antibiotics for MSSA bacteremia. Patient reported that he is currently homeless and is concerned about being able to get medications at discharge. CSW agreed to inform RNCM about patient's need for assistance with obtaining medications.    Social Worker assessment / plan:  CSW spoke with patient and patient's fiance at bedside regarding consult for substance abuse. Patient granted verbal permission for his fiance to stay and participate in assessment. CSW and patient discussed patient's substance use and treatment. Patient reported that he hasn't used drugs in 15 days and that he is currently enrolled in a methadone clinic. Patient reported that he has been in recovery in the past and that it didn't last, patient reported that the methadone clinic maintenence program has been working. Patient reported that he quit his job and does not have any source of income and is currently homeless.  CSW provided patient with information about local shelters and [place] housing coalition. CSW also provided patient with information about [place] crisis assistance programs. Patient reported that he is currently enrolled at the [facility] and that he cant afford the 14 dollar/day fee, patient requested information about the [facility] meth[facility]one clinic. CSW called [facility] and obtained information about the intake process for their methadone clinic and provided that information to patient. CSW also provided patient with information about additional outpatient substance abuse treatment options.  CSW provided patient with all requested resources and will inform RNCM about patient's need for assistance with obtaining medications.   Employment status:  Unemployed Health and safety inspector:  Self Pay (Medicaid Pending) PT Recommendations:  Not assessed at this time Information / Referral to community resources:  Outpatient Substance Abuse Treatment Options, Shelter  Patient/Family's Response to care:  Patient and patient's fiance appreciative of CSW intervention and reosurces provided. Patient reported that he has received great care at the hospital and that he is feeling better.  Patient/Family's Understanding of and Emotional Response to Diagnosis, Current Treatment, and Prognosis:  Patient presented calm throughout assessment and speech was goal oriented. Patient reported that he plans to continue methadone and that he wants to obtain housing for himself and his fiance. Patient expressed concern about his discharge related to maintaining his methadone use, CSW acknowledged patient's concerns and encouraged patient to contact [facility] prior to discharge to inquire about intake process and starting at discharge.   Emotional Assessment Appearance:  Appears stated age Attitude/Demeanor/Rapport:  Other (  Open) Affect (typically observed):  Calm Orientation:  Oriented to Self, Oriented to Place,  Oriented to  Time, Oriented to Situation Alcohol / Substance use:  Illicit Drugs Psych involvement (Current and /or in the community):  No (Comment)  Discharge Needs  Concerns to be addressed:  No discharge needs identified Readmission within the last 30 days:  No Current discharge risk:  None Barriers to Discharge:  Continued Medical Work up   [employer] CorporationKimberly L Kachina Niederer, LCSW 07/08/2017, 9:34 AM

## 2017-07-09 LAB — CBC
HCT: 43.1 % (ref 39.0–52.0)
Hemoglobin: 14.7 g/dL (ref 13.0–17.0)
MCH: 29.2 pg (ref 26.0–34.0)
MCHC: 34.1 g/dL (ref 30.0–36.0)
MCV: 85.5 fL (ref 78.0–100.0)
PLATELETS: 339 10*3/uL (ref 150–400)
RBC: 5.04 MIL/uL (ref 4.22–5.81)
RDW: 13.3 % (ref 11.5–15.5)
WBC: 7.5 10*3/uL (ref 4.0–10.5)

## 2017-07-09 LAB — BASIC METABOLIC PANEL
Anion gap: 8 (ref 5–15)
BUN: 14 mg/dL (ref 6–20)
CALCIUM: 9.1 mg/dL (ref 8.9–10.3)
CO2: 27 mmol/L (ref 22–32)
CREATININE: 0.71 mg/dL (ref 0.61–1.24)
Chloride: 98 mmol/L — ABNORMAL LOW (ref 101–111)
GFR calc non Af Amer: >60 mL/min (ref >60)
Glucose, Bld: 99 mg/dL (ref 65–99)
Potassium: 4.5 mmol/L (ref 3.5–5.1)
Sodium: 133 mmol/L — ABNORMAL LOW (ref 135–145)

## 2017-07-09 LAB — TSH: TSH: 3.256 u[IU]/mL (ref 0.350–4.500)

## 2017-07-09 LAB — LACTIC ACID, PLASMA: Lactic Acid, Venous: 1.4 mmol/L (ref 0.5–1.9)

## 2017-07-09 LAB — TROPONIN I: Troponin I: <0.03 ng/mL (ref <0.03)

## 2017-07-09 MED ORDER — SODIUM CHLORIDE 0.9 % IV BOLUS (SEPSIS)
500.0000 mL | Freq: Once | INTRAVENOUS | Status: AC
Start: 2017-07-09 — End: 2017-07-09
  Administered 2017-07-09: 500 mL via INTRAVENOUS

## 2017-07-09 MED ORDER — METOPROLOL TARTRATE 25 MG PO TABS
25.0000 mg | ORAL_TABLET | Freq: Two times a day (BID) | ORAL | Status: DC
  Administered 2017-07-09 – 2017-07-11 (×5): 25 mg via ORAL
  Filled 2017-07-09 (×5): qty 1

## 2017-07-09 NOTE — Progress Notes (Signed)
Upon morning rounds, pt's HR sustaining between 140s-150s. BP 118/77. Pt has no complaints of chest pain. MD Opyd made aware. New orders placed for EKG, bolus, and lab work. During the night pt's fiance stayed the night and had another visitor who was in and out of the room. Will continue to monitor closely.

## 2017-07-09 NOTE — Progress Notes (Addendum)
Patient ID: Bryan Jennings, male   DOB: 08/18/1991, 26 y.o.   MRN: [id number]  PROGRESS NOTE    Bryan Jennings  UVO:[id number] DOB: 1991-07-02 DOA: 06/26/2017  PCP: Patient, No Pcp Per   Brief Narrative:  26 year old male with medical history of IVDA (heroin) and polysubstance abuse who presented to ED with fevers, chills, body aches in the setting of recent IVDA. He was found to have MSSA bacteremia, ID is following. Pt is on cefazolin and per ID end date is 07/10/2017.  Assessment & Plan:   Principal Problem: Sepsis due to MSSA bacteremia (HCC) / Tricuspid regurgitation  - Endocarditis ruled out - No vegetation on ECHO or TEE - Patient is due to finish cefazolin 07/10/2017, last dose will be around 10 PM so he may end up going home 07/11/2017  Active Problems:   Pleuritic chest pain due to septic emboli  - Considering history of drug use and bacteremia and concern was initially for PE. CT angiogram of the chest done and showed no PE but he does have septic emboli  - I spoke with critical care medicine, they recommended outpatient follow-up continuing antibiotics as he is already doing and repeating CT scan in few weeks to make sure they don't rupture or cause pneumothorax   Polysubstance dependence including opioid type drug, episodic abuse (HCC) - Patient is on methadone treatment, pharmacy to help with methadone dose increased. This was something that was supposed to be done in methadone clinic but patient ended up in hospital so this increase was not done in the clinic yet rather it was started in the hospital - [facility] [place] - Patient was counseled on drug abuse - UDS 8/30 positive for opiates and benzos   Hypokalemia - Due to acute infection - Supplemented  DVT prophylaxis: Lovenox subcutaneous Code Status: full code  Family Communication: Family not at the bedside Disposition Plan: Home once he completes  antibiotics   Consultants:   ID  Cardio  Critical care medicine - phone call only this morning 07/09/2017  Procedures:   ECHO 8/23 - EF 50%, no definitive vegetation   TEE (8/24) - EF 55-60%, no has positive bubble study for right to left shunt, patent foramen ovale, also has old/chronic perforation with severe TR but no acute vegetation seen  Antimicrobials:   Cefazolin - end date 07/10/2017  Vanco 8/22 --> 8/23  Zosyn 8/22 -->8/23   Subjective: No overnight events.  Objective: Vitals:   07/08/17 1355 07/08/17 2050 07/09/17 0629 07/09/17 0802  BP: 133/79 (!) 149/81 118/77   Pulse: (!) 106 (!) 119 (!) 145 (!) 145  Resp: 18 18 20    Temp: 98.3 F (36.8 C) 98.2 F (36.8 C) 99.8 F (37.7 C)   TempSrc: Oral Oral Oral   SpO2: 95% 98% 98%   Weight:      Height:        Intake/Output Summary (Last 24 hours) at 07/09/17 0839 Last data filed at 07/09/17 0600  Gross per 24 hour  Intake              740 ml  Output                0 ml  Net              740 ml   Filed Weights   06/26/17 1814 06/27/17 0059  Weight: 108.9 kg (240 lb) 98 kg (216 lb)    Physical Exam  Constitutional: Appears well-developed and well-nourished. No distress.  CVS: RRR, S1/S2 +, no murmurs, no gallops, no carotid bruit.  Pulmonary: Effort and breath sounds normal, no stridor, rhonchi, wheezes, rales.  Abdominal: Soft. BS +,  no distension, tenderness, rebound or guarding.  Musculoskeletal: Normal range of motion. No edema and no tenderness.  Lymphadenopathy: No lymphadenopathy noted, cervical, inguinal. Neuro: Alert. Normal reflexes, muscle tone coordination. No cranial nerve deficit. Skin: Skin is warm and dry. No rash noted. Not diaphoretic. No erythema. No pallor.  Psychiatric: Normal mood and affect. Behavior, judgment, thought content normal.     Data Reviewed: I have personally reviewed following labs and imaging studies  CBC:  Recent Labs Lab 07/05/17 0612  WBC 7.7  HGB  13.6  HCT 40.6  MCV 85.8  PLT 387   Basic Metabolic Panel:  Recent Labs Lab 07/05/17 0612  NA 138  K 4.5  CL 102  CO2 29  GLUCOSE 96  BUN 10  CREATININE 0.77  CALCIUM 9.2   GFR: Estimated Creatinine Clearance: 167 mL/min (by C-G formula based on SCr of 0.77 mg/dL). Liver Function Tests: No results for input(s): AST, ALT, ALKPHOS, BILITOT, PROT, ALBUMIN in the last 168 hours. No results for input(s): LIPASE, AMYLASE in the last 168 hours. No results for input(s): AMMONIA in the last 168 hours. Coagulation Profile: No results for input(s): INR, PROTIME in the last 168 hours. Cardiac Enzymes:  Recent Labs Lab 07/08/17 0924  TROPONINI <0.03   BNP (last 3 results) No results for input(s): PROBNP in the last 8760 hours. HbA1C: No results for input(s): HGBA1C in the last 72 hours. CBG: No results for input(s): GLUCAP in the last 168 hours. Lipid Profile: No results for input(s): CHOL, HDL, LDLCALC, TRIG, CHOLHDL, LDLDIRECT in the last 72 hours. Thyroid Function Tests: No results for input(s): TSH, T4TOTAL, FREET4, T3FREE, THYROIDAB in the last 72 hours. Anemia Panel: No results for input(s): VITAMINB12, FOLATE, FERRITIN, TIBC, IRON, RETICCTPCT in the last 72 hours. Urine analysis:    Component Value Date/Time   COLORURINE YELLOW 06/26/2017 2216   APPEARANCEUR CLEAR 06/26/2017 2216   LABSPEC 1.020 06/26/2017 2216   PHURINE 6.0 06/26/2017 2216   GLUCOSEU NEGATIVE 06/26/2017 2216   HGBUR SMALL (A) 06/26/2017 2216   BILIRUBINUR NEGATIVE 06/26/2017 2216   KETONESUR NEGATIVE 06/26/2017 2216   PROTEINUR 30 (A) 06/26/2017 2216   NITRITE NEGATIVE 06/26/2017 2216   LEUKOCYTESUR NEGATIVE 06/26/2017 2216   Sepsis Labs: @LABRCNTIP (procalcitonin:4,lacticidven:4)    Recent Results (from the past 240 hour(s))  Culture, blood (routine x 2)     Status: None   Collection Time: 06/30/17 11:50 AM  Result Value Ref Range Status   Specimen Description BLOOD RIGHT HAND  Final    Special Requests IN PEDIATRIC BOTTLE Blood Culture adequate volume  Final   Culture   Final    NO GROWTH 5 DAYS Performed at [facility], [address] [place], [place] [address]    Report Status 07/05/2017 FINAL  Final  Culture, blood (routine x 2)     Status: None   Collection Time: 06/30/17 11:50 AM  Result Value Ref Range Status   Specimen Description BLOOD LEFT ANTECUBITAL  Final   Special Requests IN PEDIATRIC BOTTLE Blood Culture adequate volume  Final   Culture   Final    NO GROWTH 5 DAYS Performed at [facility], [address] [place], [place] [address]    Report Status 07/05/2017 FINAL  Final  Radiology Studies: Ct Angio Chest Pe W Or Wo Contrast  Result Date: 07/08/2017 CLINICAL DATA:  Acute left chest pain, positive D-dimer, previous right thoracotomy for empyema October 2017 EXAM: CT ANGIOGRAPHY CHEST WITH CONTRAST TECHNIQUE: Multidetector CT imaging of the chest was performed using the standard protocol during bolus administration of intravenous contrast. Multiplanar CT image reconstructions and MIPs were obtained to evaluate the vascular anatomy. CONTRAST:  77 cc Isovue 370 COMPARISON:  06/26/2017 chest x-ray FINDINGS: Cardiovascular: No significant acute pulmonary embolus or filling defect within the central and proximal pulmonary arterial vasculature. Intact thoracic aorta. No aneurysm or dissection. No mediastinal hemorrhage or hematoma. Normal heart size. No pericardial effusion. Mediastinum/Nodes: Numerous small nonenlarged mediastinal and hilar lymph nodes. These all have short axis measurements of 7 mm or less. No significant adenopathy. Thyroid, trachea and esophagus unremarkable. Lungs/Pleura: Numerous scattered peripheral/subpleural nodular opacities throughout both lungs. Several of the nodules in the right upper lobe, right middle lobe, and left lower lobe have central cavitation. Findings most compatible with septic emboli. Small dependent  pleural effusions, slightly larger on the left. No other pleural abnormality or pneumothorax. Trachea and central airways are patent. No mucous plugging or bronchiectasis. Upper Abdomen: Ill-defined wedge-shaped hypoattenuation extending to the spleen upper pole, images 68-76. Appearance is nonspecific but suspect a small splenic infarct. No other acute upper abdominal finding. Musculoskeletal: No acute or abnormal osseous finding. Sternum intact. Review of the MIP images confirms the above findings. IMPRESSION: Numerous bilateral scattered nodular opacities several with central cavitation compatible with bilateral septic emboli. Associated small pleural effusions Negative for significant acute pulmonary embolus Indeterminate splenic hypodensity superiorly, suspect splenic infarct. Electronically Signed   By: Judie Petit.  Shick M.D.   On: 07/08/2017 09:00        Scheduled Meds: . enoxaparin (LOVENOX) injection  40 mg Subcutaneous QHS  . gabapentin  300 mg Oral TID  . methadone  40 mg Oral Daily   Continuous Infusions: .  ceFAZolin (ANCEF) IV Stopped (07/09/17 0653)     LOS: 13 days    Time spent: 25 minutes  Greater than 50% of the time spent on counseling and coordinating the care.   Manson Passey, MD [facility] Pager [phone]  If 7PM-7AM, please contact night-coverage [email or web address] Password [facility] 07/09/2017, 8:39 AM

## 2017-07-09 NOTE — Progress Notes (Signed)
HR 140 this am, obtain 12 lead EKG, trop yesterday WNL Start low dose metoprolol 25 mg BID  Manson Passeylma Kody Vigil  [facility] [phone]

## 2017-07-09 NOTE — Progress Notes (Signed)
Nutrition Brief Note  Patient identified as extended length of stay. Pt reports no loss in appetite or unintended wt loss.   Body mass index is 30.13 kg/m. Patient meets criteria for obese based on current BMI.   Current diet order is regular, patient is consuming approximately 100% of meals at this time. Labs and medications reviewed.   No nutrition interventions warranted at this time. If nutrition issues arise, please consult RD.   Vanessa Kickarly Antoinne Spadaccini RD, LDN Clinical Nutrition Pager # [phone]

## 2017-07-10 MED ORDER — CEPHALEXIN 500 MG PO CAPS
500.0000 mg | ORAL_CAPSULE | Freq: Four times a day (QID) | ORAL | Status: DC
  Administered 2017-07-10 – 2017-07-11 (×5): 500 mg via ORAL
  Filled 2017-07-10 (×5): qty 1

## 2017-07-10 NOTE — Care Management Note (Signed)
Case Management Note  Patient Details  Name: Bryan Jennings MRN: [id number] Date of Birth: 01/06/1991  Subjective/Objective: Noted ID-transition to po abx. No CM needs.                   Action/Plan:d/c home.   Expected Discharge Date:  06/29/17               Expected Discharge Plan:  Home/Self Care  In-House Referral:  Clinical Social Work  Discharge planning Services  CM Consult  Post Acute Care Choice:    Choice offered to:     DME Arranged:    DME Agency:     HH Arranged:    HH Agency:     Status of Service:  In process, will continue to follow  If discussed at Long Length of Stay Meetings, dates discussed:    Additional Comments:  Lanier ClamMahabir, Bryan Remmert, RN 07/10/2017, 12:56 PM

## 2017-07-10 NOTE — Progress Notes (Signed)
Patient ID: Bryan Jennings, male   DOB: Dec 06, 1990, 26 y.o.   MRN: [id number]  PROGRESS NOTE    Bryan Jennings  WUJ:[id number] DOB: 07/14/1991 DOA: 06/26/2017  PCP: Patient, No Pcp Per   Brief Narrative:  26 year old male with medical history of IVDA (heroin) and polysubstance abuse who presented to ED with fevers, chills, body aches in the setting of recent IVDA. He was found to have MSSA bacteremia, ID is following. Pt is on cefazolin and per ID end date is 07/10/2017.  Assessment & Plan:   Principal Problem: Sepsis [facility] / Tricuspid regurgitation and septic embolization noted on CT chest 9/4 - Endocarditis ruled out - No vegetation on 2D ECHO or TEE - Patient is due to finish cefazolin 07/10/2017, last dose will be around 10 PM  - dicussed CT chest findings concerning for septic embolization in the lungs which is new compared to CT scan from 12/2016, reviewed these with radiologist on-call and discussed with infectious disease, plan to treat for 2 more weeks using oral cephalexin  Septic pulmonary emboli -Possibly due to seeding from bacteremia versus recent endocarditis -Longer course of antibiotics as discussed above -No evidence of endocarditis noted on transthoracic and TEE  Polysubstance dependence including opioid type drug, episodic abuse (HCC) - Patient is on methadone treatment, [facility] [place] - Patient was counseled on drug abuse - UDS 8/30 positive for opiates and benzos  - Continue methadone now at 35 mg daily, [facility]  Hypokalemia - Supplemented  DVT prophylaxis: Lovenox subcutaneous Code Status: full code  Family Communication: Family not at the bedside Disposition Plan: Discharge tomorrow once IV antibiotic course completed Consultants:   ID  Cardio  Critical care medicine - phone call only this morning 07/09/2017  Procedures:   ECHO 8/23 - EF  50%, no definitive vegetation   TEE (8/24) - EF 55-60%, no has positive bubble study for right to left shunt, patent foramen ovale, also has old/chronic perforation with severe TR but no acute vegetation seen  Antimicrobials:   Cefazolin - end date 07/10/2017  Vanco 8/22 --> 8/23  Zosyn 8/22 -->8/23   Subjective: Mild intermittent pleuritic chest pain  Objective: Vitals:   07/09/17 1308 07/09/17 2030 07/10/17 0627 07/10/17 0943  BP: 120/70 119/67 109/72 101/65  Pulse: 100 (!) 102 78 75  Resp: 18 18 20    Temp:  98.9 F (37.2 C) 98 F (36.7 C)   TempSrc:  Oral Oral   SpO2: 98% 98% 100%   Weight:      Height:        Intake/Output Summary (Last 24 hours) at 07/10/17 1504 Last data filed at 07/09/17 2200  Gross per 24 hour  Intake              660 ml  Output                0 ml  Net              660 ml   Filed Weights   06/26/17 1814 06/27/17 0059  Weight: 108.9 kg (240 lb) 98 kg (216 lb)    Physical Exam  Gen: Awake, Alert, Oriented X 3,  HEENT: PERRLA, Neck supple, no JVD Lungs: Good air movement bilaterally, CTAB CVS: RRR,No Gallops,Rubs, no murmur noted Abd: soft, Non tender, non distended, BS present Extremities: No Cyanosis, Clubbing or edema Skin: no new rashes  Data Reviewed: I  have personally reviewed following labs and imaging studies  CBC:  Recent Labs Lab 07/05/17 0612 07/09/17 0934  WBC 7.7 7.5  HGB 13.6 14.7  HCT 40.6 43.1  MCV 85.8 85.5  PLT 387 339   Basic Metabolic Panel:  Recent Labs Lab 07/05/17 0612 07/09/17 0934  NA 138 133*  K 4.5 4.5  CL 102 98*  CO2 29 27  GLUCOSE 96 99  BUN 10 14  CREATININE 0.77 0.71  CALCIUM 9.2 9.1   GFR: Estimated Creatinine Clearance: 167 mL/min (by C-G formula based on SCr of 0.71 mg/dL). Liver Function Tests: No results for input(s): AST, ALT, ALKPHOS, BILITOT, PROT, ALBUMIN in the last 168 hours. No results for input(s): LIPASE, AMYLASE in the last 168 hours. No results for input(s):  AMMONIA in the last 168 hours. Coagulation Profile: No results for input(s): INR, PROTIME in the last 168 hours. Cardiac Enzymes:  Recent Labs Lab 07/08/17 0924 07/09/17 0934  TROPONINI <0.03 <0.03   BNP (last 3 results) No results for input(s): PROBNP in the last 8760 hours. HbA1C: No results for input(s): HGBA1C in the last 72 hours. CBG: No results for input(s): GLUCAP in the last 168 hours. Lipid Profile: No results for input(s): CHOL, HDL, LDLCALC, TRIG, CHOLHDL, LDLDIRECT in the last 72 hours. Thyroid Function Tests:  Recent Labs  07/09/17 0934  TSH 3.256   Anemia Panel: No results for input(s): VITAMINB12, FOLATE, FERRITIN, TIBC, IRON, RETICCTPCT in the last 72 hours. Urine analysis:    Component Value Date/Time   COLORURINE YELLOW 06/26/2017 2216   APPEARANCEUR CLEAR 06/26/2017 2216   LABSPEC 1.020 06/26/2017 2216   PHURINE 6.0 06/26/2017 2216   GLUCOSEU NEGATIVE 06/26/2017 2216   HGBUR SMALL (A) 06/26/2017 2216   BILIRUBINUR NEGATIVE 06/26/2017 2216   KETONESUR NEGATIVE 06/26/2017 2216   PROTEINUR 30 (A) 06/26/2017 2216   NITRITE NEGATIVE 06/26/2017 2216   LEUKOCYTESUR NEGATIVE 06/26/2017 2216   Sepsis Labs: @LABRCNTIP (procalcitonin:4,lacticidven:4)    No results found for this or any previous visit (from the past 240 hour(s)).    Radiology Studies: Ct Angio Chest Pe W Or Wo Contrast  Result Date: 07/08/2017 CLINICAL DATA:  Acute left chest pain, positive D-dimer, previous right thoracotomy for empyema October 2017 EXAM: CT ANGIOGRAPHY CHEST WITH CONTRAST TECHNIQUE: Multidetector CT imaging of the chest was performed using the standard protocol during bolus administration of intravenous contrast. Multiplanar CT image reconstructions and MIPs were obtained to evaluate the vascular anatomy. CONTRAST:  77 cc Isovue 370 COMPARISON:  06/26/2017 chest x-ray FINDINGS: Cardiovascular: No significant acute pulmonary embolus or filling defect within the central and  proximal pulmonary arterial vasculature. Intact thoracic aorta. No aneurysm or dissection. No mediastinal hemorrhage or hematoma. Normal heart size. No pericardial effusion. Mediastinum/Nodes: Numerous small nonenlarged mediastinal and hilar lymph nodes. These all have short axis measurements of 7 mm or less. No significant adenopathy. Thyroid, trachea and esophagus unremarkable. Lungs/Pleura: Numerous scattered peripheral/subpleural nodular opacities throughout both lungs. Several of the nodules in the right upper lobe, right middle lobe, and left lower lobe have central cavitation. Findings most compatible with septic emboli. Small dependent pleural effusions, slightly larger on the left. No other pleural abnormality or pneumothorax. Trachea and central airways are patent. No mucous plugging or bronchiectasis. Upper Abdomen: Ill-defined wedge-shaped hypoattenuation extending to the spleen upper pole, images 68-76. Appearance is nonspecific but suspect a small splenic infarct. No other acute upper abdominal finding. Musculoskeletal: No acute or abnormal osseous finding. Sternum intact. Review of the MIP images  confirms the above findings. IMPRESSION: Numerous bilateral scattered nodular opacities several with central cavitation compatible with bilateral septic emboli. Associated small pleural effusions Negative for significant acute pulmonary embolus Indeterminate splenic hypodensity superiorly, suspect splenic infarct. Electronically Signed   By: Judie PetitM.  Shick M.D.   On: 07/08/2017 09:00        Scheduled Meds: . cephALEXin  500 mg Oral Q6H  . enoxaparin (LOVENOX) injection  40 mg Subcutaneous QHS  . gabapentin  300 mg Oral TID  . methadone  40 mg Oral Daily  . metoprolol tartrate  25 mg Oral BID   Continuous Infusions:    LOS: 14 days    Time spent: 25 minutes  Greater than 50% of the time spent on counseling and coordinating the care.   Zannie CoveJOSEPH,Miguel Christiana, MD [facility] Pager  [phone]  If 7PM-7AM, please contact night-coverage [email or web address] Password [email or web address] 07/10/2017, 3:04 PM

## 2017-07-10 NOTE — Progress Notes (Signed)
Patient ID: Bryan Jennings, male   DOB: [birth year], 26 y.o.   MRN: [id number]          [facility]    Date of Admission:  06/26/2017   Total days of antibiotics 15        Day 14 cefazolin  Mr. Bryan Jennings was recently hospitalized with MSSA bacteremia. His blood cultures have cleared and there was no evidence of endocarditis on TEE. Partners and I recommended 2 weeks of IV antibiotic therapy which he has now completed. He recently had some chest pain and a CT scan of the chest was ordered which showed multiple small bilateral nodules some with cavitation. These were not seen on his previous CT scan in February. These may be due to recent tricuspid valve endocarditis or bacteremic seeding of the lungs. I recommend changing IV cefazolin to oral cephalexin and treating for 2 more weeks.         Cliffton AstersJohn Ronalda Walpole, MD [facility] [phone] pager   [phone] 07/10/2017, 12:19 PM

## 2017-07-10 NOTE — Progress Notes (Signed)
Discussed w/ pt smoking policy as the smell of cigarette smoke was coming from that area earlier.

## 2017-07-11 LAB — BASIC METABOLIC PANEL
Anion gap: 8 (ref 5–15)
BUN: 16 mg/dL (ref 6–20)
CALCIUM: 9 mg/dL (ref 8.9–10.3)
CHLORIDE: 102 mmol/L (ref 101–111)
CO2: 26 mmol/L (ref 22–32)
CREATININE: 0.74 mg/dL (ref 0.61–1.24)
GFR calc non Af Amer: >60 mL/min (ref >60)
GLUCOSE: 102 mg/dL — AB (ref 65–99)
Potassium: 4.4 mmol/L (ref 3.5–5.1)
Sodium: 136 mmol/L (ref 135–145)

## 2017-07-11 LAB — CBC
HEMATOCRIT: 41.2 % (ref 39.0–52.0)
Hemoglobin: 13.6 g/dL (ref 13.0–17.0)
MCH: 29 pg (ref 26.0–34.0)
MCHC: 33 g/dL (ref 30.0–36.0)
MCV: 87.8 fL (ref 78.0–100.0)
Platelets: 363 10*3/uL (ref 150–400)
RBC: 4.69 MIL/uL (ref 4.22–5.81)
RDW: 13.3 % (ref 11.5–15.5)
WBC: 7.6 10*3/uL (ref 4.0–10.5)

## 2017-07-11 MED ORDER — METOPROLOL TARTRATE 25 MG PO TABS: 25.0000 mg | ORAL_TABLET | Freq: Two times a day (BID) | ORAL | 0 refills | Status: AC

## 2017-07-11 MED ORDER — GABAPENTIN 300 MG PO CAPS: 300.0000 mg | ORAL_CAPSULE | Freq: Three times a day (TID) | ORAL | 0 refills | Status: AC

## 2017-07-11 MED ORDER — CEPHALEXIN 500 MG PO CAPS: 500.0000 mg | ORAL_CAPSULE | Freq: Four times a day (QID) | ORAL | 0 refills | Status: AC

## 2017-07-11 NOTE — Care Management Note (Signed)
Case Management Note  Patient Details  Name: Bryan CageJames Haigler MRN: [id number] Date of Birth: 09/03/1991  Subjective/Objective:    CM asked to assist w/meds-MATCH program-once MATCH program verified-noted patient has used the program from 12/13/16-12/21/16-he [facility] not [facility]-I have spoken to the Lead CM to confirm that we do not provide MATCH program if used w/in 1 calendar year. Provided patient w/good rx discounts  Coupon WUJ:WJXBJY-$7.82,&for:keflex-$5.99,& neurontin-$11.50; lopressor-$4.08. Patient informed,voiced understanding. No further CM needs.            Action/Plan:d/c home.   Expected Discharge Date:  07/11/17               Expected Discharge Plan:  Home/Self Care  In-House Referral:  Clinical Social Work  Discharge planning Services  CM Consult  Post Acute Care Choice:    Choice offered to:     DME Arranged:    DME Agency:     HH Arranged:    HH Agency:     Status of Service:  Completed, signed off  If discussed at [employer] of [employer] Meetings, dates discussed:    Additional Comments:  Lanier ClamMahabir, Mehgan Santmyer, RN 07/11/2017, 12:02 PM

## 2017-07-11 NOTE — Discharge Summary (Signed)
Physician Discharge Summary  Abhijot Straughter ZOX:[id number] DOB: 02-May-1991 DOA: 06/26/2017  PCP: Patient, No Pcp Per  Admit date: 06/26/2017 Discharge date: 07/11/2017  Time spent: 35 minutes  Recommendations for Outpatient Follow-up:  1. COntinue Oral Keflex till 9/19 2. FU with [facility] Methadone clinic tomorrow 3. [facility] in 2 weeks   Discharge Diagnoses:  Principal Problem:   Sepsis due to Staphylococcus aureus [facility]) Active Problems:   Polysubstance dependence including opioid type drug, episodic abuse (HCC)   Essential hypertension   Severe tricuspid valve regurgitation   Bacteremia due to methicillin susceptible Staphylococcus aureus (MSSA)   Septic Pulm embolic  Discharge Condition: stable  Diet recommendation: regular  Filed Weights   06/26/17 1814 06/27/17 0059  Weight: 108.9 kg (240 lb) 98 kg (216 lb)    History of present illness:  26 year old male with medical history of IVDA (heroin) and polysubstance abuse who presented to ED with fevers, chills, body aches in the setting of recent IVDA  Hospital Course:  Sepsis due to MSSA bacteremia (HCC) / Tricuspid regurgitation and septic embolization noted on CT chest 9/4 - Endocarditis ruled out - No vegetation on 2D ECHO or TEE - Seen by ID, repeat Blood Cx negative, completed 2 week course of IV cefazolin 07/10/2017 night, he also had a CT chest 9/4 which was concerning for septic embolization in the lungs which is new compared to CT scan from 12/2016, reviewed these with radiologist on-call and discussed with infectious disease Dr.Campbell, plan to treat for 2 more weeks using oral cephalexin till 9/19  Septic pulmonary emboli -Possibly due to seeding from bacteremia versus recent endocarditis -Longer course of antibiotics as discussed above -No evidence of endocarditis noted on transthoracic and TEE  Polysubstance dependence including opioid type drug, episodic abuse (HCC) - Patient is on methadone  treatment, [facility] [place] prior to admission, methadone was continued inpatient  - Patient was counseled on drug abuse - UDS 8/30 positive for opiates and benzos  - advised to FU tomorrow at [facility] clinic  Hypokalemia - Supplemented  Procedures:  TEE  Consultations: ID  Discharge Exam: Vitals:   07/10/17 2259 07/11/17 0636  BP: (!) 145/89 (!) 99/57  Pulse: (!) 121 78  Resp: 20 20  Temp: 98.8 F (37.1 C) 97.8 F (36.6 C)  SpO2: 99% 98%    General: AAOx3 Cardiovascular: S1S2/RRR Respiratory: CTAB  Discharge Instructions   Discharge Instructions    Diet - low sodium heart healthy    Complete by:  As directed    Increase activity slowly    Complete by:  As directed      Discharge Medication List as of 07/11/2017 11:04 AM    START taking these medications   Details  cephALEXin (KEFLEX) 500 MG capsule Take 1 capsule (500 mg total) by mouth every 6 (six) hours., Starting Thu 07/11/2017, Print      CONTINUE these medications which have CHANGED   Details  gabapentin (NEURONTIN) 300 MG capsule Take 1 capsule (300 mg total) by mouth 3 (three) times daily., Starting Thu 07/11/2017, Print    metoprolol tartrate (LOPRESSOR) 25 MG tablet Take 1 tablet (25 mg total) by mouth 2 (two) times daily., Starting Thu 07/11/2017, Print      CONTINUE these medications which have NOT CHANGED   Details  acetaminophen (TYLENOL) 325 MG tablet Take 650 mg by mouth every 6 (six) hours as needed for mild pain, moderate pain, fever or headache., Historical  Med    methadone (DOLOPHINE) 10 MG tablet Take 30 mg by mouth daily. , Historical Med      STOP taking these medications     ALPRAZolam (XANAX) 0.25 MG tablet      doxycycline (VIBRA-TABS) 100 MG tablet        No Known Allergies Follow-up Information    Cliffton Astersampbell, John, MD. Schedule an appointment as soon as possible for a visit in 2 week(s).   Specialty:  Infectious  Diseases Contact information: [address] [place] [address] [phone]            The results of significant diagnostics from this hospitalization (including imaging, microbiology, ancillary and laboratory) are listed below for reference.    Significant Diagnostic Studies: Dg Chest 2 View  Result Date: 06/26/2017 CLINICAL DATA:  Shortness of breath, fever. EXAM: CHEST  2 VIEW COMPARISON:  None. FINDINGS: The heart size and mediastinal contours are within normal limits. No pneumothorax or pleural effusion is noted. Left lung is clear. Mild right basilar subsegmental atelectasis is noted. The visualized skeletal structures are unremarkable. IMPRESSION: Mild right basilar subsegmental atelectasis. Electronically Signed   By: Lupita RaiderJames  Green Jr, M.D.   On: 06/26/2017 21:10   Ct Angio Chest Pe W Or Wo Contrast  Result Date: 07/08/2017 CLINICAL DATA:  Acute left chest pain, positive D-dimer, previous right thoracotomy for empyema October 2017 EXAM: CT ANGIOGRAPHY CHEST WITH CONTRAST TECHNIQUE: Multidetector CT imaging of the chest was performed using the standard protocol during bolus administration of intravenous contrast. Multiplanar CT image reconstructions and MIPs were obtained to evaluate the vascular anatomy. CONTRAST:  77 cc Isovue 370 COMPARISON:  06/26/2017 chest x-ray FINDINGS: Cardiovascular: No significant acute pulmonary embolus or filling defect within the central and proximal pulmonary arterial vasculature. Intact thoracic aorta. No aneurysm or dissection. No mediastinal hemorrhage or hematoma. Normal heart size. No pericardial effusion. Mediastinum/Nodes: Numerous small nonenlarged mediastinal and hilar lymph nodes. These all have short axis measurements of 7 mm or less. No significant adenopathy. Thyroid, trachea and esophagus unremarkable. Lungs/Pleura: Numerous scattered peripheral/subpleural nodular opacities throughout both lungs. Several of the nodules in the  right upper lobe, right middle lobe, and left lower lobe have central cavitation. Findings most compatible with septic emboli. Small dependent pleural effusions, slightly larger on the left. No other pleural abnormality or pneumothorax. Trachea and central airways are patent. No mucous plugging or bronchiectasis. Upper Abdomen: Ill-defined wedge-shaped hypoattenuation extending to the spleen upper pole, images 68-76. Appearance is nonspecific but suspect a small splenic infarct. No other acute upper abdominal finding. Musculoskeletal: No acute or abnormal osseous finding. Sternum intact. Review of the MIP images confirms the above findings. IMPRESSION: Numerous bilateral scattered nodular opacities several with central cavitation compatible with bilateral septic emboli. Associated small pleural effusions Negative for significant acute pulmonary embolus Indeterminate splenic hypodensity superiorly, suspect splenic infarct. Electronically Signed   By: Judie PetitM.  Shick M.D.   On: 07/08/2017 09:00    Microbiology: No results found for this or any previous visit (from the past 240 hour(s)).   Labs: Basic Metabolic Panel:  Recent Labs Lab 07/05/17 0612 07/09/17 0934 07/11/17 0642  NA 138 133* 136  K 4.5 4.5 4.4  CL 102 98* 102  CO2 29 27 26   GLUCOSE 96 99 102*  BUN 10 14 16   CREATININE 0.77 0.71 0.74  CALCIUM 9.2 9.1 9.0   Liver Function Tests: No results for input(s): AST, ALT, ALKPHOS, BILITOT, PROT, ALBUMIN in the last 168  hours. No results for input(s): LIPASE, AMYLASE in the last 168 hours. No results for input(s): AMMONIA in the last 168 hours. CBC:  Recent Labs Lab 07/05/17 0612 07/09/17 0934 07/11/17 0642  WBC 7.7 7.5 7.6  HGB 13.6 14.7 13.6  HCT 40.6 43.1 41.2  MCV 85.8 85.5 87.8  PLT 387 339 363   Cardiac Enzymes:  Recent Labs Lab 07/08/17 0924 07/09/17 0934  TROPONINI <0.03 <0.03   BNP: BNP (last 3 results)  Recent Labs  08/21/16 2023  BNP 81.9    ProBNP (last 3  results) No results for input(s): PROBNP in the last 8760 hours.  CBG: No results for input(s): GLUCAP in the last 168 hours.     SignedZannie Cove MD.  [facility] 07/11/2017, 3:46 PM

## 2017-12-01 IMAGING — CT CT ANGIO CHEST
2 of 8 series · 18 of 46 positions shown · IV contrast (isovue)
Comparison: Chest radiograph dated 08/21/2016

CLINICAL DATA: 24-year-old male with tachycardia and tachypnea.

EXAM:
CT ANGIOGRAPHY CHEST WITH CONTRAST
TECHNIQUE: Multidetector CT imaging of the chest was performed using the
standard protocol during bolus administration of intravenous
contrast. Multiplanar CT image reconstructions and MIPs were
obtained to evaluate the vascular anatomy.
CONTRAST:  80 cc Isovue 370

[Series 5: thins · axial · 0.67mm/px · z∈[-140,+81]mm · 15 of 245 slices shown]
[im 12/245  lung]
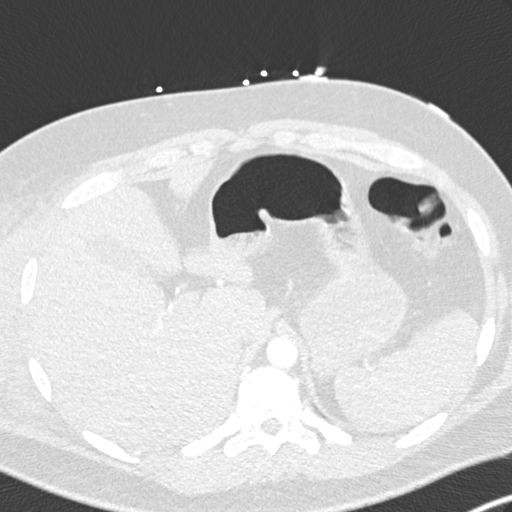
[im 34/245  soft-tissue]
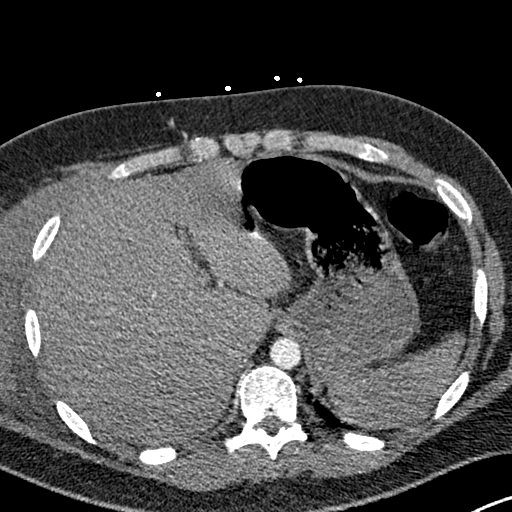
[im 45/245  lung]
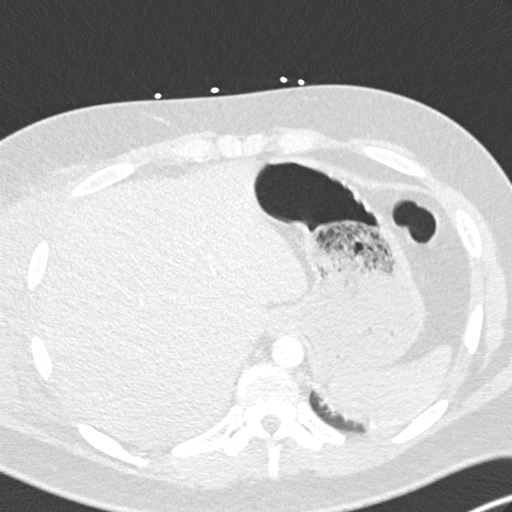
[im 56/245  soft-tissue]
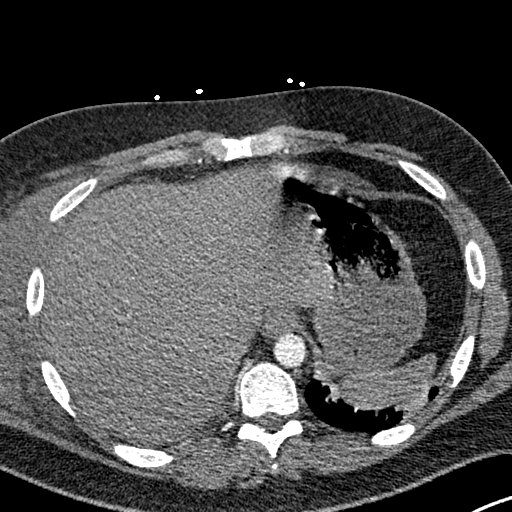
[im 78/245  lung]
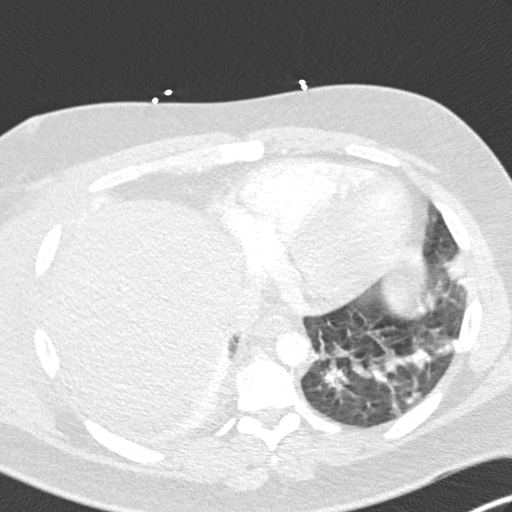
[im 89/245  soft-tissue]
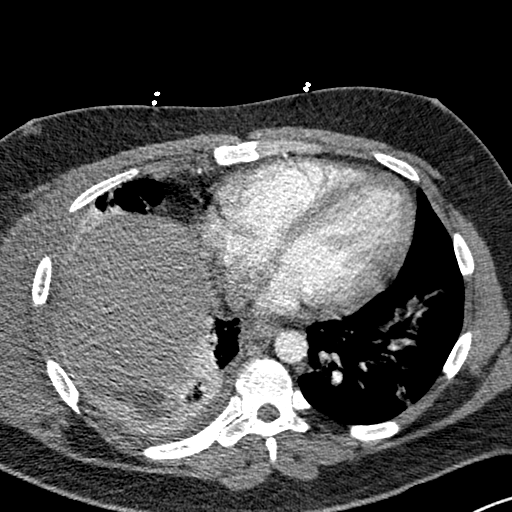
[im 111/245  lung]
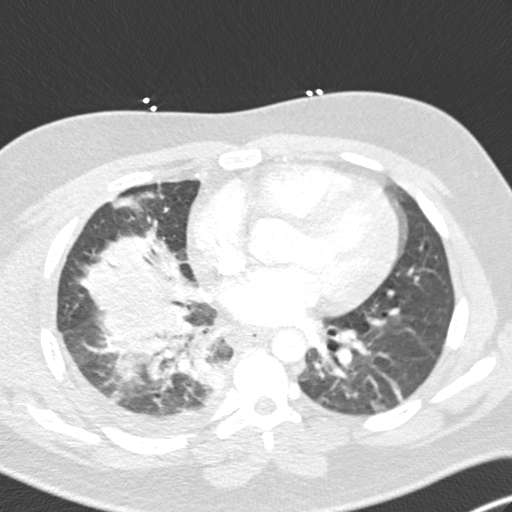
[im 123/245  soft-tissue]
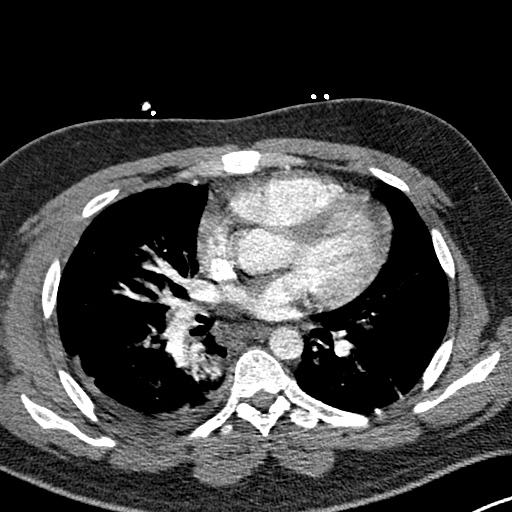
[im 134/245  lung]
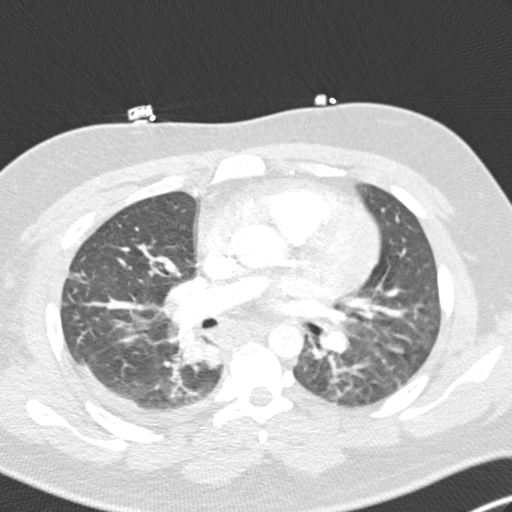
[im 156/245  soft-tissue]
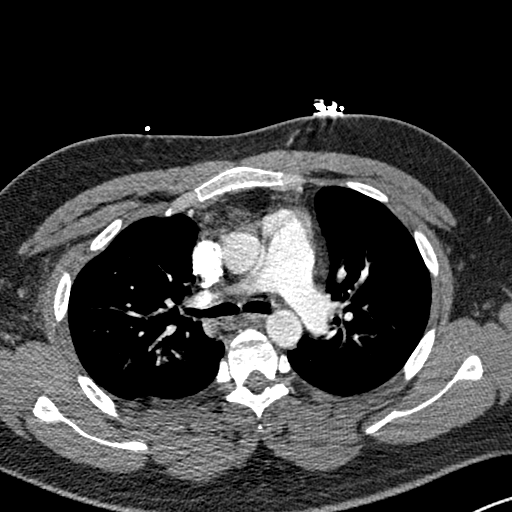
[im 167/245  lung]
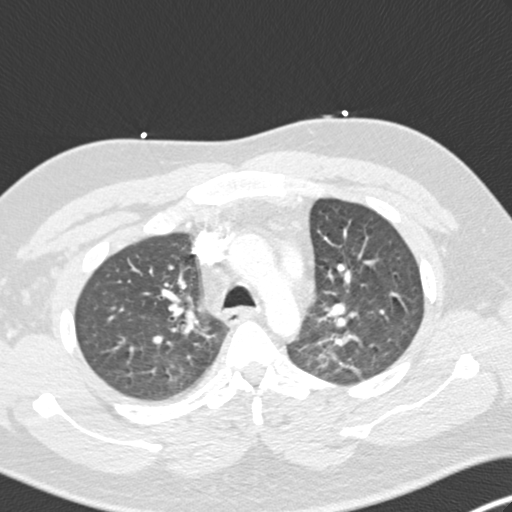
[im 189/245  soft-tissue]
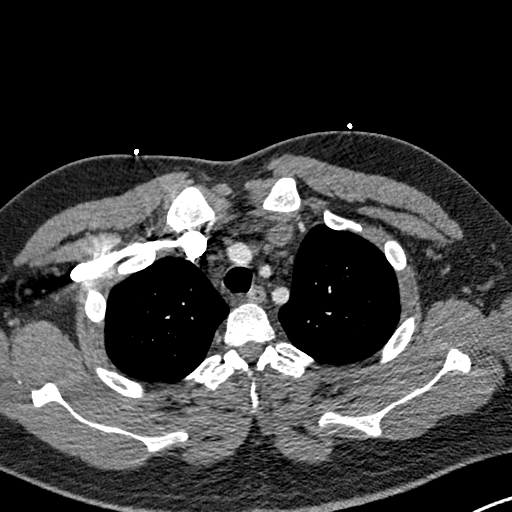
[im 200/245  lung]
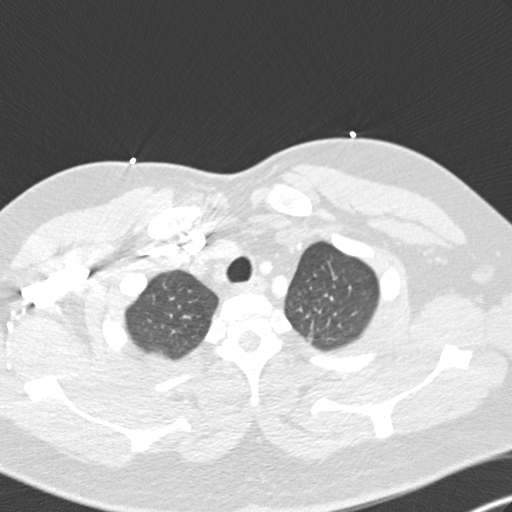
[im 211/245  soft-tissue]
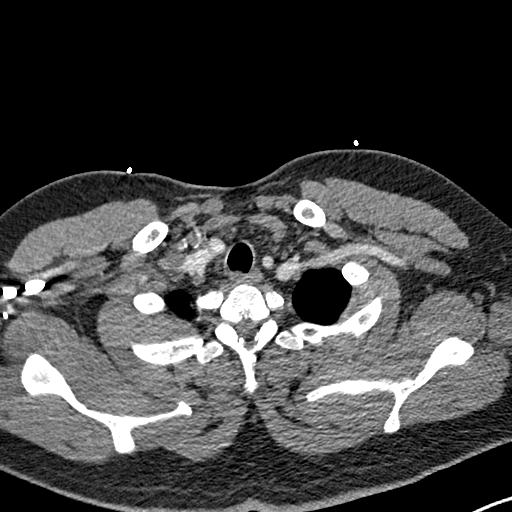
[im 233/245  lung]
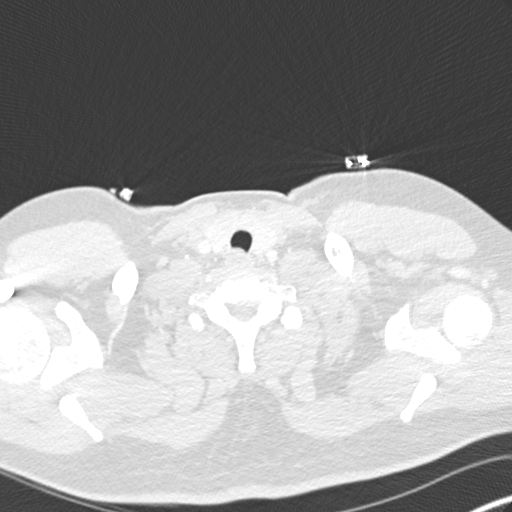

[Series 7: coronal mpr · coronal · 0.59mm/px · 3 of 142 slices shown]
[im 36/142  soft-tissue]
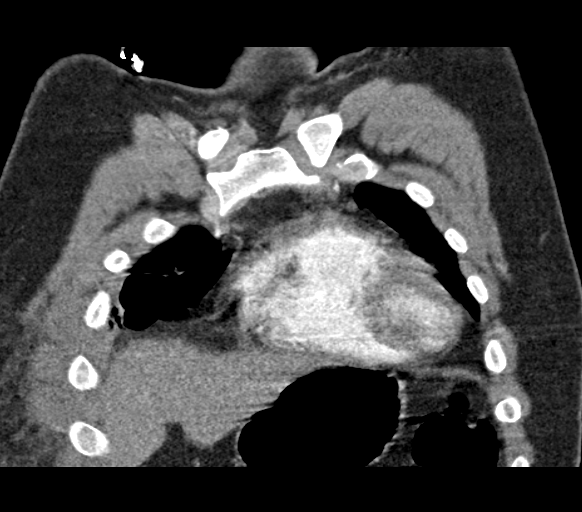
[im 71/142  soft-tissue]
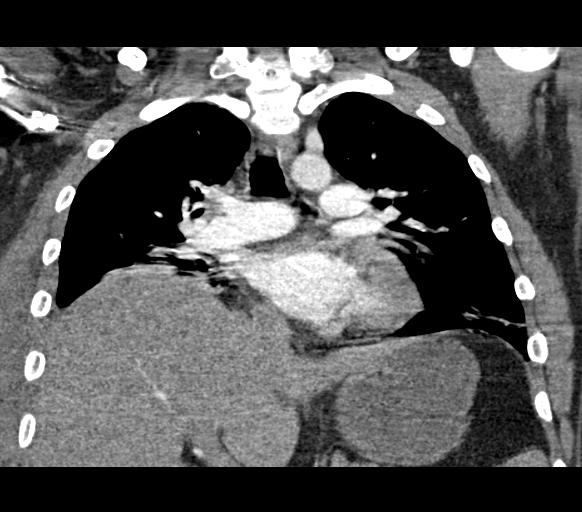
[im 106/142  soft-tissue]
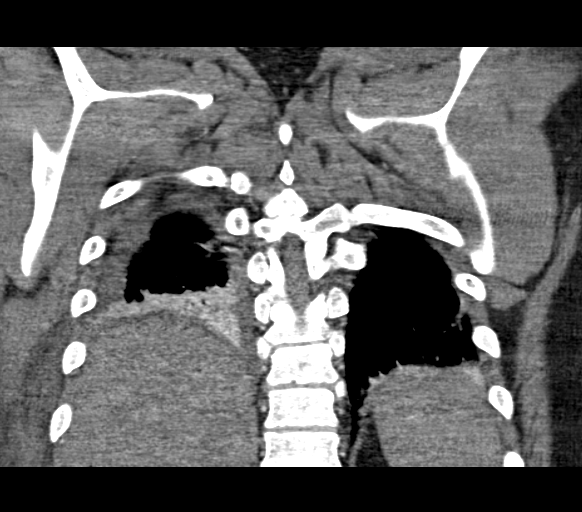

[18 of 46 positions shown; findings below may reference images not displayed]

FINDINGS: Evaluation of this exam is limited due to respiratory motion
artifact.

Cardiovascular: The thoracic aorta appears unremarkable. No
dissection or aneurysm. The origins of the great vessels of the
aortic arch appear patent. Evaluation of the pulmonary arteries is
limited due to respiratory motion artifact. No definite central
pulmonary artery embolus identified. Top-normal cardiac size. No
pericardial effusion.

Mediastinum/Nodes: There is no hilar or mediastinal adenopathy. The
esophagus is grossly unremarkable. No thyroid nodules identified.

Lungs/Pleura: There is a small right pleural effusion. Patchy area
of consolidative change involving the right lung base in the right
middle and right lower lobe may represent atelectasis versus
pneumonia. Left lung base linear and hazy densities may be
atelectatic changes or infiltrate. There is no pleural effusion on
the left. No pneumothorax. The central airways are patent.

Upper Abdomen: No acute abnormality.

Musculoskeletal: No chest wall abnormality. No acute or significant
osseous findings.

Review of the MIP images confirms the above findings.
IMPRESSION: Limited evaluation for pulmonary embolism due to respiratory motion
artifact. No definite central pulmonary artery embolus identified.

Small right pleural effusion with right lung base atelectasis versus
pneumonia. Small patchy left lung base density may represent
atelectatic changes versus infiltrate. Clinical correlation and
follow-up to resolution recommended.
